# Patient Record
Sex: Female | Born: 1935
Health system: Southern US, Community
[De-identification: ages and names within clinical notes are randomized; demographics above are authoritative.]

## PROBLEM LIST (undated history)

## (undated) DIAGNOSIS — R112 Nausea with vomiting, unspecified: Secondary | ICD-10-CM

## (undated) DIAGNOSIS — I10 Essential (primary) hypertension: Secondary | ICD-10-CM

## (undated) DIAGNOSIS — K625 Hemorrhage of anus and rectum: Secondary | ICD-10-CM

## (undated) DIAGNOSIS — N318 Other neuromuscular dysfunction of bladder: Secondary | ICD-10-CM

## (undated) DIAGNOSIS — I712 Thoracic aortic aneurysm, without rupture, unspecified: Secondary | ICD-10-CM

## (undated) DIAGNOSIS — M159 Polyosteoarthritis, unspecified: Secondary | ICD-10-CM

## (undated) DIAGNOSIS — Z972 Presence of dental prosthetic device (complete) (partial): Secondary | ICD-10-CM

## (undated) DIAGNOSIS — Z973 Presence of spectacles and contact lenses: Secondary | ICD-10-CM

## (undated) DIAGNOSIS — I4819 Other persistent atrial fibrillation: Secondary | ICD-10-CM

## (undated) DIAGNOSIS — Z8709 Personal history of other diseases of the respiratory system: Secondary | ICD-10-CM

## (undated) DIAGNOSIS — R32 Unspecified urinary incontinence: Secondary | ICD-10-CM

## (undated) DIAGNOSIS — Z8489 Family history of other specified conditions: Secondary | ICD-10-CM

## (undated) DIAGNOSIS — Z9889 Other specified postprocedural states: Secondary | ICD-10-CM

## (undated) DIAGNOSIS — I495 Sick sinus syndrome: Secondary | ICD-10-CM

## (undated) DIAGNOSIS — K219 Gastro-esophageal reflux disease without esophagitis: Secondary | ICD-10-CM

## (undated) DIAGNOSIS — I272 Pulmonary hypertension, unspecified: Secondary | ICD-10-CM

## (undated) DIAGNOSIS — I2510 Atherosclerotic heart disease of native coronary artery without angina pectoris: Secondary | ICD-10-CM

## (undated) DIAGNOSIS — K851 Biliary acute pancreatitis without necrosis or infection: Secondary | ICD-10-CM

## (undated) DIAGNOSIS — C649 Malignant neoplasm of unspecified kidney, except renal pelvis: Secondary | ICD-10-CM

## (undated) DIAGNOSIS — E7800 Pure hypercholesterolemia, unspecified: Secondary | ICD-10-CM

## (undated) DIAGNOSIS — J309 Allergic rhinitis, unspecified: Secondary | ICD-10-CM

## (undated) HISTORY — DX: Polyosteoarthritis, unspecified: M15.9

## (undated) HISTORY — DX: Hemorrhage of anus and rectum: K62.5

## (undated) HISTORY — PX: TUBAL LIGATION: SHX77

## (undated) HISTORY — PX: ABDOMINAL HYSTERECTOMY: SHX81

## (undated) HISTORY — DX: Other neuromuscular dysfunction of bladder: N31.8

## (undated) HISTORY — DX: Biliary acute pancreatitis without necrosis or infection: K85.10

## (undated) HISTORY — DX: Other persistent atrial fibrillation: I48.19

## (undated) HISTORY — DX: Sick sinus syndrome: I49.5

## (undated) HISTORY — PX: COLONOSCOPY: SHX174

## (undated) HISTORY — DX: Thoracic aortic aneurysm, without rupture, unspecified: I71.20

## (undated) HISTORY — DX: Malignant neoplasm of unspecified kidney, except renal pelvis: C64.9

## (undated) HISTORY — DX: Thoracic aortic aneurysm, without rupture: I71.2

## (undated) HISTORY — PX: VEIN LIGATION: SHX2652

## (undated) HISTORY — PX: TOTAL ABDOMINAL HYSTERECTOMY W/ BILATERAL SALPINGOOPHORECTOMY: SHX83

## (undated) HISTORY — DX: Allergic rhinitis, unspecified: J30.9

## (undated) HISTORY — DX: Pulmonary hypertension, unspecified: I27.20

## (undated) HISTORY — DX: Pure hypercholesterolemia, unspecified: E78.00

## (undated) HISTORY — DX: Gastro-esophageal reflux disease without esophagitis: K21.9

## (undated) HISTORY — DX: Atherosclerotic heart disease of native coronary artery without angina pectoris: I25.10

## (undated) HISTORY — DX: Essential (primary) hypertension: I10

---

## 1999-01-07 ENCOUNTER — Ambulatory Visit (HOSPITAL_BASED_OUTPATIENT_CLINIC_OR_DEPARTMENT_OTHER): Admission: RE | Admit: 1999-01-07 | Discharge: 1999-01-07 | Payer: Self-pay | Admitting: Orthopedic Surgery

## 2000-07-26 ENCOUNTER — Encounter: Admission: RE | Admit: 2000-07-26 | Discharge: 2000-07-26 | Payer: Self-pay | Admitting: Family Medicine

## 2000-07-26 ENCOUNTER — Encounter: Payer: Self-pay | Admitting: Family Medicine

## 2001-07-19 ENCOUNTER — Encounter: Payer: Self-pay | Admitting: Family Medicine

## 2001-07-19 ENCOUNTER — Encounter: Admission: RE | Admit: 2001-07-19 | Discharge: 2001-07-19 | Payer: Self-pay | Admitting: Family Medicine

## 2002-03-01 ENCOUNTER — Encounter: Admission: RE | Admit: 2002-03-01 | Discharge: 2002-03-01 | Payer: Self-pay | Admitting: Family Medicine

## 2002-03-01 ENCOUNTER — Encounter: Payer: Self-pay | Admitting: Family Medicine

## 2003-03-18 ENCOUNTER — Encounter: Admission: RE | Admit: 2003-03-18 | Discharge: 2003-03-18 | Payer: Self-pay | Admitting: Family Medicine

## 2003-09-24 ENCOUNTER — Encounter: Admission: RE | Admit: 2003-09-24 | Discharge: 2003-09-24 | Payer: Self-pay | Admitting: Family Medicine

## 2004-09-12 ENCOUNTER — Encounter: Admission: RE | Admit: 2004-09-12 | Discharge: 2004-09-12 | Payer: Self-pay | Admitting: Family Medicine

## 2005-03-22 HISTORY — PX: THUMB ARTHROSCOPY: SHX2509

## 2005-03-22 HISTORY — PX: PACEMAKER INSERTION: SHX728

## 2005-03-22 HISTORY — PX: LAPAROSCOPIC CHOLECYSTECTOMY: SUR755

## 2005-03-25 ENCOUNTER — Inpatient Hospital Stay (HOSPITAL_COMMUNITY): Admission: AD | Admit: 2005-03-25 | Discharge: 2005-04-05 | Payer: Self-pay | Admitting: Internal Medicine

## 2005-03-25 ENCOUNTER — Encounter: Payer: Self-pay | Admitting: *Deleted

## 2005-03-29 ENCOUNTER — Encounter (INDEPENDENT_AMBULATORY_CARE_PROVIDER_SITE_OTHER): Payer: Self-pay | Admitting: Cardiology

## 2005-03-30 ENCOUNTER — Encounter (INDEPENDENT_AMBULATORY_CARE_PROVIDER_SITE_OTHER): Payer: Self-pay | Admitting: *Deleted

## 2005-05-18 ENCOUNTER — Ambulatory Visit (HOSPITAL_COMMUNITY): Admission: RE | Admit: 2005-05-18 | Discharge: 2005-05-18 | Payer: Self-pay | Admitting: Cardiology

## 2005-06-01 ENCOUNTER — Ambulatory Visit (HOSPITAL_COMMUNITY): Admission: RE | Admit: 2005-06-01 | Discharge: 2005-06-02 | Payer: Self-pay | Admitting: Cardiology

## 2005-07-12 ENCOUNTER — Encounter: Admission: RE | Admit: 2005-07-12 | Discharge: 2005-07-12 | Payer: Self-pay | Admitting: Family Medicine

## 2005-09-28 ENCOUNTER — Emergency Department (HOSPITAL_COMMUNITY): Admission: EM | Admit: 2005-09-28 | Discharge: 2005-09-28 | Payer: Self-pay | Admitting: Emergency Medicine

## 2006-01-18 ENCOUNTER — Ambulatory Visit (HOSPITAL_COMMUNITY): Admission: RE | Admit: 2006-01-18 | Discharge: 2006-01-18 | Payer: Self-pay | Admitting: Cardiology

## 2006-02-09 ENCOUNTER — Encounter: Admission: RE | Admit: 2006-02-09 | Discharge: 2006-02-09 | Payer: Self-pay | Admitting: Family Medicine

## 2006-06-02 ENCOUNTER — Inpatient Hospital Stay (HOSPITAL_COMMUNITY): Admission: AD | Admit: 2006-06-02 | Discharge: 2006-06-06 | Payer: Self-pay | Admitting: Cardiology

## 2008-03-21 ENCOUNTER — Encounter: Admission: RE | Admit: 2008-03-21 | Discharge: 2008-03-21 | Payer: Self-pay | Admitting: Family Medicine

## 2010-03-22 HISTORY — PX: OTHER SURGICAL HISTORY: SHX169

## 2010-04-03 ENCOUNTER — Inpatient Hospital Stay (HOSPITAL_COMMUNITY)
Admission: RE | Admit: 2010-04-03 | Discharge: 2010-04-06 | Payer: Self-pay | Source: Home / Self Care | Attending: Orthopedic Surgery | Admitting: Orthopedic Surgery

## 2010-04-06 LAB — DIFFERENTIAL
Basophils Absolute: 0 10*3/uL (ref 0.0–0.1)
Basophils Relative: 1 % (ref 0–1)
Eosinophils Absolute: 0.1 10*3/uL (ref 0.0–0.7)
Eosinophils Relative: 2 % (ref 0–5)
Lymphocytes Relative: 30 % (ref 12–46)
Lymphs Abs: 1.9 10*3/uL (ref 0.7–4.0)
Monocytes Absolute: 0.6 10*3/uL (ref 0.1–1.0)
Monocytes Relative: 9 % (ref 3–12)
Neutro Abs: 3.8 10*3/uL (ref 1.7–7.7)
Neutrophils Relative %: 59 % (ref 43–77)

## 2010-04-06 LAB — BASIC METABOLIC PANEL
BUN: 10 mg/dL (ref 6–23)
BUN: 12 mg/dL (ref 6–23)
CO2: 26 mEq/L (ref 19–32)
CO2: 26 mEq/L (ref 19–32)
Calcium: 8.7 mg/dL (ref 8.4–10.5)
Calcium: 9.1 mg/dL (ref 8.4–10.5)
Chloride: 107 mEq/L (ref 96–112)
Chloride: 108 mEq/L (ref 96–112)
Creatinine, Ser: 0.77 mg/dL (ref 0.4–1.2)
Creatinine, Ser: 0.8 mg/dL (ref 0.4–1.2)
GFR calc Af Amer: >60 mL/min (ref >60)
GFR calc Af Amer: >60 mL/min (ref >60)
GFR calc non Af Amer: >60 mL/min (ref >60)
GFR calc non Af Amer: >60 mL/min (ref >60)
Glucose, Bld: 133 mg/dL — ABNORMAL HIGH (ref 70–99)
Glucose, Bld: 115 mg/dL — ABNORMAL HIGH (ref 70–99)
Potassium: 4.1 mEq/L (ref 3.5–5.1)
Potassium: 3.5 mEq/L (ref 3.5–5.1)
Sodium: 138 mEq/L (ref 135–145)
Sodium: 139 mEq/L (ref 135–145)

## 2010-04-06 LAB — URINALYSIS, ROUTINE W REFLEX MICROSCOPIC
Bilirubin Urine: NEGATIVE
Bilirubin Urine: NEGATIVE
Ketones, ur: NEGATIVE mg/dL
Ketones, ur: NEGATIVE mg/dL
Nitrite: NEGATIVE
Nitrite: NEGATIVE
Protein, ur: NEGATIVE mg/dL
Protein, ur: NEGATIVE mg/dL
Specific Gravity, Urine: 1.015 (ref 1.005–1.030)
Specific Gravity, Urine: 1.009 (ref 1.005–1.030)
Urine Glucose, Fasting: NEGATIVE mg/dL
Urine Glucose, Fasting: NEGATIVE mg/dL
Urobilinogen, UA: 0.2 mg/dL (ref 0.0–1.0)
Urobilinogen, UA: 0.2 mg/dL (ref 0.0–1.0)
pH: 5.5 (ref 5.0–8.0)
pH: 6.5 (ref 5.0–8.0)

## 2010-04-06 LAB — CBC
HCT: 39.3 % (ref 36.0–46.0)
HCT: 30.5 % — ABNORMAL LOW (ref 36.0–46.0)
HCT: 27.9 % — ABNORMAL LOW (ref 36.0–46.0)
Hemoglobin: 13.6 g/dL (ref 12.0–15.0)
Hemoglobin: 10.5 g/dL — ABNORMAL LOW (ref 12.0–15.0)
Hemoglobin: 9.7 g/dL — ABNORMAL LOW (ref 12.0–15.0)
MCH: 31 pg (ref 26.0–34.0)
MCH: 31 pg (ref 26.0–34.0)
MCH: 31.1 pg (ref 26.0–34.0)
MCHC: 34.6 g/dL (ref 30.0–36.0)
MCHC: 34.4 g/dL (ref 30.0–36.0)
MCHC: 34.8 g/dL (ref 30.0–36.0)
MCV: 89.5 fL (ref 78.0–100.0)
MCV: 90 fL (ref 78.0–100.0)
MCV: 89.4 fL (ref 78.0–100.0)
Platelets: 127 10*3/uL — ABNORMAL LOW (ref 150–400)
Platelets: 120 10*3/uL — ABNORMAL LOW (ref 150–400)
Platelets: 105 10*3/uL — ABNORMAL LOW (ref 150–400)
RBC: 4.39 MIL/uL (ref 3.87–5.11)
RBC: 3.39 MIL/uL — ABNORMAL LOW (ref 3.87–5.11)
RBC: 3.12 MIL/uL — ABNORMAL LOW (ref 3.87–5.11)
RDW: 14.5 % (ref 11.5–15.5)
RDW: 14.5 % (ref 11.5–15.5)
RDW: 14.4 % (ref 11.5–15.5)
WBC: 6.4 10*3/uL (ref 4.0–10.5)
WBC: 10.6 10*3/uL — ABNORMAL HIGH (ref 4.0–10.5)
WBC: 10.2 10*3/uL (ref 4.0–10.5)

## 2010-04-06 LAB — PROTIME-INR
INR: 2.11 — ABNORMAL HIGH (ref 0.00–1.49)
INR: 1.05 (ref 0.00–1.49)
INR: 1.27 (ref 0.00–1.49)
INR: 1.57 — ABNORMAL HIGH (ref 0.00–1.49)
Prothrombin Time: 23.8 seconds — ABNORMAL HIGH (ref 11.6–15.2)
Prothrombin Time: 13.9 seconds (ref 11.6–15.2)
Prothrombin Time: 16.1 seconds — ABNORMAL HIGH (ref 11.6–15.2)
Prothrombin Time: 19 seconds — ABNORMAL HIGH (ref 11.6–15.2)

## 2010-04-06 LAB — COMPREHENSIVE METABOLIC PANEL
ALT: 13 U/L (ref 0–35)
AST: 18 U/L (ref 0–37)
Albumin: 3.9 g/dL (ref 3.5–5.2)
Alkaline Phosphatase: 65 U/L (ref 39–117)
BUN: 11 mg/dL (ref 6–23)
CO2: 25 mEq/L (ref 19–32)
Calcium: 9.5 mg/dL (ref 8.4–10.5)
Chloride: 110 mEq/L (ref 96–112)
Creatinine, Ser: 0.75 mg/dL (ref 0.4–1.2)
GFR calc Af Amer: >60 mL/min (ref >60)
GFR calc non Af Amer: >60 mL/min (ref >60)
Glucose, Bld: 99 mg/dL (ref 70–99)
Potassium: 3.8 mEq/L (ref 3.5–5.1)
Sodium: 141 mEq/L (ref 135–145)
Total Bilirubin: 1 mg/dL (ref 0.3–1.2)
Total Protein: 6.6 g/dL (ref 6.0–8.3)

## 2010-04-06 LAB — APTT
aPTT: 43 seconds — ABNORMAL HIGH (ref 24–37)
aPTT: 31 seconds (ref 24–37)

## 2010-04-06 LAB — SURGICAL PCR SCREEN
MRSA, PCR: NEGATIVE
Staphylococcus aureus: NEGATIVE

## 2010-04-06 LAB — URINE MICROSCOPIC-ADD ON

## 2010-04-06 LAB — TYPE AND SCREEN
ABO/RH(D): A POS
Antibody Screen: NEGATIVE

## 2010-04-06 LAB — ABO/RH: ABO/RH(D): A POS

## 2010-04-08 LAB — URINE CULTURE
Colony Count: >=100000
Culture  Setup Time: 201201160040

## 2010-04-08 LAB — BASIC METABOLIC PANEL
BUN: 13 mg/dL (ref 6–23)
CO2: 27 mEq/L (ref 19–32)
Calcium: 8.7 mg/dL (ref 8.4–10.5)
Chloride: 102 mEq/L (ref 96–112)
Creatinine, Ser: 0.84 mg/dL (ref 0.4–1.2)
GFR calc Af Amer: >60 mL/min (ref >60)
GFR calc non Af Amer: >60 mL/min (ref >60)
Glucose, Bld: 118 mg/dL — ABNORMAL HIGH (ref 70–99)
Potassium: 3.3 mEq/L — ABNORMAL LOW (ref 3.5–5.1)
Sodium: 134 mEq/L — ABNORMAL LOW (ref 135–145)

## 2010-04-08 LAB — CBC
HCT: 25.5 % — ABNORMAL LOW (ref 36.0–46.0)
Hemoglobin: 8.8 g/dL — ABNORMAL LOW (ref 12.0–15.0)
MCH: 30.7 pg (ref 26.0–34.0)
MCHC: 34.5 g/dL (ref 30.0–36.0)
MCV: 88.9 fL (ref 78.0–100.0)
Platelets: 103 10*3/uL — ABNORMAL LOW (ref 150–400)
RBC: 2.87 MIL/uL — ABNORMAL LOW (ref 3.87–5.11)
RDW: 14.4 % (ref 11.5–15.5)
WBC: 7.7 10*3/uL (ref 4.0–10.5)

## 2010-04-08 LAB — PROTIME-INR
INR: 2.09 — ABNORMAL HIGH (ref 0.00–1.49)
Prothrombin Time: 23.6 seconds — ABNORMAL HIGH (ref 11.6–15.2)

## 2010-05-05 NOTE — Discharge Summary (Signed)
Molly Taylor, Molly Taylor             ACCOUNT NO.:  [id number]  MEDICAL RECORD NO.:  [id number]          PATIENT TYPE:  INP  LOCATION:  5025                         FACILITY:  [facility]  PHYSICIAN:  Dyke Brackett, M.D.    DATE OF BIRTH:  November 19, 1926  DATE OF ADMISSION:  04/03/2010 DATE OF DISCHARGE:  04/06/2010                              DISCHARGE SUMMARY   DIAGNOSIS:  Left knee end-stage osteoarthritis.  SECONDARY DIAGNOSES: 1. Atrial fibrillation. 2. Hypertension. 3. History of pacemaker.  PROCEDURE IN HOSPITAL:  Left total knee arthroplasty.  HISTORY OF PRESENT ILLNESS:  The patient is an 77 year old with a long history of left knee pain.  Preoperative history and physical reveals her to have a history of knee pain for many years that had been treated with conservative measures including anti-inflammatories, steroid injection, physical therapy, rest, and pain medicines, none of which seemed to have been relieving her symptoms here in the last several months.  Her pain has worsened to the point where she is not ambulating safely and is interfering with daily living including sleep.  After discussing risks and benefits of the surgery, the patient wished to proceed with a total knee arthroplasty.  MEDICAL HISTORY:  Significant for history of atrial fibrillation and pacemaker, as well as hypertension.  SURGICAL HISTORY:  Positive for cholecystectomy in 2007, no difficulties with general endotracheal anesthesia.  REVIEW OF SYSTEMS:  Positive for cataracts, glasses, partial dentures, occasional palpitations, history of hemorrhoids, and easy bruising.  ADMISSION MEDICATIONS:  Warfarin which she takes on a chronic basis but discontinued 5 days prior to her admission, sotalol, loratadine, diltiazem, Centrum Silver, Tylenol, and cod liver oil.  ALLERGIES:  The patient is allergic to CODEINE which causes nausea.  SOCIAL HISTORY:  The patient does not use tobacco.  She does not  use alcohol.  She is married and lives with her husband in a one-story residence with several children and other family nearby.  FAMILY HISTORY:  Positive for hypertension and diabetes in her mother, as well as diabetes in 2 siblings.  PHYSICAL EXAMINATION:  VITAL SIGNS:  Preoperative history and physical showed her to have a temperature of 97.3, pulse of 61, blood pressure of 124/78. HEAD:  Normocephalic, atraumatic. EARS:  TMs clear. EYES: Pupils equal, round, and reactive to light and accommodation. NOSE:  Patent. THROAT:  Benign. NECK:  Supple.  Full range of motion.  No bruits. CHEST/LUNGS: Clear to auscultation and percussion. CARDIAC: Regular rate and rhythm. ABDOMEN:  Soft, nontender and nondistended.  Positive bowel sounds. CNS:  Within normal limits. SKIN:  Normal limits without rash. EXTREMITIES:  Examination of left knee shows no ligamentous laxity.  MCL and LCL were intact.  The patient has full range of motion about the hip.  Pain with all range of motion about the knee with positive crepitus.  PREOPERATIVE LABS:  CBC, CMET, EKG, PT and PTT, as well as chest x-ray were all within normal limits.  On her initial preoperative labs her PT and INR were high but came to within normal limits after stopping Coumadin for 5 days prior.  HOSPITAL COURSE:  On the day of  admission the patient was taken to the operating room at [facility] where she underwent a left total knee arthroplasty using Sigma DePuy cemented components, size 3 tibia, size 3 femur, 10-mm bearing, and 32-mm 3-peg poly.  Patient was placed on perioperative antibiotics.  She was placed on postoperative Coumadin, prophylaxis with Lovenox to bridge until she became therapeutic.  She was placed on DVT prophylaxis with mechanical measures as well including foot pumps and TED hose, as well as early ambulation and CPM use. Physical therapy was begun the first postoperative day and a process to have her placed  in a skilled bed which was her preference was begun immediately postoperatively.  On postoperative day #1 the patient was doing well.  The patient was without complaint other than moderate pain. Vitals were stable.  Hemoglobin of 10.5, WBC of 10.6.  Dressing was dry. Calf was soft and nontender.  Physical therapy continued to make slow but steady progress in transfers and ambulation.  Postoperative day 2 the patient was doing well.  Vital signs were stable.  Hemoglobin 9.7. Wound looked good on dressing change.  Staples were intact.  No sign of infection.  Hemovac was discontinued.  Calf was nontender and she was otherwise orthopedically stable and awaiting skilled nursing home placement.  IV and Foley were discontinued.  Coumadin was continued as she was not yet therapeutic and so Lovenox was continued one more day. Postoperative day 3 the patient was without complaint, was making good progress, was medically stable and orthopedically improved.  She was tolerating her CPM without difficulty and was making progress in learning safe ambulation and transfers but was still requiring assist on both.  Wound was clean and dry.  No sign of infection.  The patient's chest was clear to auscultation, she had good bowel sounds and had a small bowel movement the day before.  She was voiding without difficulty after having some minimal urinary retention immediately post Foley removal.  She was considered to be ready for transfer to a skilled nursing bed and a bed offer did become available at [facility] so she was transferred there on the third postoperative day.  At the time of her discharge her activities were weightbearing as tolerated, using a walker for all ambulation with assist as needed.  Physical therapy daily.  CPM 0 to 90 degrees for 6 to 8 hours per day.  Dressing change daily or as needed to keep the wound clean and dry.  She should maintain a dressing until she returns to see Dr.  Madelon Lips at the 2-week postoperative mark which will be on or about January 26th.  She should call [phone] for that appointment.  She should return sooner if her wound begins to drain, if it has increased pain that is not well relieved by pain medication, or if she has a temperature greater than 101.1.  DISCHARGE MEDICATIONS:  At the time of her discharge her medications are as follows: 1. Coumadin per pharmacy protocol with INR goals being 2 to 3. 2. Colace 100 mg b.i.d. 3. Claritin 10 mg p.o. daily. 4. Diltiazem 240 mg one p.o. daily. 5. Betapace 160 mg daily. 6. MiraLAX p.o. daily. 7. Robaxin 500 mg q.6 hours p.r.n. spasm. 8. Senokot or other laxative of choice as needed for constipation. 9. Fleet's enema as needed for constipation. 10.Percocet 5/325 mg tablets one p.o. q.4 hours p.r.n. pain, may     increase to 2 p.o. for severe pain q.4. 11.Zofran 4 mg p.o. q.6 hours  p.r.n. nausea. 12.Tylenol 325/650 mg p.o. minimal pain, not to be given in     conjunction with the Percocet. 13.Chloraseptic spray applied topically as needed for throat     irritation.  Once again, the diagnosis for this admission was end-stage osteoarthritis, and procedure in hospital was left total knee arthroplasty.  This is Skip Mayer dictating for [employer].     Laural Benes. Jannet Mantis   ______________________________ Dyke Brackett, M.D.    JBR/MEDQ  D:  04/06/2010  T:  04/06/2010  Job:  [id number]  Electronically Signed by Dan Humphreys P.A. on 04/21/2010 11:01:06 AM Electronically Signed by Lacretia Nicks. Delrose Rohwer M.D. on 05/05/2010 09:35:48 AM

## 2010-05-05 NOTE — Op Note (Signed)
  NAME:  Molly Taylor, Molly Taylor             ACCOUNT NO.:  [id number]  MEDICAL RECORD NO.:  [id number]          PATIENT TYPE:  INP  LOCATION:  5025                         FACILITY:  [facility]  PHYSICIAN:  Dyke Brackett, M.D.    DATE OF BIRTH:  1926-03-27  DATE OF PROCEDURE:  04/03/2010 DATE OF DISCHARGE:                              OPERATIVE REPORT   INDICATIONS:  An 77 year old with retractive knee pain, left knee OA, not responding to conservative treatment.  PREOPERATIVE DIAGNOSIS:  Left knee osteoarthritis.  POSTOPERATIVE DIAGNOSIS:  Left knee osteoarthritis.  OPERATION:  Sigma DePuy cemented knee with size 3 tibia femur, 10-mm bearing, 32-mm three-peg all-poly patella.  SURGEON:  Dyke Brackett, MD  ASSISTANT:  Laural Benes. Su Hilt, [place]  TOURNIQUET TIME:  One hour.  DESCRIPTION OF PROCEDURE:  Sterile prep and drape, exsanguination with 350 mmHg tourniquet, straight skin incision, medial parapatellar approach made through the knee.  There was relatively symmetric wear but slightly more on the lateral side.  Identified the most diseased lateral compartment after exposure and cut with about a 2-degree posterior slope about 4 mm below the most diseased lateral compartment.  Release of the ACL and PCL was carried out with careful attention to collateral ligaments.  Next, attention at the distal femur.  Distal femoral cut was done resecting 10 mm of femur, extension gap measured at 10 mm, eventually equalized at the flexion gap at 10, then sized the femur followed by the anterior and posterior chamfer cuts without difficulty. Once this was done, we next turned our attention to the tibia.  Tibia was sized to size 3, keel was placed in the tibia, and then trial tibial tray placed and the trial bearing.  Prior to this, we did cut the box out after cutting the tibia and trialing the tibia with the box for the Sigma knee, then placed both tibia and femoral components 2-mm bearing, no instability  noted.  Minimal drawer.  Good varus-valgus stability, full extension.  No tendency to bearing spin-out.  Left 14-15 mm of native patella after cutting it after size 32 patella.  Then trialed all components and again the excellent balance was noticed with the all parameters and acceptable relative flex extension varus-valgus instability, full extension.  Copious irrigation was carried out, inserted the prosthesis of tibia followed by femur patella, cemented with doughy cement.  Excess cement was removed.  Cement was allowed to harden.  We did have a trial bearing in, which was removed.  Tourniquet released.  No excess bleeding was noted at the knee.  No excess cement was noted at the knee posteriorly.  Small bleeders were coagulated.  Closure was effected with #1 Ethibond, 2-0 Vicryl, skin clips.  We did place a Hemovac drain exiting superolaterally.  At the completion, sterile dressing applied, taken to recovery room in stable condition.     Dyke Brackett, M.D.     WDC/MEDQ  D:  04/03/2010  T:  04/03/2010  Job:  [id number]  Electronically Signed by W. Izayah Miner M.D. on 05/05/2010 09:35:55 AM

## 2010-05-11 ENCOUNTER — Ambulatory Visit: Payer: Self-pay | Admitting: Physical Therapy

## 2010-05-13 ENCOUNTER — Ambulatory Visit: Payer: Medicare Other | Attending: Orthopedic Surgery | Admitting: Physical Therapy

## 2010-05-13 ENCOUNTER — Ambulatory Visit: Payer: Self-pay | Admitting: Physical Therapy

## 2010-05-13 DIAGNOSIS — M25569 Pain in unspecified knee: Secondary | ICD-10-CM | POA: Insufficient documentation

## 2010-05-13 DIAGNOSIS — Z96659 Presence of unspecified artificial knee joint: Secondary | ICD-10-CM | POA: Insufficient documentation

## 2010-05-13 DIAGNOSIS — R262 Difficulty in walking, not elsewhere classified: Secondary | ICD-10-CM | POA: Insufficient documentation

## 2010-05-13 DIAGNOSIS — IMO0001 Reserved for inherently not codable concepts without codable children: Secondary | ICD-10-CM | POA: Insufficient documentation

## 2010-05-13 DIAGNOSIS — M25669 Stiffness of unspecified knee, not elsewhere classified: Secondary | ICD-10-CM | POA: Insufficient documentation

## 2010-05-14 ENCOUNTER — Ambulatory Visit: Payer: Medicare Other | Admitting: Physical Therapy

## 2010-05-18 ENCOUNTER — Ambulatory Visit: Payer: Medicare Other | Admitting: Physical Therapy

## 2010-05-20 ENCOUNTER — Ambulatory Visit: Payer: Medicare Other | Admitting: Physical Therapy

## 2010-05-25 ENCOUNTER — Ambulatory Visit: Payer: Medicare Other | Attending: Orthopedic Surgery

## 2010-05-25 DIAGNOSIS — R262 Difficulty in walking, not elsewhere classified: Secondary | ICD-10-CM | POA: Insufficient documentation

## 2010-05-25 DIAGNOSIS — Z96659 Presence of unspecified artificial knee joint: Secondary | ICD-10-CM | POA: Insufficient documentation

## 2010-05-25 DIAGNOSIS — IMO0001 Reserved for inherently not codable concepts without codable children: Secondary | ICD-10-CM | POA: Insufficient documentation

## 2010-05-25 DIAGNOSIS — M25569 Pain in unspecified knee: Secondary | ICD-10-CM | POA: Insufficient documentation

## 2010-05-25 DIAGNOSIS — M25669 Stiffness of unspecified knee, not elsewhere classified: Secondary | ICD-10-CM | POA: Insufficient documentation

## 2010-05-27 ENCOUNTER — Ambulatory Visit: Payer: Medicare Other

## 2010-06-01 ENCOUNTER — Ambulatory Visit: Payer: Medicare Other | Admitting: Physical Therapy

## 2010-06-03 ENCOUNTER — Ambulatory Visit: Payer: Medicare Other | Admitting: Physical Therapy

## 2010-06-08 ENCOUNTER — Encounter: Payer: No Typology Code available for payment source | Admitting: Physical Therapy

## 2010-06-10 ENCOUNTER — Encounter: Payer: No Typology Code available for payment source | Admitting: Physical Therapy

## 2010-08-07 NOTE — Consult Note (Signed)
Molly Taylor, Molly Taylor             ACCOUNT NO.:  [id number]   MEDICAL RECORD NO.:  [id number]          PATIENT TYPE:  INP   LOCATION:  3701                         FACILITY:  [facility]   PHYSICIAN:  Leonie Man, M.D.   DATE OF BIRTH:  05-Sep-1926   DATE OF CONSULTATION:  03/29/2005  DATE OF DISCHARGE:                                   CONSULTATION   REQUESTING PHYSICIAN:  [facility] hospitalist, Dr. Rito Ehrlich.   PRIMARY CARE PHYSICIAN:  Dr. Lupe Carney.   CARDIOLOGIST:  Dr. Armanda Magic.   SURGEON:  Dr. Lurene Shadow.   REASON FOR CONSULTATION:  Biliary pancreatitis, acute cholecystitis.   HISTORY OF PRESENT ILLNESS:  Ms. Molly Taylor is a 77 year old female patient who  was admitted on March 25, 2005 after a prolonged of abdominal pain lasting  about 12 hours.  Initially, this pain started in the lower abdomen and  gradually radiated up through the abdomen and then through to the back,  associated with nausea, anorexia and reflux.  In the ER, the patient was  found to have mild leukocytosis, white count 13,300 with a left shift,  amylase was markedly elevated at greater than 2400 as well as was the lipase  at greater than 2000.  Her LFTs were also elevated with a total bilirubin of  1.8.  The patient relates that for the past several months she has had 1-2  hours of postprandial nausea with bloating and mild generalized abdominal  discomfort.  The symptoms she experienced prior to admission were the most  severe she has experienced.   Since admission, she has been n.p.o. and on IV pain medications.  Her lipase  has trended down to near normal.  In addition, she has experienced a  symptomatic bradycardia, prompting a cardiac evaluation and on March 27, 2005, she had an episode of sustained paroxysmal atrial tachycardia,  currently is maintaining sinus rhythm.  She is currently on IV heparin for  rule out MI protocol as well as for the atrial fibrillation and it is noted  by Cardiology that at  some point she will require chronic Coumadin  anticoagulation for CVA prophylaxis.  At the present time she is nausea- and  pain-free, sitting in the bed.   REVIEW OF SYSTEMS:  As per the HPI.  She does report she has had dizziness  this past summer with near-syncope.  she is troubled with chronic  constipation and occasionally uses low-dose Milk of Magnesia as needed.  She  has seasonal allergies.  She has not had any constitutional symptoms of  fevers, chills, myalgias or arthralgias; no upper respiratory infection  symptoms, no cough, no cold, no chest pain, no shortness of breath, no  dyspnea on exertion, no hematemesis, no hematuria, no dysuria, no melena or  hematochezia, no lower extremity swelling.   SOCIAL HISTORY:  She has never smoked.  She uses alcohol rarely on a social  basis.  She is married.  She has grown children that live in [place].   FAMILY MEDICAL HISTORY:  Both her mother, brothers and sisters have  diabetes.   PAST MEDICAL HISTORY:  1.  Hypertension, mild.  2.  Postmenopausal.  3.  Seasonal allergies.   PAST SURGICAL HISTORY:  1.  Hysterectomy.  2.  Varicose vein stripping.   ALLERGIES:  CODEINE, which causes nausea.   CURRENT MEDICATIONS:  Prior to admission, the patient was taking  hydrochlorothiazide, Claritin and Premarin.  While in the hospital, she is  currently on potassium, Protonix, heparin IV per pharmacy dosing, p.r.n.  morphine and Phenergan.  As of today, Medical Services have changed her  Protonix to p.o. and are keeping her IV fluids at low open rate.   PHYSICAL EXAMINATION:  GENERAL:  This is a pleasant female in no acute  distress or without any acute complaints at present time.  VITAL SIGNS:  Temperature 97.4 degrees, BP 108/76, heart rate 53 and  regular, respirations 20.  NEUROLOGIC:  The patient is alert and oriented x3, moving all extremities  x4.  No focal neurologic deficits.  HEENT:  Head is normocephalic.  Sclerae and  conjunctivae not injected.  NECK:  Neck supple without adenopathy.  CHEST:  Bilateral lung sounds are clear to auscultation posteriorly.  Respiratory effort is nonlabored.  She is on room air, saturating 97%.  HEART:  Heart sounds are S1 and S2, no rubs, murmurs, or thrills, no  gallops, bradycardic rate, no JVD, currently maintaining sinus rhythm on  telemetry.  ABDOMEN:  Abdomen is soft, nontender and non-distended, except for a mild  positive Murphy's sign in the right upper quadrant with deep palpation.  Bowel sounds are present and no obvious hepatosplenomegaly, no masses, no  bruits.  EXTREMITIES:  Extremities are symmetrical in appearance without edema,  cyanosis or clubbing.  Pulses are palpable; radial, femoral and pedal are 2+  bilaterally.   LABORATORY DATA UPON ADMISSION:  Total bilirubin was 1.8, AST 265, ALT 108,  amylase greater than 2400, lipase greater than 2000.  As of March 28, 2005,  total bilirubin was 1.0, AST down to 36, ALT 60 and lipase down to 65.  TSH  was 2.008.  White count has decreased to 6200, hemoglobin 12, hematocrit 35,  platelets 141,000.  Sodium 140, potassium 3.6, CO2 21, glucose 95, BUN 6,  creatinine 0.8.   DIAGNOSTICS:  EKG done on March 26, 2005 showed sinus bradycardia,  ventricular rate 48, QTc 453 msec.  EKG from March 27, 2005 shows atrial  fibrillation with a ventricular response of 61, QTc 471 msec.   Acute abdominal series on March 25, 2005 shows no free air, mild bibasilar  subsegmental atelectasis versus scar.   Abdominal ultrasound on March 25, 2005 shows gallstones and thickened  gallbladder wall, common bile duct nondilated.   CT of the abdomen done on March 25, 2005 shows infiltration of fat about  the gallbladder with pericholecystic fluid, no ductal dilatation, a few  small low-attenuation lesions of the liver and a right renal cyst, no  pancreatitis.  Two-dimensional echocardiogram and carotid Dopplers are done on  March 29, 2004 and are pending results.   IMPRESSION:  1.  Acute gallbladder/biliary pancreatitis, resolving.  2.  Acute cholecystitis.  3.  Asymptomatic bradycardia.  4.  Paroxysmal atrial fibrillation, currently maintaining sinus rhythm.  5.  Hypertension.   PLAN:  1.  We will continue modified bowel rest at present, n.p.o. if she is to      have surgery in the next 24 hours.  Internal Medicine has advanced diet      and she has been tolerating a regular low-fat, no-caffeine diet without  any recurrence of symptoms at this point.  They are also okaying the use      of caffeine.  2.  Pending discussion with Dr. Lurene Shadow regarding his schedule and the OR      schedule, the patient will probably proceed with elective laparoscopic      cholecystectomy within the next 24-48 hours; this probably needs to be      done this hospitalization, given the fact that Cardiology is      recommending long-term Coumadin anticoagulation for patient's underlying      paroxysmal atrial fibrillation for CVA prophylaxis.  3.  We need to discuss with Dr. Mayford Knife if the patient is otherwise okay from      a cardiology standpoint to proceed with laparoscopic cholecystectomy,      i.e., is it okay to proceed prior to insertion of permanent pacemaker?  4.  Continue IV fluid hydration to keep open rate, increase fluids and make      n.p.o. if re-develops nausea or abdominal pain with advance in diet.  5.  Follow up on echocardiogram preoperatively, especially regarding      regional wall motion abnormalities and EF percentage.  Follow up on      these issues if abnormal will be per Cardiology.      Allison L. Rennis Harding, N.P.      Leonie Man, M.D.  Electronically Signed    ALE/MEDQ  D:  03/29/2005  T:  03/30/2005  Job:  [id number]   cc:   L. Lupe Carney, M.D.  Fax: [phone]   Armanda Magic, M.D.  Fax: [phone]

## 2010-08-07 NOTE — Discharge Summary (Signed)
NAMECANDIE, Taylor             ACCOUNT NO.:  [id number]   MEDICAL RECORD NO.:  [id number]          PATIENT TYPE:  INP   LOCATION:  3701                         FACILITY:  [facility]   PHYSICIAN:  Jackie Plum, M.D.DATE OF BIRTH:  20-Dec-1926   DATE OF ADMISSION:  03/25/2005  DATE OF DISCHARGE:  04/05/2005                                 DISCHARGE SUMMARY   DISCHARGE DIAGNOSES:  1.  Gallstone pancreatitis/cholecystitis, resolved, status post laparoscopic      cholecystectomy with intraoperative cholangiogram.  2.  Paroxysmal atrial fibrillation started on chronic anticoagulation.  3.  Bradycardia, resolved.  4.  History of hypertension and allergies.  5.  Status post hysterectomy and varicose vein stripping.   DISCHARGE MEDICATIONS:  The patient will continue her preadmission  medications as previously.  These are:  1.  Hydrochlorothiazide 12.5 mg daily.  2.  Claritin 10 mg daily.  3.  Premarin daily.  New medications will be:  1.  Lovenox subcutaneous injection 120 mg daily while Coumadin is being      continued with subtherapeutic INR.  2.  Protonix 40 mg daily.  3.  Coumadin 2 mg tablets, three tablets, q.h.s.  4.  Vicodin 5/500 one to two tablets every 4 to 6 hours p.r.n. for pain.   FOLLOW UP PLANS:  The patient is to follow up with Dr. Armanda Magic on  April 19, 2005 at 3:45 p.m.  She is to go to Dr. Norris Cross office on  Wednesday, April 07, 2005 for a protime check at which time her dose of  Coumadin will be adjusted as determined by protime results.  The patient  will follow up with Dr. Lurene Shadow, she is to call for an appointment.   CONSULTANTS:  Dr. Armanda Magic of cardiology and Dr. Lurene Shadow, general  surgery.   PROCEDURES:  Cholecystectomy, as noted above.   CONDITION AT DISCHARGE:  Improved and satisfactory.   REASON FOR ADMISSION:  Acute pancreatitis.   The patient was admitted by Dr. Hollice Espy on March 25, 2005, after  presenting with abdominal  pain with nausea and vomiting.  She had had a few  months' history of occasional nausea and reflux.  There has been some severe  midepigastric pain radiating into her back.  On presentation to the  emergency room, the patient was noted to have significant transaminitis with  AST and ALT of 465 and 108, respectively and total bilirubin of 1.8.  Her  lipase level was more than 2000 and amylase was greater than 2400.  She is  admitted for acute pancreatitis deemed possibly related to gallstones.  Workup indeed confirmed gallstones with cholecystitis.  She was admitted  with regimen of bowel rest, analgesics and antibiotics.  The patient was  seen in consultation by general surgery and surgical intervention was  appropriately instituted without significant complications for the patient.  The patient's hospital course, however, was complicated by the development  of bradycardia with heart block, as well as paroxysms of atrial  fibrillation.  After review by Dr. Mayford Knife, the patient was deemed  inappropriate for beta blockade or calcium channel blockade because  of the  bradycardia.  She was also noted not to be a candidate for pacemaker  placement and anticoagulation was recommended.  Postoperatively, the patient  has done very well.  Her symptoms of dizziness have resolved, she had been  ambulating in the hospital on room air on the unit without any dizziness,  chest pain, shortness of breath.  However, her anticoagulation was continued  with some delay in achievement of therapeutic INR.  After further  discussion, the decision was made to discharge the patient home on Coumadin  with the Lovenox bridge and get her to be seen by [facility] for  general assessment and continued evaluations in terms of anticoagulation.  The patient is to be seen at Dr. Norris Cross office as listed above for protime  check at which time the dosage of Coumadin may be adjusted depending on the  INR level at that  time.   We had opportunity of meeting the patient and her two daughters as well as  her son-in-law and we discussed all the workup that was done for her, the  rationale behind this workup, as well as questions relating to Coumadin in  general and the need for significant caution to prevent any bleeding.  The  patient did not have any history of fever, chills, shortness of breath,  chest pain, headaches, nausea, vomiting, abdominal pain.  She has been  eating without any problems.  At this time, the blood pressure was 126/68,  oxygen saturation was 94% on room air, her temperature was 97.3 degrees F.  and respiratory rate was 18 per minute.  Her radial pulse was 52 per minute  by me.  Her cardiopulmonary auscultation revealed no crackles or wheezes.  She did not have any gallops.  There was no tachycardia.  Her heart rate was  borderline bordering on mild bradycardia.  Her abdomen was soft, nontender,  and bowel sounds present.  Skin exam revealed marked areas of bruising  around her surgical area.  The abdomen was soft, nontender.  Extremities:  No cyanosis, no cords.  She was alert and oriented x3, no acute focal  deficit.   DISCHARGE LABORATORY DATA:  WBC count of 6.1, hemoglobin 11.1, hematocrit  32.3, platelet count of 146.  (The patient's platelet count has run as low  as 122, but steadily had been increasing since then and she will need  outpatient follow up of her mild thrombocytopenia, which is nearly resolved  at this time).  Her protime was 20 and INR 1.7.  Sodium 138, potassium 3.5,  chloride 110, CO2 24, glucose 101, BUN 8, creatinine 0.8, calcium 8.9.  All  of these tests were done today, April 05, 2005.   The patient is discharged in stable satisfactory condition.  It was indeed a  pleasure to take care of this wonderful woman and total time spent in evaluation and management patient as well as preparation for discharge  among others was more than 38 minutes.       Jackie Plum, M.D.  Electronically Signed     GO/MEDQ  D:  04/05/2005  T:  04/05/2005  Job:  [id number]   cc:   L. Lupe Carney, M.D.  Fax: [phone]   Armanda Magic, M.D.  Fax: [phone]   Leonie Man, M.D.  [address]  [address]  [place]  [place] [address]

## 2010-08-07 NOTE — Op Note (Signed)
NAMEARIEL, DIMITRI             ACCOUNT NO.:  [id number]   MEDICAL RECORD NO.:  [id number]          PATIENT TYPE:  OIB   LOCATION:  6526                         FACILITY:  [facility]   PHYSICIAN:  Francisca December, M.D.  DATE OF BIRTH:  1927-01-28   DATE OF PROCEDURE:  06/01/2005  DATE OF DISCHARGE:  06/02/2005                                 OPERATIVE REPORT   PROCEDURES PERFORMED:  1.  Insertion dual chamber permanent transvenous pacemaker.  2.  Left subclavian venogram.   INDICATION:  77 year old woman with history of PAF with tachybrady syndrome  presents with intermittent bradycardia secondary to intermittent complete  heart block with ventricular escape.   PROCEDURE NOTE:  The patient was brought to the cardiac catheterization  laboratory in a fasting state. The left prepectoral region was prepped and  draped in usual sterile fashion. Local anesthesia was obtained with  infiltration of 1% lidocaine with epinephrine throughout the left  prepectoral region. Left subclavian venogram was then performed with a  peripheral injection of 20 mL of Omnipaque. A digital cineangiogram in the  AP projection was obtained and road mapped to guide future left subclavian  puncture. The venogram did demonstrate the left subclavian vein to be widely  patent and coursing in a normal fashion over the anterior surface of the  first rib and beneath the middle third of the clavicle. There was no  evidence for persistence of the left superior vena cava.   A 6-7 cm incision was then made in the deltopectoral groove and this was  carried down by sharp dissection and electrocautery to the prepectoral  fascia. There a plane was lifted and the pocket formed inferiorly and  medially. The pocket was then packed with 1% kanamycin soaked gauze. Two  separate left subclavian punctures were then performed using an 18 gauge  thin wall needle through which was passed a 0.038 inch tight J guidewire.  Over the  initial guidewire, a 7 French tearaway sheath and dilator were  advanced. The dilator and wire were removed and the ventricular lead was  advanced to the level of the right atrium. The sheath was torn away. Using  standard technique and fluoroscopic landmarks, the lead was manipulated in  the right ventricular apex. There excellent pacing parameters were obtained  as will be noted below. The lead was tested for diaphragmatic pacing at 10  volts and none was found. The lead was then sutured into place using three  separate 0 silk ligatures. Over the remaining guidewire, a 9 French tearaway  sheath and dilator were advanced. The dilator was removed and the wire was  allowed to remain in place. The atrial lead was advanced to the level of the  right atrium and sheath was torn away. Using standard technique and  fluoroscopic landmarks, the lead was manipulated into the right atrial  appendage. Again, there excellent pacing parameters were obtained as will be  noted below. The lead was tested for diaphragmatic pacing at 10 volts and  none was found.  The lead was then sutured into place using three separate 0  silk ligatures. The  remaining guidewire and the kanamycin soaked gauze were  removed from the pocket and the pocket was copiously irrigated using 1%  kanamycin solution. Each lead was then attached to the pacing generator,  carefully identifying each by its serial number, and placing each into the  appropriate receptacle. Each lead was tightened into place and tested for  security. The leads were then wound under the pacing generator and the  generator was placed in the pocket.  An 0 silk anchoring suture was then  applied, as well. The pocket was then inspected for bleeding and none was  found. The pocket was then closed using 2-0 Vicryl in a running fashion for  the subcutaneous layers. Two layers were applied. The skin was approximated  using 4-0 Vicryl in a running subcuticular fashion.  Steri-Strips and sterile  dressing were applied and the patient is transported to the recovery area in  stable condition in a sense V-paced mode.   EQUIPMENT DATA:  The pacing generator is a Medtronic EnRhythm, model number  [id number], serial number [id number].  The atrial lead is Medtronic model  number [id number], serial number [id number]. The ventricular lead is Medtronic  model number [id number], serial number [id number].   PACING DATA:  The ventricular lead detected an 11.2 mV R wave. The pacing  threshold was 0.5 volts at 0.5 milliseconds pulse width. The impedance was  638 ohms resulting in a current at capture threshold was 0.8 MA. The atrial  lead detected a 2.9 mV R P wave. The pacing threshold was 1 volt at 0.5  milliseconds. The impedance was 625 ohms resulting in a current at capture  threshold of 2.0 MA.      Francisca December, M.D.  Electronically Signed     JHE/MEDQ  D:  06/01/2005  T:  06/02/2005  Job:  [id number]   cc:   L. Lupe Carney, M.D.  Fax: [phone]

## 2010-08-07 NOTE — Discharge Summary (Signed)
[name], CURE             ACCOUNT NO.:  [id number]   MEDICAL RECORD NO.:  [id number]          PATIENT TYPE:  INP   LOCATION:  3708                         FACILITY:  [facility]   PHYSICIAN:  Armanda Magic, M.D.     DATE OF BIRTH:  1926-07-26   DATE OF ADMISSION:  06/02/2006  DATE OF DISCHARGE:  06/06/2006                               DISCHARGE SUMMARY   DISCHARGE DIAGNOSES:  1. Paroxysmal atrial fibrillation.  2. Sotalol load.  3. Tachy/brady syndrome status post permanent pacemaker.  4. Therapeutic anticoagulation, Coumadin.  5. Hypertension controlled.   HOSPITAL COURSE:  Molly Taylor is a 77 year old female who was  admitted to [facility] on June 02, 2006 with a known history  of paroxysmal atrial fibrillation.  She has tachy/brady syndrome and  ended up having a permanent pacemaker.  She was admitted on June 02, 2006 with a sotalol load.   She was kept in the hospital until June 06, 2006 and was found to be  ready for discharge to home.  Her QTC was stable at around 539.  She  remains in atrial fibrillation.   LAB STUDIES:  Sodium 140, potassium 3.5, BUN 14, creatinine 0.83.  LFTs  normal.  Pro-time 23.3, INR 2.0.   Chest x-ray:  Nonacute.   DISCHARGE MEDICATIONS:  1. Coumadin as prior to admission.  2. Premarin 0.625 mg a day.  3. Hydrochlorothiazide 25 mg one-half tablet daily.  4. Claritin p.r.n.  5. Centrum as needed.  6. Tylenol as needed.  7. Sotalol 80 mg p.o. twice a day.   Low-sodium, heart healthy diet.  Increase activity slowly.  Follow up to  Dr. Norris Cross office on June 09, 2006 at 9:00 a.m. and to see her on  June 16, 2006 at 10:15 a.m.      [name], P.A.      Armanda Magic, M.D.  Electronically Signed    LB/MEDQ  D:  09/05/2006  T:  09/05/2006  Job:  [id number]   cc:   L. Lupe Carney, M.D.

## 2010-08-07 NOTE — Op Note (Signed)
Molly Taylor, Molly Taylor             ACCOUNT NO.:  [id number]   MEDICAL RECORD NO.:  [id number]          PATIENT TYPE:  INP   LOCATION:  3701                         FACILITY:  [facility]   PHYSICIAN:  Leonie Man, M.D.   DATE OF BIRTH:  [birth year]   DATE OF PROCEDURE:  03/30/2005  DATE OF DISCHARGE:                                 OPERATIVE REPORT   PREOPERATIVE DIAGNOSIS:  Calculous cholecystitis, symptomatic.   POSTOPERATIVE DIAGNOSIS:  Calculous cholecystitis, symptomatic.   PROCEDURE:  Laparoscopic cholecystectomy and intraoperative cholangiogram.   SURGEON:  Leonie Man, M.D.   ASSISTANT:  Baldwin Crown, R.N.   ANESTHESIA:  General.   NOTE:  Molly Taylor is a 77 year old lady admitted with right upper quadrant  abdominal pain associated with nausea and with vomiting.  The patient on  workup was noted to have cholelithiasis.  She also had elevated amylase,  which was consistent with a biliary pancreatitis.  She comes to the  operating room now after she had been treating for bradycardia, atrial  fibrillation with heparin, and the heparin was discontinued approximately  three hours prior to surgery.  The patient been fully informed of the risks  and potential benefits of surgery, and she gives her consent.   PROCEDURE:  Following induction of satisfactory general anesthesia, the  patient positioned supinely, and the abdomen is prepped and draped to be  included in a sterile operative field.  Open laparoscopy created at the  umbilicus with insufflation of the peritoneal cavity to 14 mmHg using carbon  dioxide.  A camera was inserted and visual exploration noted.  The liver  edges were smooth with the exception of a very large hepatic cyst right at  the region of the falciform ligament, which appeared entirely benign.  The  gallbladder was very large and chronically scarred with multiple adhesions.  None of the small or large intestine viewed appeared to be abnormal.   Under  direct vision, epigastric and lateral ports were placed and the  gallbladder was grasped and retracted cephalad.  Dissection is carried down,  removing all the omental adhesions, to the hepatoduodenal ligament at the  ampulla of the gallbladder, where the cystic artery and cystic duct were  exposed.  The cystic artery was traced up to its entry into the gallbladder  wall and the cystic duct was traced to its entry into the gallbladder.  The  cystic artery was doubly clipped and transected.  The cystic duct was  clipped proximally and opened.  Cystic duct cholangiogram carried out by  passing a Cook catheter into the cystic duct and injecting one half-strength  Hypaque into the biliary system under fluoroscopic guidance.  The resulting  cholangiogram showed free flow of contrast into the duodenum, no filling  defects and normal hepatic radicles.  The cholangiocatheter was removed and  the cystic duct triply clipped and transected then.  The gallbladder was  then serially dissected free from the liver bed using electrocautery and  maintaining hemostasis throughout the entire course of the dissection.  At  the end of dissection, the liver bed was then thoroughly checked for  hemostasis, additional bleeding points treated with electrocautery.  The  right upper quadrant was then thoroughly irrigated with normal saline, and  the gallbladder was placed in an Endopouch and retrieved through the  umbilical incision without difficulty.  Following irrigation, the trocars  were removed under direct vision after the sponge and instrument counts were  correct.  The incisions were then closed in layers as follows:  The  umbilical incision into layers with 0 Vicryl and 4-0 Monocryl, the  epigastric and lateral incisions  closed with 4-0 Monocryl, all incisions reinforced with Steri-Strips.  Sterile dressings applied.  Anesthetic reversed.  The patient removed from  the operating room to the recovery room  in stable condition.  She tolerated  the procedure well.      Leonie Man, M.D.  Electronically Signed     PB/MEDQ  D:  03/30/2005  T:  03/31/2005  Job:  [id number]

## 2010-08-07 NOTE — Consult Note (Signed)
Molly Taylor, Molly Taylor             ACCOUNT NO.:  [id number]   MEDICAL RECORD NO.:  [id number]          PATIENT TYPE:  INP   LOCATION:  3701                         FACILITY:  [facility]   PHYSICIAN:  Armanda Magic, M.D.     DATE OF BIRTH:  1926-12-03   DATE OF CONSULTATION:  03/26/2005  DATE OF DISCHARGE:                                   CONSULTATION   REFERRING PHYSICIAN:  Dr. Lupe Carney.   CHIEF COMPLAINT:  Bradycardia.   HISTORY OF PRESENT ILLNESS:  This is a 77 year old African American female  with a history of varicose veins, who was admitted to the emergency room  with several days of increasing abdominal and epigastric pain and was noted  to have an elevated white cell count of 13.3 with increased LFTs and a  lipase greater than 2000 with an amylase greater than 2400, consistent with  acute pancreatitis.  She was noted during her hospital stay to have  bradycardia with heart rates in the 40s to 50s in the hospital.  She  currently is completely asymptomatic with her bradycardia and we are asked  to consult by Dr. Rito Ehrlich for low heart rate.  In questioning the patient  further, she has had some problems with dizzy spells in the past, but most  significant back in the summer, where she had had a lot of problems with  dizzy spells, especially from going from sitting to standing.  She has had  no syncope or presyncope, but those spells seem to have significantly  decreased over the past several months.   PAST MEDICAL HISTORY:  1.  Varicose veins.  2.  Questionable history of hypertension.   PAST SURGICAL HISTORY:  1.  Status post hysterectomy.  2.  Status post vein stripping.   MEDICATIONS ON ADMISSION:  1.  Hydrochlorothiazide 12.5 mg a day.  2.  Premarin.  3.  Claritin 10 mg a day.   SOCIAL HISTORY:  She denies any tobacco or alcohol or IV drugs.   FAMILY HISTORY:  Significant for coronary artery disease and diabetes.   REVIEW OF SYSTEMS:  Positive for nausea and  vomiting.   PHYSICAL EXAMINATION:  VITAL SIGNS:  Blood pressure is 110/58, heart rate  52.  HEENT:  Benign.  NECK:  Neck is supple without lymphadenopathy.  There is a right carotid  artery bruit present.  LUNGS:  Lungs are clear to auscultation throughout.  HEART:  Regular rate and rhythm, no murmurs, rubs, or gallops, normal S1 and  S2.  ABDOMEN:  Abdomen is soft, nontender and non-distended with normoactive  bowel sounds.  No hepatosplenomegaly.  EXTREMITIES:  No edema.   LABORATORY DATA:  Sodium 141, potassium 3.6, chloride 101, bicarb 28, BUN 8,  creatinine 1, glucose 108, AST 125, ALT 129, total bilirubin 0.2.  White  cell count 13.3, hemoglobin 13.4, hematocrit 39.3, platelet count 142,000.   Abdominal CT positive for acute cholecystitis.   Telemetry showed sinus bradycardia at 50 beats per minute with 1 episode of  Mobitz type 1 second-degree AV block.   ASSESSMENT:  1.  Asymptomatic bradycardia with 1 episode  of Mobitz II type 2 second-      degree atrioventricular block.  2.  Pancreatitis questionably secondary to gallstones.  3.  Dizzy spells this past summer, question of whether related to      bradyarrhythmias versus carotid disease, given her right carotid bruit      on exam.  4.  Carotid artery bruit.   PLAN:  Check a TSH.  Keep potassium greater than 4.  Check carotid arterial  Dopplers.  Check a 2-D echocardiogram to rule out structural heart disease,  given history of dizzy spells, no indication at present for permanent  pacemaker insertion.  Will follow with you.      Armanda Magic, M.D.  Electronically Signed     TT/MEDQ  D:  03/29/2005  T:  03/30/2005  Job:  [id number]   cc:   L. Lupe Carney, M.D.  Fax: [phone]

## 2010-08-07 NOTE — H&P (Signed)
Molly Taylor, Molly Taylor             ACCOUNT NO.:  [id number]   MEDICAL RECORD NO.:  [id number]          PATIENT TYPE:  INP   LOCATION:  3708                         FACILITY:  [facility]   PHYSICIAN:  Armanda Magic, M.D.     DATE OF BIRTH:  [birth year]   DATE OF ADMISSION:  06/02/2006  DATE OF DISCHARGE:                              HISTORY & PHYSICAL   REASON FOR ADMISSION:  Sotalol load.   Molly Taylor is a 77 year old female with paroxysmal atrial fibrillation.  She also has tachybrady syndrome and has undergone permanent pacemaker  implantation.  She is also on long-term Coumadin therapy.  She is  admitted for Sotalol load for treatment of her atrial fibrillation.   PAST MEDICAL HISTORY:  As above.  She also has a history of:  1. Biliary pancreatitis.  2. Mild hypertension.  3. Seasonal allergies.  4. Post menopausal.  5. Status post hysterectomy.  6. Varicose vein stripping.   SOCIAL HISTORY:  No tobacco, alcohol or IV drug use.   FAMILY HISTORY:  Significant for coronary artery disease and diabetes.   ALLERGIES:  CODEINE CAUSES GI UPSET.   MEDICATIONS:  1. Coumadin.  2. Premarin 0.625 mg a day.  3. Hydrochlorothiazide 25 mg one-half tablet daily.  4. Claritin daily.  5. Centrum daily.  6. Tylenol p.r.n.   PHYSICAL EXAMINATION:  VITALS:  Blood pressure 110/70, pulse 60,  respirations 14.  HEENT:  Grossly normal.  No carotid or subclavian bruits.  No JVD or  thyromegaly.  Sclerae are clear.  Conjunctivae normal.  Nares without  drainage.  CHEST:  Clear to auscultation bilaterally.  No wheeze, no rhonchi.  HEART:  Irregular, but sometimes regular where she is paced.  ABDOMEN:  Benign.  Good bowel sounds.  Nontender, nondistended.  No  masses or bruits.  EXTREMITIES:  No lower extremity edema.  NEURO:  Cranial nerves grossly intact.  Normal mood and affect.   ASSESSMENT/PLAN:  1. Paroxysmal atrial fibrillation.  2. Tachybrady syndrome with history of permanent pacemaker.  3. Anticoagulation with long-term Coumadin use.  4. History of hypertension.  5. History of gallstone pancreatitis.  6. Seasonal allergies.  7. Status post hysterectomy, vein stripping, permanent pacemaker as      mentioned above.   The patient is admitted for a sotalol load.  We will obtain routine lab  work and serial EKGs and continue to monitor her closely over the next  72 hours.      [name], P.A.      Armanda Magic, M.D.  Electronically Signed    LB/MEDQ  D:  06/02/2006  T:  06/02/2006  Job:  [id number]   cc:   L. Lupe Carney, M.D.

## 2010-08-07 NOTE — H&P (Signed)
Molly Taylor, SCHERER             ACCOUNT NO.:  [id number]   MEDICAL RECORD NO.:  [id number]          PATIENT TYPE:  EMS   LOCATION:  ED                           FACILITY:  [facility]   PHYSICIAN:  Hollice Espy, M.D.DATE OF BIRTH:  03-18-1927   DATE OF ADMISSION:  03/25/2005  DATE OF DISCHARGE:                                HISTORY & PHYSICAL   PRIMARY CARE PHYSICIAN:  L. Lupe Carney, M.D.   CHIEF COMPLAINT:  Abdominal pain with nausea and vomiting.   HISTORY OF PRESENT ILLNESS:  The patient is a 77 year old African-American  female with a past medical history of varicose veins who presents to the  emergency room with several days of worsening abdominal discomfort with mid  epigastric burning.  She tells me that for the last few months she has had  episodes of occasional nausea, lessened appetite, and reflux.  But the, over  the last 12 hours, she started having severe mid epigastric pain radiating  to her back and generalized abdominal discomfort, nausea and vomiting.  She  came into the emergency room, and the patient was noted to have a white  count of 13.3 with an 84% shift.  More concerning were her elevated  transaminases of 265 and 108, and a total bilirubin of 1.8.  The patient had  an amylase and lipase ordered.  Concerning was a lipase level of greater  than 2000, amylase greater than 2400.  This is sufficient.  The patient has  pancreatitis possible related to gallstones, although her alkaline  phosphatase level was normal.  She underwent a right upper quadrant  ultrasound which showed gallstones, a thickened gallbladder wall, and a non-  dilated common bile duct.  In addition, an abdominal series showed no  evidence of any bowel obstruction or free intraperitoneal air.  The patient  was made NPO, and given Pepcid, morphine and Zofran.  She is doing much,  much better.  She denies any headaches, vision changes, dysphagia.  She  currently denies any chest pain,  shortness of breath or burning.  She does  complain of some mild generalized abdominal discomfort, but this is  significantly better than her abdominal pain earlier.  She denies any  constipation, diarrhea.  No recent weight loss.  No focal extremity  numbness, weakness or pain.   REVIEW OF SYSTEMS:  Otherwise negative.   PAST MEDICAL HISTORY:  1.  Hysterectomy.  2.  Varicose vein stripping.  3.  Minimal blood pressure issues.   MEDICATIONS:  1.  HCTZ 12.5 p.o. daily.  2.  Premarin one tablet p.o. daily.  3.  Claritin 10 mg p.o. daily.   ALLERGIES:  CODEINE.   SOCIAL HISTORY:  She denies any tobacco, alcohol or drug use.   FAMILY HISTORY:  Notable for CAD and diabetes.   PHYSICAL EXAMINATION:  VITAL SIGNS:  On admission, temperature of 97.4,  heart rate 59, blood pressure 159/76, respirations 22, O2 saturation 97% on  room air.  GENERAL:  The patient is alert and oriented x3, in no apparent distress.  HEENT:  Normocephalic and atraumatic.  Mucous membranes are moist.  She has  no carotid bruits.  HEART:  Regular rate and rhythm.  S1 and S2.  LUNGS:  Clear the auscultation bilaterally.  ABDOMEN:  Soft, nontender, slightly obese.  Decreased bowel sounds.  EXTREMITIES:  No clubbing, cyanosis, or edema.   LABORATORY WORK:  Sodium 140, potassium 3.9, chloride 107, bicarbonate 27,  BUN 15, creatinine 1, glucose 108, calcium 9.4.  White count 13.3 with an  84% shift.  Hemoglobin and hematocrit of 13.4 and 39.3.  MCV of 93, platelet  count 142.  UA shows small leukocyte esterase, 0-2 white cells, rare  bacteria.  LFT's are noted for an AST of 265, ALT 108, alkaline phosphatase  67, total bilirubin of 1.8, with an indirect of 1.  Lipase greater than  2000.  Amylase greater than 2400.   ASSESSMENT AND PLAN:  Pancreatitis.  Suspect that likely this is secondary  to gallstones.  However, with a normal alkaline phosphatase and a non-  dilated common bile duct, it is possible the  patient has already had a  passed gallstones versus this could be medication related.  I note that she  is on Premarin, which has been shown in some studies to cause pancreatitis,  although the patient has been on this for some time.  Will make the patient  NPO, start her on IV fluids, as well as medications for pain, reflux and  nausea.  Follow up labs in the morning.  In addition, will go ahead and  check a CT of the abdomen with contrast to evaluate her pancreas.   If the patient's symptoms worsen or her labs do not improve, will ask for GI  consult.      Hollice Espy, M.D.  Electronically Signed     SKK/MEDQ  D:  03/25/2005  T:  03/25/2005  Job:  [id number]   cc:   L. Lupe Carney, M.D.  Fax: [phone]

## 2010-08-07 NOTE — Discharge Summary (Signed)
NAMEELVINA, Taylor             ACCOUNT NO.:  [id number]   MEDICAL RECORD NO.:  [id number]          PATIENT TYPE:  OIB   LOCATION:  6526                         FACILITY:  [facility]   PHYSICIAN:  Francisca December, M.D.  DATE OF BIRTH:  Jun 25, 1926   DATE OF ADMISSION:  06/01/2005  DATE OF DISCHARGE:  06/02/2005                                 DISCHARGE SUMMARY   DISCHARGE DIAGNOSES:  1.  Tachybrady syndrome with recent pacemaker implantation on June 01, 2005      by Dr. Corliss Marcus.  2.  Paroxysmal atrial fibrillation.  3.  Hypertension.  4.  Systemic anticoagulation.  5.  Gallstone pancreatitis history.  6.  Varicose veins.  7.  Multiple medication use.  8.  Postmenopausal.  9.  Long-term Coumadin therapy.  10. Status post hysterectomy.  11. Status post vein stripping.  12. Status post cholecystectomy.   HOSPITAL COURSE:  Molly Taylor is a 77 year old female with a history of PAF  with tachybrady syndrome.  She has had this known problem for several months  but has become symptomatic.  Because of the PAF, atrial tachycardia and  bradycardia, she was felt to clearly benefit from a permanent pacemaker  implantation.  She was scheduled for implantation on June 01, 2005, and  this device, Medtronic dual-chamber pacemaker, was implanted by Dr. Corliss Marcus in the left anterior chest without difficulty.  The patient remained  in the hospital overnight.  Her chest x-ray was normal and showed no  pneumothorax.   DISPOSITION:  She was discharged to home in stable condition.   DISCHARGE MEDICATIONS:  1.  Centrum Silver daily.  2.  Claritin daily.  3.  Hydrochlorothiazide 25 mg one-half tablet daily.  4.  Premarin 0.625 mg one tablet daily.  5.  Coumadin as prior to admission.   FOLLOW UP:  1.  She is to follow up with the Coumadin clinic on June 07, 2005 at 1:45      p.m.  2.  She is to follow up for a wound check on June 11, 2005 at 1:40 p.m.      with Dr. Amil Amen.   DISCHARGE INSTRUCTIONS:  She is to call for any left anterior chest wall  swelling or pain at the incision site.  I have given her a supplemental  discharge instruction sheet for permanent pacemaker, including wound care  and activity.      Molly Taylor, P.A.      Francisca December, M.D.  Electronically Signed    LB/MEDQ  D:  06/02/2005  T:  06/03/2005  Job:  [id number]   cc:   Armanda Magic, M.D.  Fax: [phone]

## 2011-03-16 ENCOUNTER — Encounter: Payer: Self-pay | Admitting: *Deleted

## 2011-03-16 ENCOUNTER — Emergency Department (HOSPITAL_COMMUNITY)
Admission: EM | Admit: 2011-03-16 | Discharge: 2011-03-16 | Disposition: A | Discharge status: Home / Self Care | Payer: Medicare Other | Attending: Emergency Medicine | Admitting: Emergency Medicine

## 2011-03-16 DIAGNOSIS — R350 Frequency of micturition: Secondary | ICD-10-CM | POA: Insufficient documentation

## 2011-03-16 DIAGNOSIS — Z79899 Other long term (current) drug therapy: Secondary | ICD-10-CM | POA: Insufficient documentation

## 2011-03-16 DIAGNOSIS — N390 Urinary tract infection, site not specified: Secondary | ICD-10-CM

## 2011-03-16 DIAGNOSIS — Z7901 Long term (current) use of anticoagulants: Secondary | ICD-10-CM | POA: Insufficient documentation

## 2011-03-16 LAB — URINALYSIS, ROUTINE W REFLEX MICROSCOPIC
Bilirubin Urine: NEGATIVE
Ketones, ur: NEGATIVE mg/dL
Specific Gravity, Urine: 1.004 — ABNORMAL LOW (ref 1.005–1.030)
Urobilinogen, UA: 0.2 mg/dL (ref 0.0–1.0)
pH: 6 (ref 5.0–8.0)

## 2011-03-16 LAB — GLUCOSE, CAPILLARY: Glucose-Capillary: 96 mg/dL (ref 70–99)

## 2011-03-16 MED ORDER — CEPHALEXIN 500 MG PO CAPS: 500.0000 mg | ORAL_CAPSULE | Freq: Four times a day (QID) | ORAL | Status: AC

## 2011-03-16 MED ORDER — CEPHALEXIN 250 MG PO CAPS
500.0000 mg | ORAL_CAPSULE | Freq: Four times a day (QID) | ORAL | Status: DC
  Administered 2011-03-16: 500 mg via ORAL
  Filled 2011-03-16: qty 2

## 2011-03-16 NOTE — ED Provider Notes (Signed)
Medical screening examination/treatment/procedure(s) were performed by non-physician practitioner and as supervising physician I was immediately available for consultation/collaboration.   Dayton Bailiff, MD 03/16/11 [phone]

## 2011-03-16 NOTE — ED Provider Notes (Signed)
History     CSN: [id number]  Arrival date & time 03/16/11  1521   First MD Initiated Contact with Patient 03/16/11 1605      Chief Complaint  Patient presents with  . uti symptoms     (Consider location/radiation/quality/duration/timing/severity/associated sxs/prior treatment) Patient is a 77 y.o. female presenting with frequency. The history is provided by the patient.  Urinary Frequency This is a new problem. The current episode started in the past 7 days. The problem has been gradually worsening. Associated symptoms include urinary symptoms. Pertinent negatives include no abdominal pain, chills, fever, myalgias or nausea. Associated symptoms comments: She reports increasing frequency of urination with worst symptoms today. . The symptoms are aggravated by nothing. She has tried nothing for the symptoms.    No past medical history on file.  No past surgical history on file.  No family history on file.  History  Substance Use Topics  . Smoking status: Never Smoker   . Smokeless tobacco: Not on file  . Alcohol Use: No    OB History    Grav Para Term Preterm Abortions TAB SAB Ect Mult Living                  Review of Systems  Constitutional: Negative for fever and chills.  Respiratory: Negative.   Cardiovascular: Negative.   Gastrointestinal: Negative.  Negative for nausea and abdominal pain.  Genitourinary: Positive for frequency.  Musculoskeletal: Negative.  Negative for myalgias.  Skin: Negative.   Neurological: Negative.     Allergies  Review of patient's allergies indicates no known allergies.  Home Medications   Current Outpatient Rx  Name Route Sig Dispense Refill  . ACETAMINOPHEN 650 MG RE SUPP Rectal Place 650 mg rectally every 4 (four) hours as needed. For pain     . COD LIVER OIL PO Oral Take 1 tablet by mouth daily.      Marland Kitchen DILTIAZEM HCL ER 240 MG PO CP24 Oral Take 240 mg by mouth daily.      Marland Kitchen LORATADINE 10 MG PO TABS Oral Take 10 mg by mouth  daily.      Marland Kitchen METOPROLOL TARTRATE 25 MG PO TABS Oral Take 25 mg by mouth 2 (two) times daily.      . CENTRUM PO CHEW Oral Chew 1 tablet by mouth daily.      . OXYBUTYNIN CHLORIDE ER 5 MG PO TB24 Oral Take 5 mg by mouth daily.      Marland Kitchen SOTALOL HCL 160 MG PO TABS Oral Take 160 mg by mouth 2 (two) times daily.      . WARFARIN SODIUM 6 MG PO TABS Oral Take 6 mg by mouth daily.        BP 126/80  Pulse 75  Temp 98.3 F (36.8 C)  Resp 20  SpO2 95%  Physical Exam  Constitutional: She is oriented to person, place, and time. She appears well-developed and well-nourished.  Neck: Normal range of motion.  Pulmonary/Chest: Effort normal.  Abdominal: Soft. Bowel sounds are normal. There is no tenderness.  Neurological: She is alert and oriented to person, place, and time.  Skin: Skin is warm and dry.    ED Course  Procedures (including critical care time)  Labs Reviewed  URINALYSIS, ROUTINE W REFLEX MICROSCOPIC - Abnormal; Notable for the following:    APPearance TURBID (*)    Specific Gravity, Urine 1.004 (*)    Hgb urine dipstick LARGE (*)    Protein, ur 30 (*)  Leukocytes, UA LARGE (*)    All other components within normal limits  URINE MICROSCOPIC-ADD ON  POCT CBG MONITORING   No results found.   No diagnosis found.    MDM          Rodena Medin, PA 03/16/11 [phone]

## 2011-03-16 NOTE — ED Notes (Signed)
Patient here with c/o frequency of urination.

## 2011-03-30 DIAGNOSIS — I4891 Unspecified atrial fibrillation: Secondary | ICD-10-CM | POA: Diagnosis not present

## 2011-03-30 DIAGNOSIS — Z7901 Long term (current) use of anticoagulants: Secondary | ICD-10-CM | POA: Diagnosis not present

## 2011-04-30 DIAGNOSIS — Z7901 Long term (current) use of anticoagulants: Secondary | ICD-10-CM | POA: Diagnosis not present

## 2011-04-30 DIAGNOSIS — I4891 Unspecified atrial fibrillation: Secondary | ICD-10-CM | POA: Diagnosis not present

## 2011-05-10 DIAGNOSIS — Z950 Presence of cardiac pacemaker: Secondary | ICD-10-CM | POA: Diagnosis not present

## 2011-05-10 DIAGNOSIS — I495 Sick sinus syndrome: Secondary | ICD-10-CM | POA: Diagnosis not present

## 2011-06-08 DIAGNOSIS — Z1231 Encounter for screening mammogram for malignant neoplasm of breast: Secondary | ICD-10-CM | POA: Diagnosis not present

## 2011-06-17 DIAGNOSIS — Z7901 Long term (current) use of anticoagulants: Secondary | ICD-10-CM | POA: Diagnosis not present

## 2011-06-17 DIAGNOSIS — Z950 Presence of cardiac pacemaker: Secondary | ICD-10-CM | POA: Diagnosis not present

## 2011-06-17 DIAGNOSIS — I4891 Unspecified atrial fibrillation: Secondary | ICD-10-CM | POA: Diagnosis not present

## 2011-06-17 DIAGNOSIS — I10 Essential (primary) hypertension: Secondary | ICD-10-CM | POA: Diagnosis not present

## 2011-06-17 DIAGNOSIS — I495 Sick sinus syndrome: Secondary | ICD-10-CM | POA: Diagnosis not present

## 2011-07-29 DIAGNOSIS — I4891 Unspecified atrial fibrillation: Secondary | ICD-10-CM | POA: Diagnosis not present

## 2011-07-29 DIAGNOSIS — Z7901 Long term (current) use of anticoagulants: Secondary | ICD-10-CM | POA: Diagnosis not present

## 2011-08-07 DIAGNOSIS — J209 Acute bronchitis, unspecified: Secondary | ICD-10-CM | POA: Diagnosis not present

## 2011-08-10 DIAGNOSIS — M1710 Unilateral primary osteoarthritis, unspecified knee: Secondary | ICD-10-CM | POA: Diagnosis not present

## 2011-08-17 DIAGNOSIS — J209 Acute bronchitis, unspecified: Secondary | ICD-10-CM | POA: Diagnosis not present

## 2011-08-19 DIAGNOSIS — M1710 Unilateral primary osteoarthritis, unspecified knee: Secondary | ICD-10-CM | POA: Diagnosis not present

## 2011-08-23 DIAGNOSIS — I4891 Unspecified atrial fibrillation: Secondary | ICD-10-CM | POA: Diagnosis not present

## 2011-08-23 DIAGNOSIS — Z950 Presence of cardiac pacemaker: Secondary | ICD-10-CM | POA: Diagnosis not present

## 2011-08-26 DIAGNOSIS — M1710 Unilateral primary osteoarthritis, unspecified knee: Secondary | ICD-10-CM | POA: Diagnosis not present

## 2011-09-24 DIAGNOSIS — I4891 Unspecified atrial fibrillation: Secondary | ICD-10-CM | POA: Diagnosis not present

## 2011-09-24 DIAGNOSIS — Z7901 Long term (current) use of anticoagulants: Secondary | ICD-10-CM | POA: Diagnosis not present

## 2011-10-29 DIAGNOSIS — I4891 Unspecified atrial fibrillation: Secondary | ICD-10-CM | POA: Diagnosis not present

## 2011-10-29 DIAGNOSIS — Z7901 Long term (current) use of anticoagulants: Secondary | ICD-10-CM | POA: Diagnosis not present

## 2011-11-19 DIAGNOSIS — Z7901 Long term (current) use of anticoagulants: Secondary | ICD-10-CM | POA: Diagnosis not present

## 2011-11-19 DIAGNOSIS — I4891 Unspecified atrial fibrillation: Secondary | ICD-10-CM | POA: Diagnosis not present

## 2011-11-29 DIAGNOSIS — I495 Sick sinus syndrome: Secondary | ICD-10-CM | POA: Diagnosis not present

## 2011-11-29 DIAGNOSIS — Z950 Presence of cardiac pacemaker: Secondary | ICD-10-CM | POA: Diagnosis not present

## 2011-12-23 DIAGNOSIS — I495 Sick sinus syndrome: Secondary | ICD-10-CM | POA: Diagnosis not present

## 2011-12-23 DIAGNOSIS — I10 Essential (primary) hypertension: Secondary | ICD-10-CM | POA: Diagnosis not present

## 2011-12-23 DIAGNOSIS — Z950 Presence of cardiac pacemaker: Secondary | ICD-10-CM | POA: Diagnosis not present

## 2011-12-23 DIAGNOSIS — Z7901 Long term (current) use of anticoagulants: Secondary | ICD-10-CM | POA: Diagnosis not present

## 2011-12-23 DIAGNOSIS — I4891 Unspecified atrial fibrillation: Secondary | ICD-10-CM | POA: Diagnosis not present

## 2012-01-05 DIAGNOSIS — I4891 Unspecified atrial fibrillation: Secondary | ICD-10-CM | POA: Diagnosis not present

## 2012-01-05 DIAGNOSIS — I10 Essential (primary) hypertension: Secondary | ICD-10-CM | POA: Diagnosis not present

## 2012-01-05 DIAGNOSIS — R0602 Shortness of breath: Secondary | ICD-10-CM | POA: Diagnosis not present

## 2012-01-05 DIAGNOSIS — R05 Cough: Secondary | ICD-10-CM | POA: Diagnosis not present

## 2012-01-13 DIAGNOSIS — Z23 Encounter for immunization: Secondary | ICD-10-CM | POA: Diagnosis not present

## 2012-01-13 DIAGNOSIS — I10 Essential (primary) hypertension: Secondary | ICD-10-CM | POA: Diagnosis not present

## 2012-01-13 DIAGNOSIS — J309 Allergic rhinitis, unspecified: Secondary | ICD-10-CM | POA: Diagnosis not present

## 2012-01-13 DIAGNOSIS — N318 Other neuromuscular dysfunction of bladder: Secondary | ICD-10-CM | POA: Diagnosis not present

## 2012-01-13 DIAGNOSIS — M159 Polyosteoarthritis, unspecified: Secondary | ICD-10-CM | POA: Diagnosis not present

## 2012-01-19 DIAGNOSIS — Z950 Presence of cardiac pacemaker: Secondary | ICD-10-CM | POA: Diagnosis not present

## 2012-01-19 DIAGNOSIS — I10 Essential (primary) hypertension: Secondary | ICD-10-CM | POA: Diagnosis not present

## 2012-01-19 DIAGNOSIS — R0602 Shortness of breath: Secondary | ICD-10-CM | POA: Diagnosis not present

## 2012-01-19 DIAGNOSIS — R05 Cough: Secondary | ICD-10-CM | POA: Diagnosis not present

## 2012-01-19 DIAGNOSIS — I4891 Unspecified atrial fibrillation: Secondary | ICD-10-CM | POA: Diagnosis not present

## 2012-02-03 DIAGNOSIS — I4891 Unspecified atrial fibrillation: Secondary | ICD-10-CM | POA: Diagnosis not present

## 2012-02-03 DIAGNOSIS — Z7901 Long term (current) use of anticoagulants: Secondary | ICD-10-CM | POA: Diagnosis not present

## 2012-03-20 DIAGNOSIS — I4891 Unspecified atrial fibrillation: Secondary | ICD-10-CM | POA: Diagnosis not present

## 2012-03-20 DIAGNOSIS — Z7901 Long term (current) use of anticoagulants: Secondary | ICD-10-CM | POA: Diagnosis not present

## 2012-03-27 DIAGNOSIS — Z950 Presence of cardiac pacemaker: Secondary | ICD-10-CM | POA: Diagnosis not present

## 2012-04-03 DIAGNOSIS — Z950 Presence of cardiac pacemaker: Secondary | ICD-10-CM | POA: Diagnosis not present

## 2012-04-03 DIAGNOSIS — I4891 Unspecified atrial fibrillation: Secondary | ICD-10-CM | POA: Diagnosis not present

## 2012-04-03 DIAGNOSIS — I495 Sick sinus syndrome: Secondary | ICD-10-CM | POA: Diagnosis not present

## 2012-04-03 DIAGNOSIS — I10 Essential (primary) hypertension: Secondary | ICD-10-CM | POA: Diagnosis not present

## 2012-04-03 DIAGNOSIS — Z7901 Long term (current) use of anticoagulants: Secondary | ICD-10-CM | POA: Diagnosis not present

## 2012-05-01 DIAGNOSIS — Z7901 Long term (current) use of anticoagulants: Secondary | ICD-10-CM | POA: Diagnosis not present

## 2012-05-01 DIAGNOSIS — I4891 Unspecified atrial fibrillation: Secondary | ICD-10-CM | POA: Diagnosis not present

## 2012-05-01 DIAGNOSIS — Z950 Presence of cardiac pacemaker: Secondary | ICD-10-CM | POA: Diagnosis not present

## 2012-05-01 DIAGNOSIS — I495 Sick sinus syndrome: Secondary | ICD-10-CM | POA: Diagnosis not present

## 2012-05-22 DIAGNOSIS — I4891 Unspecified atrial fibrillation: Secondary | ICD-10-CM | POA: Diagnosis not present

## 2012-05-22 DIAGNOSIS — M25519 Pain in unspecified shoulder: Secondary | ICD-10-CM | POA: Diagnosis not present

## 2012-05-22 DIAGNOSIS — M25569 Pain in unspecified knee: Secondary | ICD-10-CM | POA: Diagnosis not present

## 2012-05-22 DIAGNOSIS — Z950 Presence of cardiac pacemaker: Secondary | ICD-10-CM | POA: Diagnosis not present

## 2012-06-08 DIAGNOSIS — Z1231 Encounter for screening mammogram for malignant neoplasm of breast: Secondary | ICD-10-CM | POA: Diagnosis not present

## 2012-06-09 DIAGNOSIS — I4891 Unspecified atrial fibrillation: Secondary | ICD-10-CM | POA: Diagnosis not present

## 2012-06-09 DIAGNOSIS — Z7901 Long term (current) use of anticoagulants: Secondary | ICD-10-CM | POA: Diagnosis not present

## 2012-06-23 DIAGNOSIS — I4891 Unspecified atrial fibrillation: Secondary | ICD-10-CM | POA: Diagnosis not present

## 2012-06-23 DIAGNOSIS — I10 Essential (primary) hypertension: Secondary | ICD-10-CM | POA: Diagnosis not present

## 2012-06-23 DIAGNOSIS — Z950 Presence of cardiac pacemaker: Secondary | ICD-10-CM | POA: Diagnosis not present

## 2012-06-23 DIAGNOSIS — I495 Sick sinus syndrome: Secondary | ICD-10-CM | POA: Diagnosis not present

## 2012-07-24 DIAGNOSIS — Z950 Presence of cardiac pacemaker: Secondary | ICD-10-CM | POA: Diagnosis not present

## 2012-07-25 DIAGNOSIS — Z7901 Long term (current) use of anticoagulants: Secondary | ICD-10-CM | POA: Diagnosis not present

## 2012-07-25 DIAGNOSIS — I4891 Unspecified atrial fibrillation: Secondary | ICD-10-CM | POA: Diagnosis not present

## 2012-07-31 DIAGNOSIS — Z950 Presence of cardiac pacemaker: Secondary | ICD-10-CM | POA: Diagnosis not present

## 2012-07-31 DIAGNOSIS — I4891 Unspecified atrial fibrillation: Secondary | ICD-10-CM | POA: Diagnosis not present

## 2012-08-11 DIAGNOSIS — M1710 Unilateral primary osteoarthritis, unspecified knee: Secondary | ICD-10-CM | POA: Diagnosis not present

## 2012-08-11 DIAGNOSIS — S43429A Sprain of unspecified rotator cuff capsule, initial encounter: Secondary | ICD-10-CM | POA: Diagnosis not present

## 2012-08-11 DIAGNOSIS — IMO0002 Reserved for concepts with insufficient information to code with codable children: Secondary | ICD-10-CM | POA: Diagnosis not present

## 2012-08-16 DIAGNOSIS — M25519 Pain in unspecified shoulder: Secondary | ICD-10-CM | POA: Diagnosis not present

## 2012-08-22 DIAGNOSIS — Z950 Presence of cardiac pacemaker: Secondary | ICD-10-CM | POA: Diagnosis not present

## 2012-08-22 DIAGNOSIS — I495 Sick sinus syndrome: Secondary | ICD-10-CM | POA: Diagnosis not present

## 2012-08-24 DIAGNOSIS — M25569 Pain in unspecified knee: Secondary | ICD-10-CM | POA: Diagnosis not present

## 2012-08-24 DIAGNOSIS — M25519 Pain in unspecified shoulder: Secondary | ICD-10-CM | POA: Diagnosis not present

## 2012-09-07 DIAGNOSIS — I4891 Unspecified atrial fibrillation: Secondary | ICD-10-CM | POA: Diagnosis not present

## 2012-09-07 DIAGNOSIS — Z7901 Long term (current) use of anticoagulants: Secondary | ICD-10-CM | POA: Diagnosis not present

## 2012-09-28 DIAGNOSIS — Z7901 Long term (current) use of anticoagulants: Secondary | ICD-10-CM | POA: Diagnosis not present

## 2012-09-28 DIAGNOSIS — I4891 Unspecified atrial fibrillation: Secondary | ICD-10-CM | POA: Diagnosis not present

## 2012-10-16 DIAGNOSIS — Z961 Presence of intraocular lens: Secondary | ICD-10-CM | POA: Diagnosis not present

## 2012-10-26 DIAGNOSIS — Z7901 Long term (current) use of anticoagulants: Secondary | ICD-10-CM | POA: Diagnosis not present

## 2012-10-26 DIAGNOSIS — I4891 Unspecified atrial fibrillation: Secondary | ICD-10-CM | POA: Diagnosis not present

## 2012-11-14 DIAGNOSIS — I495 Sick sinus syndrome: Secondary | ICD-10-CM | POA: Diagnosis not present

## 2012-11-14 DIAGNOSIS — Z950 Presence of cardiac pacemaker: Secondary | ICD-10-CM | POA: Diagnosis not present

## 2012-12-07 DIAGNOSIS — I4891 Unspecified atrial fibrillation: Secondary | ICD-10-CM | POA: Diagnosis not present

## 2012-12-07 DIAGNOSIS — Z7901 Long term (current) use of anticoagulants: Secondary | ICD-10-CM | POA: Diagnosis not present

## 2012-12-17 DIAGNOSIS — S0002XA Blister (nonthermal) of scalp, initial encounter: Secondary | ICD-10-CM | POA: Diagnosis not present

## 2012-12-20 ENCOUNTER — Ambulatory Visit (INDEPENDENT_AMBULATORY_CARE_PROVIDER_SITE_OTHER): Payer: Medicare Other | Admitting: *Deleted

## 2012-12-20 DIAGNOSIS — I495 Sick sinus syndrome: Secondary | ICD-10-CM

## 2012-12-20 LAB — PACEMAKER DEVICE OBSERVATION
ATRIAL PACING PM: 85.7
BAMS-0001: 170 {beats}/min

## 2012-12-20 NOTE — [place] Notes (Signed)
Pacemaker check in clinic. Normal device function. Thresholds, sensing, impedances consistent with previous measurements. Device programmed to maximize longevity. No high ventricular rates noted. Device programmed at appropriate safety margins. Histogram distribution appropriate for patient activity level. Device programmed to optimize intrinsic conduction. Battery voltage 2.64 V. Patient enrolled in remote follow-up and number was given to request cell adapter. [facility] 03-23-13 and ROV in March with GT. 

## 2012-12-27 ENCOUNTER — Ambulatory Visit: Payer: Medicare Other | Admitting: Cardiology

## 2013-01-03 ENCOUNTER — Other Ambulatory Visit: Payer: Self-pay | Admitting: Cardiology

## 2013-01-10 ENCOUNTER — Encounter: Payer: Self-pay | Admitting: Internal Medicine

## 2013-01-12 DIAGNOSIS — N318 Other neuromuscular dysfunction of bladder: Secondary | ICD-10-CM | POA: Diagnosis not present

## 2013-01-12 DIAGNOSIS — J309 Allergic rhinitis, unspecified: Secondary | ICD-10-CM | POA: Diagnosis not present

## 2013-01-12 DIAGNOSIS — M159 Polyosteoarthritis, unspecified: Secondary | ICD-10-CM | POA: Diagnosis not present

## 2013-01-12 DIAGNOSIS — Z23 Encounter for immunization: Secondary | ICD-10-CM | POA: Diagnosis not present

## 2013-01-12 DIAGNOSIS — I10 Essential (primary) hypertension: Secondary | ICD-10-CM | POA: Diagnosis not present

## 2013-01-18 ENCOUNTER — Ambulatory Visit: Payer: Medicare Other | Admitting: Cardiology

## 2013-01-18 ENCOUNTER — Ambulatory Visit (INDEPENDENT_AMBULATORY_CARE_PROVIDER_SITE_OTHER): Payer: Medicare Other | Admitting: Pharmacist

## 2013-01-18 DIAGNOSIS — I4891 Unspecified atrial fibrillation: Secondary | ICD-10-CM

## 2013-01-18 DIAGNOSIS — I4819 Other persistent atrial fibrillation: Secondary | ICD-10-CM | POA: Insufficient documentation

## 2013-01-18 LAB — POCT INR: INR: 2

## 2013-01-22 ENCOUNTER — Encounter: Payer: Self-pay | Admitting: Internal Medicine

## 2013-01-22 ENCOUNTER — Ambulatory Visit (INDEPENDENT_AMBULATORY_CARE_PROVIDER_SITE_OTHER): Payer: Medicare Other | Admitting: *Deleted

## 2013-01-22 DIAGNOSIS — Z45010 Encounter for checking and testing of cardiac pacemaker pulse generator [battery]: Secondary | ICD-10-CM

## 2013-01-22 DIAGNOSIS — Z45018 Encounter for adjustment and management of other part of cardiac pacemaker: Secondary | ICD-10-CM

## 2013-01-23 LAB — REMOTE PACEMAKER DEVICE
AL AMPLITUDE: 2.4 mv
BAMS-0001: 170 {beats}/min
BRDY-0004RA: 130 {beats}/min
VENTRICULAR PACING PM: 99.97

## 2013-01-25 ENCOUNTER — Ambulatory Visit: Payer: Medicare Other | Admitting: Cardiology

## 2013-01-26 ENCOUNTER — Encounter: Payer: Self-pay | Admitting: *Deleted

## 2013-01-29 NOTE — Addendum Note (Signed)
Addended by: Glenda Chroman on: 01/29/2013 11:34 AM   Modules accepted: Level of Service

## 2013-01-31 ENCOUNTER — Encounter: Payer: Self-pay | Admitting: *Deleted

## 2013-01-31 ENCOUNTER — Encounter: Payer: Self-pay | Admitting: Cardiology

## 2013-02-01 ENCOUNTER — Ambulatory Visit: Payer: Medicare Other | Admitting: Cardiology

## 2013-02-02 ENCOUNTER — Encounter: Payer: Self-pay | Admitting: Cardiology

## 2013-02-02 ENCOUNTER — Ambulatory Visit (INDEPENDENT_AMBULATORY_CARE_PROVIDER_SITE_OTHER): Payer: Medicare Other | Admitting: Cardiology

## 2013-02-02 VITALS — BP 124/70 | HR 65 | Ht 65.0 in | Wt 172.2 lb

## 2013-02-02 DIAGNOSIS — I4891 Unspecified atrial fibrillation: Secondary | ICD-10-CM

## 2013-02-02 DIAGNOSIS — I495 Sick sinus syndrome: Secondary | ICD-10-CM | POA: Insufficient documentation

## 2013-02-02 DIAGNOSIS — I10 Essential (primary) hypertension: Secondary | ICD-10-CM

## 2013-02-02 DIAGNOSIS — K851 Biliary acute pancreatitis without necrosis or infection: Secondary | ICD-10-CM | POA: Insufficient documentation

## 2013-02-02 DIAGNOSIS — Z7901 Long term (current) use of anticoagulants: Secondary | ICD-10-CM

## 2013-02-02 NOTE — Patient Instructions (Signed)
Your physician recommends that you continue on your current medications as directed. Please refer to the Current Medication list given to you today.  Your physician wants you to follow-up in: 6 Months with Dr Turner You will receive a reminder letter in the mail two months in advance. If you don't receive a letter, please call our office to schedule the follow-up appointment.  

## 2013-02-02 NOTE — [place] Notes (Signed)
[address] [place], [place]  [address] Phone: [phone] Fax:  [phone]  Date:  02/02/2013   ID:  Molly Taylor, DOB October 30, 1926, MRN [id number]  PCP:  No primary provider on file.  Cardiologist:  Armanda Magic, MD     History of Present Illness: Molly Taylor is a 79 y.o. female with a history of HTN, PAF, tachybrady syndrome s/p PPM and chronic systemic anticoagulation presents toady for followup.  She is doing well.  She denies any chest pain, SOB, DOE, LE edema, dizziness, palpitations or syncope.   Wt Readings from Last 3 Encounters:  02/02/13 172 lb 3.2 oz (78.109 kg)     Past Medical History  Diagnosis Date  . Allergic rhinitis, cause unspecified   . Hypercholesteremia   . GERD (gastroesophageal reflux disease)   . Hemorrhage of rectum and anus   . Generalized osteoarthrosis, unspecified site   . Hypertonicity of bladder   . HTN (hypertension)   . A-fib   . Tachy-brady syndrome     s/p PPM  . Chronic anticoagulation   . Pancreatitis, gallstone     Current Outpatient Prescriptions  Medication Sig Dispense Refill  . acetaminophen (TYLENOL) 650 MG suppository Place 650 mg rectally every 4 (four) hours as needed. For pain       . COD LIVER OIL PO Take 1 tablet by mouth daily.        Marland Kitchen diltiazem (DILACOR XR) 240 MG 24 hr capsule Take 240 mg by mouth daily.        Marland Kitchen loratadine (CLARITIN) 10 MG tablet Take 10 mg by mouth daily.        . metoprolol succinate (TOPROL-XL) 25 MG 24 hr tablet take 1 tablet by mouth twice a day  60 tablet  6  . metoprolol tartrate (LOPRESSOR) 25 MG tablet Take 25 mg by mouth 2 (two) times daily.        . multivitamin-iron-minerals-folic acid (CENTRUM) chewable tablet Chew 1 tablet by mouth daily.        . sotalol (BETAPACE) 160 MG tablet Take 160 mg by mouth 2 (two) times daily.        Marland Kitchen warfarin (COUMADIN) 6 MG tablet Take 6 mg by mouth daily.        Marland Kitchen oxybutynin (DITROPAN-XL) 5 MG 24 hr tablet Take 5 mg by mouth  daily.         No current facility-administered medications for this visit.    Allergies:    Allergies  Allergen Reactions  . Codeine   . Sulfa Antibiotics     Social History:  The patient  reports that she has never smoked. She does not have any smokeless tobacco history on file. She reports that she does not drink alcohol.   Family History:  The patient's family history is not on file.   ROS:  Please see the history of present illness.      All other systems reviewed and negative.   PHYSICAL EXAM: VS:  BP 124/70  Pulse 65  Ht 5\' 5"  (1.651 m)  Wt 172 lb 3.2 oz (78.109 kg)  BMI 28.66 kg/m2 Well nourished, well developed, in no acute distress HEENT: normal Neck: no JVD Cardiac:  normal S1, S2; RRR; no murmur Lungs:  clear to auscultation bilaterally, no wheezing, rhonchi or rales Abd: soft, nontender, no hepatomegaly Ext: no edema Skin: warm and dry Neuro:  CNs 2-12 intact, no focal abnormalities noted  EKG:  AV paced with  PAC's     ASSESSMENT AND PLAN:  1. PAF maintaining AV paced rhythm  - continue diltiazem/metoprolol/warfarin/sotolol 2. HTN - controlled  - continue diltiazem and metoprolol 3. Tachybrady syndrome s/p PPM  Followup with me in 6 months  Signed, Armanda Magic, MD 02/02/2013 3:13 PM

## 2013-02-22 ENCOUNTER — Ambulatory Visit (INDEPENDENT_AMBULATORY_CARE_PROVIDER_SITE_OTHER): Payer: Medicare Other | Admitting: *Deleted

## 2013-02-22 ENCOUNTER — Encounter: Payer: Self-pay | Admitting: Internal Medicine

## 2013-02-22 DIAGNOSIS — Z45018 Encounter for adjustment and management of other part of cardiac pacemaker: Secondary | ICD-10-CM

## 2013-02-22 DIAGNOSIS — Z45010 Encounter for checking and testing of cardiac pacemaker pulse generator [battery]: Secondary | ICD-10-CM

## 2013-02-22 LAB — MDC_IDC_ENUM_SESS_TYPE_REMOTE
Battery Voltage: 2.83 V
Brady Statistic AP VP Percent: 83.2 %
Brady Statistic AS VS Percent: 0.14 %
Brady Statistic RV Percent Paced: 99.86 %
Date Time Interrogation Session: 20141204165418
Lead Channel Impedance Value: 448 Ohm
Lead Channel Setting Pacing Amplitude: 2.5 V

## 2013-02-27 NOTE — Addendum Note (Signed)
Addended by: Glenda Chroman on: 02/27/2013 06:14 PM   Modules accepted: Level of Service

## 2013-03-01 ENCOUNTER — Ambulatory Visit (INDEPENDENT_AMBULATORY_CARE_PROVIDER_SITE_OTHER): Payer: Medicare Other | Admitting: Pharmacist

## 2013-03-01 DIAGNOSIS — I4891 Unspecified atrial fibrillation: Secondary | ICD-10-CM | POA: Diagnosis not present

## 2013-03-06 ENCOUNTER — Encounter: Payer: Self-pay | Admitting: *Deleted

## 2013-03-26 ENCOUNTER — Ambulatory Visit (INDEPENDENT_AMBULATORY_CARE_PROVIDER_SITE_OTHER): Payer: Medicare Other | Admitting: *Deleted

## 2013-03-26 ENCOUNTER — Encounter: Payer: Self-pay | Admitting: Internal Medicine

## 2013-03-26 LAB — MDC_IDC_ENUM_SESS_TYPE_REMOTE
Brady Statistic AS VS Percent: 0.05 %
Brady Statistic RV Percent Paced: 99.95 %
Date Time Interrogation Session: 20150105171724
Lead Channel Impedance Value: 456 Ohm
Lead Channel Impedance Value: 464 Ohm
Lead Channel Sensing Intrinsic Amplitude: 2.1215
MDC IDC MSMT BATTERY VOLTAGE: 2.82 V
MDC IDC SET LEADCHNL RA PACING AMPLITUDE: 2 V
MDC IDC SET LEADCHNL RV PACING AMPLITUDE: 2.5 V
MDC IDC SET LEADCHNL RV PACING PULSEWIDTH: 0.4 ms
MDC IDC SET LEADCHNL RV SENSING SENSITIVITY: 0.9 mV
MDC IDC SET ZONE DETECTION INTERVAL: 350 ms
MDC IDC SET ZONE DETECTION INTERVAL: 400 ms
MDC IDC STAT BRADY AP VP PERCENT: 76.01 %
MDC IDC STAT BRADY AP VS PERCENT: 0 %
MDC IDC STAT BRADY AS VP PERCENT: 23.94 %
MDC IDC STAT BRADY RA PERCENT PACED: 76.01 %

## 2013-04-05 ENCOUNTER — Encounter: Payer: Self-pay | Admitting: *Deleted

## 2013-04-12 ENCOUNTER — Ambulatory Visit (INDEPENDENT_AMBULATORY_CARE_PROVIDER_SITE_OTHER): Payer: Medicare Other

## 2013-04-12 DIAGNOSIS — Z5181 Encounter for therapeutic drug level monitoring: Secondary | ICD-10-CM | POA: Insufficient documentation

## 2013-04-12 DIAGNOSIS — I4891 Unspecified atrial fibrillation: Secondary | ICD-10-CM | POA: Diagnosis not present

## 2013-04-12 LAB — POCT INR: INR: 3.2

## 2013-04-16 ENCOUNTER — Encounter: Payer: Self-pay | Admitting: Internal Medicine

## 2013-04-16 ENCOUNTER — Encounter: Payer: Self-pay | Admitting: Cardiology

## 2013-04-16 ENCOUNTER — Ambulatory Visit
Admission: RE | Admit: 2013-04-16 | Discharge: 2013-04-16 | Disposition: A | Discharge status: Home / Self Care | Payer: Medicare Other | Source: Ambulatory Visit | Attending: Cardiology | Admitting: Cardiology

## 2013-04-16 ENCOUNTER — Ambulatory Visit (INDEPENDENT_AMBULATORY_CARE_PROVIDER_SITE_OTHER): Payer: Medicare Other | Admitting: *Deleted

## 2013-04-16 ENCOUNTER — Ambulatory Visit (INDEPENDENT_AMBULATORY_CARE_PROVIDER_SITE_OTHER): Payer: Medicare Other | Admitting: Cardiology

## 2013-04-16 VITALS — BP 108/76 | HR 65 | Ht 64.0 in | Wt 177.0 lb

## 2013-04-16 DIAGNOSIS — I495 Sick sinus syndrome: Secondary | ICD-10-CM | POA: Diagnosis not present

## 2013-04-16 DIAGNOSIS — R0602 Shortness of breath: Secondary | ICD-10-CM

## 2013-04-16 DIAGNOSIS — I4891 Unspecified atrial fibrillation: Secondary | ICD-10-CM

## 2013-04-16 DIAGNOSIS — I10 Essential (primary) hypertension: Secondary | ICD-10-CM | POA: Diagnosis not present

## 2013-04-16 DIAGNOSIS — J449 Chronic obstructive pulmonary disease, unspecified: Secondary | ICD-10-CM | POA: Diagnosis not present

## 2013-04-16 LAB — MDC_IDC_ENUM_SESS_TYPE_INCLINIC
Lead Channel Setting Pacing Amplitude: 2 V
Lead Channel Setting Pacing Pulse Width: 0.4 ms
MDC IDC SET LEADCHNL RV PACING AMPLITUDE: 2.5 V
MDC IDC SET LEADCHNL RV SENSING SENSITIVITY: 0.9 mV
MDC IDC SET ZONE DETECTION INTERVAL: 350 ms
Zone Setting Detection Interval: 400 ms

## 2013-04-16 LAB — CBC
HCT: 39.2 % (ref 36.0–46.0)
Hemoglobin: 13.3 g/dL (ref 12.0–15.0)
MCHC: 33.8 g/dL (ref 30.0–36.0)
MCV: 92.9 fl (ref 78.0–100.0)
Platelets: 117 10*3/uL — ABNORMAL LOW (ref 150.0–400.0)
RBC: 4.22 Mil/uL (ref 3.87–5.11)
RDW: 15.6 % — ABNORMAL HIGH (ref 11.5–14.6)
WBC: 6.6 10*3/uL (ref 4.5–10.5)

## 2013-04-16 NOTE — Patient Instructions (Signed)
Your physician recommends that you continue on your current medications as directed. Please refer to the Current Medication list given to you today.  Your physician recommends that you go to the lab today for a BNP and CBC  A chest x-ray takes a picture of the organs and structures inside the chest, including the heart, lungs, and blood vessels. This test can show several things, including, whether the heart is enlarges; whether fluid is building up in the lungs; and whether pacemaker / defibrillator leads are still in place. ( This will be at the [place] at [facility]. This is a Dealer. Please go over today for the chest xray after lab work)

## 2013-04-16 NOTE — [place] Notes (Signed)
[place] [place], [place]  [address] Phone: [phone] Fax:  [phone]  Date:  04/16/2013   ID:  Molly Taylor, DOB 07-Feb-1927, MRN [id number]  PCP:  Donnie Coffin, MD  Cardiologist:  Fransico Him, MD     History of Present Illness: Molly Taylor is a 80 y.o. female with a history of HTN, PAF, tachybrady syndrome s/p PPM and chronic systemic anticoagulation presents today for followup. She is doing well but has been having SOB since last week.  She has a cough for the past 2-3 weeks with chest congestion and occasionally coughs up some clear phlegm.  She denies any fever or chills.  She denies any LE edema.  Last week she started having SOB with exertion but can occur just sitting.  She denies any chest pain or pressure.  She denies any palpitations or dizziness.   Wt Readings from Last 3 Encounters:  04/16/13 177 lb (80.287 kg)  02/02/13 172 lb 3.2 oz (78.109 kg)     Past Medical History  Diagnosis Date  . Allergic rhinitis, cause unspecified   . Hypercholesteremia   . GERD (gastroesophageal reflux disease)   . Hemorrhage of rectum and anus   . Generalized osteoarthrosis, unspecified site   . Hypertonicity of bladder   . HTN (hypertension)   . A-fib   . Tachy-brady syndrome     s/p PPM  . Chronic anticoagulation   . Pancreatitis, gallstone   . Renal cell cancer     Current Outpatient Prescriptions  Medication Sig Dispense Refill  . acetaminophen (TYLENOL) 650 MG suppository Place 650 mg rectally every 4 (four) hours as needed. For pain       . COD LIVER OIL PO Take 1 tablet by mouth daily.        Marland Kitchen diltiazem (DILACOR XR) 240 MG 24 hr capsule Take 240 mg by mouth daily.        Marland Kitchen loratadine (CLARITIN) 10 MG tablet Take 10 mg by mouth daily.        . metoprolol succinate (TOPROL-XL) 25 MG 24 hr tablet take 1 tablet by mouth twice a day  60 tablet  6  . multivitamin-iron-minerals-folic acid (CENTRUM) chewable tablet Chew 1 tablet by mouth daily.         . sotalol (BETAPACE) 160 MG tablet Take 160 mg by mouth 2 (two) times daily.        Marland Kitchen warfarin (COUMADIN) 6 MG tablet Take 6 mg by mouth daily.         No current facility-administered medications for this visit.    Allergies:    Allergies  Allergen Reactions  . Codeine   . Sulfa Antibiotics     Social History:  The patient  reports that she has never smoked. She does not have any smokeless tobacco history on file. She reports that she does not drink alcohol.   Family History:  The patient's family history is not on file.   ROS:  Please see the history of present illness.      All other systems reviewed and negative.   PHYSICAL EXAM: VS:  BP 108/76  Pulse 88  Ht 5\' 4"  (1.626 m)  Wt 177 lb (80.287 kg)  BMI 30.37 kg/m2  SpO2 93% Well nourished, well developed, in no acute distress HEENT: normal Neck: no JVD Cardiac:  normal S1, S2; RRR; no murmur Lungs:  Few crackles and coarse BS at left base Abd: soft, nontender, no hepatomegaly Ext: no  edema Skin: warm and dry Neuro:  CNs 2-12 intact, no focal abnormalities noted  EKG:  V paced rhythm     ASSESSMENT AND PLAN:  1. PAF maintaining AV paced rhythm - continue diltiazem/metoprolol/warfarin/sotolol   - pacer has reached ERI and has an appt with Dr. Lovena Le next week 2. HTN - controlled - continue diltiazem and metoprolol  3. Tachybrady syndrome s/p PPM 4. SOB which may be due to recent viral URI.  I suspect that her SOB is mainly due to reaching ERI and switching to VVI pacing.  She was switched back in clinic to AV pacing.  - check PA and lat Chest xray  - check BNP  - check CBC   - she is set up to see Dr. Lovena Le next week and he will arrange for battery changeout    Signed, Fransico Him, MD 04/16/2013 2:48 PM

## 2013-04-16 NOTE — [place] Notes (Signed)
Pacemaker check in clinic. Normal device function. Thresholds, sensing, impedances consistent with previous measurements. Device programmed to maximize longevity. 18 mode switches.  No high ventricular rates noted. Device programmed at appropriate safety margins. Histogram distribution appropriate for patient activity level. Device programmed to optimize intrinsic conduction.  Patient education completed.  Device @ ERI since 04/08/13.  Patient to be set up for change out.

## 2013-04-17 LAB — BRAIN NATRIURETIC PEPTIDE: Pro B Natriuretic peptide (BNP): 974 pg/mL — ABNORMAL HIGH (ref 0.0–100.0)

## 2013-04-18 ENCOUNTER — Telehealth: Payer: Self-pay | Admitting: Cardiology

## 2013-04-18 DIAGNOSIS — I509 Heart failure, unspecified: Secondary | ICD-10-CM

## 2013-04-18 DIAGNOSIS — Z79899 Other long term (current) drug therapy: Secondary | ICD-10-CM

## 2013-04-18 MED ORDER — FUROSEMIDE 40 MG PO TABS: 40.0000 mg | ORAL_TABLET | Freq: Every day | ORAL | Status: DC

## 2013-04-18 NOTE — Telephone Encounter (Signed)
Pt is aware. She is set to see PCP tomorrow on 1/29 for f/u of xray of chest and also to have a BMET done and faxed over to the office. Pt is also set for lab work next Thursday here after being on lasix to check BMET. Order put in for ECHO. Pt is aware someone will be calling her to schedule. Pt is also aware new med has been sent into her pharmacy for her.

## 2013-04-18 NOTE — Telephone Encounter (Signed)
Follow Up  Pt returned call// Results??

## 2013-04-19 DIAGNOSIS — J9819 Other pulmonary collapse: Secondary | ICD-10-CM | POA: Diagnosis not present

## 2013-04-19 DIAGNOSIS — I509 Heart failure, unspecified: Secondary | ICD-10-CM | POA: Diagnosis not present

## 2013-04-22 HISTORY — PX: PACEMAKER GENERATOR CHANGE: SHX5998

## 2013-04-23 NOTE — Addendum Note (Signed)
Addended by: Sharlot Gowda on: 04/23/2013 10:42 AM   Modules accepted: Level of Service

## 2013-04-24 ENCOUNTER — Ambulatory Visit (INDEPENDENT_AMBULATORY_CARE_PROVIDER_SITE_OTHER): Payer: Medicare Other | Admitting: Internal Medicine

## 2013-04-24 ENCOUNTER — Encounter: Payer: Self-pay | Admitting: Internal Medicine

## 2013-04-24 ENCOUNTER — Encounter: Payer: Self-pay | Admitting: *Deleted

## 2013-04-24 VITALS — BP 116/76 | HR 66 | Ht 64.0 in | Wt 171.6 lb

## 2013-04-24 DIAGNOSIS — Z950 Presence of cardiac pacemaker: Secondary | ICD-10-CM | POA: Insufficient documentation

## 2013-04-24 DIAGNOSIS — I4891 Unspecified atrial fibrillation: Secondary | ICD-10-CM | POA: Diagnosis not present

## 2013-04-24 DIAGNOSIS — Z45018 Encounter for adjustment and management of other part of cardiac pacemaker: Secondary | ICD-10-CM

## 2013-04-24 LAB — BASIC METABOLIC PANEL
BUN: 17 mg/dL (ref 6–23)
CALCIUM: 9.7 mg/dL (ref 8.4–10.5)
CHLORIDE: 108 meq/L (ref 96–112)
CO2: 26 mEq/L (ref 19–32)
Creatinine, Ser: 0.9 mg/dL (ref 0.4–1.2)
GFR: 74.75 mL/min (ref >60.00)
Glucose, Bld: 85 mg/dL (ref 70–99)
Potassium: 3.7 mEq/L (ref 3.5–5.1)
Sodium: 140 mEq/L (ref 135–145)

## 2013-04-24 LAB — CBC WITH DIFFERENTIAL/PLATELET
BASOS ABS: 0 10*3/uL (ref 0.0–0.1)
BASOS PCT: 0.5 % (ref 0.0–3.0)
EOS ABS: 0.1 10*3/uL (ref 0.0–0.7)
Eosinophils Relative: 1.5 % (ref 0.0–5.0)
HCT: 43.5 % (ref 36.0–46.0)
Hemoglobin: 14.2 g/dL (ref 12.0–15.0)
Lymphocytes Relative: 24 % (ref 12.0–46.0)
Lymphs Abs: 1.5 10*3/uL (ref 0.7–4.0)
MCHC: 32.7 g/dL (ref 30.0–36.0)
MCV: 95.8 fl (ref 78.0–100.0)
MONO ABS: 0.5 10*3/uL (ref 0.1–1.0)
Monocytes Relative: 8 % (ref 3.0–12.0)
NEUTROS PCT: 66 % (ref 43.0–77.0)
Neutro Abs: 4.2 10*3/uL (ref 1.4–7.7)
Platelets: 133 10*3/uL — ABNORMAL LOW (ref 150.0–400.0)
RBC: 4.55 Mil/uL (ref 3.87–5.11)
RDW: 15.3 % — AB (ref 11.5–14.6)
WBC: 6.3 10*3/uL (ref 4.5–10.5)

## 2013-04-24 LAB — PROTIME-INR
INR: 2.5 ratio — ABNORMAL HIGH (ref 0.8–1.0)
Prothrombin Time: 26.1 s — ABNORMAL HIGH (ref 10.2–12.4)

## 2013-04-24 NOTE — Assessment & Plan Note (Signed)
She is out of rhythm approximately 20% of the time. She will continue her sotalol.

## 2013-04-24 NOTE — Assessment & Plan Note (Signed)
Her DDD PM is working normally but has reached ERI and I have discussed the risks/benefits/goals/expectations of PPM with the patient and she wishes to proceed.

## 2013-04-24 NOTE — Progress Notes (Signed)
HPI Molly Taylor is a very pleasant 80 yo woman with a h/o symptomatic bradycardia and PAF, s/p PPM who is referred today for ongoing evaluation and management of her PPM. She has reached ERI and needs to have her PPM removed and a new device inserted. She denies syncope, chest pain or sob. No edema. She does not experience much in the way of palpitations although she is out of rhythm approximately 20% of the time. Allergies  Allergen Reactions  . Codeine Nausea And Vomiting  . Sulfa Antibiotics Swelling     Current Outpatient Prescriptions  Medication Sig Dispense Refill  . acetaminophen (TYLENOL) 650 MG suppository Place 650 mg rectally every 4 (four) hours as needed. For pain       . COD LIVER OIL PO Take 1 tablet by mouth daily.        Marland Kitchen diltiazem (DILACOR XR) 240 MG 24 hr capsule Take 240 mg by mouth daily.        . furosemide (LASIX) 40 MG tablet Take 1 tablet (40 mg total) by mouth daily.  30 tablet  11  . loratadine (CLARITIN) 10 MG tablet Take 10 mg by mouth daily.        . metoprolol succinate (TOPROL-XL) 25 MG 24 hr tablet take 1 tablet by mouth twice a day  60 tablet  6  . multivitamin-iron-minerals-folic acid (CENTRUM) chewable tablet Chew 1 tablet by mouth daily.        . sotalol (BETAPACE) 160 MG tablet Take 160 mg by mouth 2 (two) times daily.        Marland Kitchen warfarin (COUMADIN) 6 MG tablet Take 6 mg by mouth daily.         No current facility-administered medications for this visit.     Past Medical History  Diagnosis Date  . Allergic rhinitis, cause unspecified   . Hypercholesteremia   . GERD (gastroesophageal reflux disease)   . Hemorrhage of rectum and anus   . Generalized osteoarthrosis, unspecified site   . Hypertonicity of bladder   . HTN (hypertension)   . Chronic anticoagulation   . Pancreatitis, gallstone   . Renal cell cancer   . A-fib   . Tachy-brady syndrome     s/p PPM    ROS:   All systems reviewed and negative except as noted in the  HPI.   Past Surgical History  Procedure Laterality Date  . Left knee replacement - dr. French Ana    . Total abdominal hysterectomy w/ bilateral salpingoophorectomy    . Laparoscopic cholecystectomy       History reviewed. No pertinent family history.   History   Social History  . Marital Status: Married    Spouse Name: N/A    Number of Children: N/A  . Years of Education: N/A   Occupational History  . Not on file.   Social History Main Topics  . Smoking status: Never Smoker   . Smokeless tobacco: Not on file  . Alcohol Use: No  . Drug Use: Not on file  . Sexual Activity: Not on file   Other Topics Concern  . Not on file   Social History Narrative  . No narrative on file     BP 116/76  Pulse 66  Ht 5\' 4"  (1.626 m)  Wt 171 lb 9.6 oz (77.837 kg)  BMI 29.44 kg/m2  Physical Exam:  Well appearing elderly woman, NAD HEENT: Unremarkable Neck:  No JVD, no thyromegally Back:  No CVA tenderness  Lungs:  Clear with no wheezes, rales, or rhonchi HEART:  Regular rate rhythm, no murmurs, no rubs, no clicks Abd:  soft, positive bowel sounds, no organomegally, no rebound, no guarding Ext:  2 plus pulses, no edema, no cyanosis, no clubbing Skin:  No rashes no nodules Neuro:  CN II through XII intact, motor grossly intact   DEVICE  Normal device function.  See PaceArt for details.   Assess/Plan:

## 2013-04-24 NOTE — Patient Instructions (Signed)
See instruction sheet for generator change out

## 2013-04-25 ENCOUNTER — Ambulatory Visit (HOSPITAL_COMMUNITY): Payer: Medicare Other | Attending: Cardiology | Admitting: Radiology

## 2013-04-25 ENCOUNTER — Encounter (HOSPITAL_COMMUNITY): Payer: Self-pay | Admitting: Pharmacy Technician

## 2013-04-25 ENCOUNTER — Other Ambulatory Visit (INDEPENDENT_AMBULATORY_CARE_PROVIDER_SITE_OTHER): Payer: Medicare Other

## 2013-04-25 ENCOUNTER — Encounter: Payer: Self-pay | Admitting: Cardiology

## 2013-04-25 DIAGNOSIS — Z79899 Other long term (current) drug therapy: Secondary | ICD-10-CM | POA: Diagnosis not present

## 2013-04-25 DIAGNOSIS — I509 Heart failure, unspecified: Secondary | ICD-10-CM | POA: Diagnosis not present

## 2013-04-25 DIAGNOSIS — I4891 Unspecified atrial fibrillation: Secondary | ICD-10-CM | POA: Diagnosis not present

## 2013-04-25 DIAGNOSIS — I079 Rheumatic tricuspid valve disease, unspecified: Secondary | ICD-10-CM | POA: Insufficient documentation

## 2013-04-25 DIAGNOSIS — I359 Nonrheumatic aortic valve disorder, unspecified: Secondary | ICD-10-CM | POA: Insufficient documentation

## 2013-04-25 LAB — BASIC METABOLIC PANEL
BUN: 16 mg/dL (ref 6–23)
CHLORIDE: 106 meq/L (ref 96–112)
CO2: 26 meq/L (ref 19–32)
CREATININE: 0.9 mg/dL (ref 0.4–1.2)
Calcium: 9.2 mg/dL (ref 8.4–10.5)
GFR: 77.66 mL/min (ref >60.00)
Glucose, Bld: 81 mg/dL (ref 70–99)
POTASSIUM: 3.6 meq/L (ref 3.5–5.1)
Sodium: 139 mEq/L (ref 135–145)

## 2013-04-25 NOTE — Progress Notes (Signed)
Echocardiogram performed.  

## 2013-04-26 ENCOUNTER — Other Ambulatory Visit: Payer: Medicare Other

## 2013-04-26 ENCOUNTER — Telehealth: Payer: Self-pay | Admitting: Internal Medicine

## 2013-04-26 NOTE — Telephone Encounter (Signed)
New message   Patient calling stating she is taken 160 mg sotalol . Wanted Dr. Lovena Le to know due to upcoming procedure.

## 2013-04-30 ENCOUNTER — Other Ambulatory Visit: Payer: Self-pay | Admitting: General Surgery

## 2013-04-30 DIAGNOSIS — I77819 Aortic ectasia, unspecified site: Secondary | ICD-10-CM

## 2013-05-02 DIAGNOSIS — Z8553 Personal history of malignant neoplasm of renal pelvis: Secondary | ICD-10-CM | POA: Diagnosis not present

## 2013-05-02 DIAGNOSIS — I495 Sick sinus syndrome: Secondary | ICD-10-CM | POA: Diagnosis not present

## 2013-05-02 DIAGNOSIS — Z45018 Encounter for adjustment and management of other part of cardiac pacemaker: Secondary | ICD-10-CM | POA: Diagnosis not present

## 2013-05-02 DIAGNOSIS — K219 Gastro-esophageal reflux disease without esophagitis: Secondary | ICD-10-CM | POA: Diagnosis not present

## 2013-05-02 DIAGNOSIS — I10 Essential (primary) hypertension: Secondary | ICD-10-CM | POA: Diagnosis not present

## 2013-05-02 DIAGNOSIS — I4891 Unspecified atrial fibrillation: Secondary | ICD-10-CM | POA: Diagnosis not present

## 2013-05-02 MED ORDER — SODIUM CHLORIDE 0.9 % IR SOLN
80.0000 mg | Status: AC
  Filled 2013-05-02: qty 2

## 2013-05-02 MED ORDER — CHLORHEXIDINE GLUCONATE 4 % EX LIQD
60.0000 mL | Freq: Once | CUTANEOUS | Status: DC
  Filled 2013-05-02: qty 60

## 2013-05-02 MED ORDER — MUPIROCIN 2 % EX OINT
TOPICAL_OINTMENT | Freq: Two times a day (BID) | CUTANEOUS | Status: DC
  Filled 2013-05-02 (×2): qty 22

## 2013-05-02 MED ORDER — SODIUM CHLORIDE 0.9 % IV SOLN
INTRAVENOUS | Status: DC
  Administered 2013-05-03: 50 mL/h via INTRAVENOUS

## 2013-05-02 MED ORDER — CEFAZOLIN SODIUM-DEXTROSE 2-3 GM-% IV SOLR
2.0000 g | INTRAVENOUS | Status: AC
  Filled 2013-05-02 (×2): qty 50

## 2013-05-03 ENCOUNTER — Encounter (HOSPITAL_COMMUNITY)
Admission: RE | Disposition: A | Discharge status: Home / Self Care | Payer: Self-pay | Source: Ambulatory Visit | Attending: Internal Medicine

## 2013-05-03 ENCOUNTER — Ambulatory Visit (HOSPITAL_COMMUNITY)
Admission: RE | Admit: 2013-05-03 | Discharge: 2013-05-03 | Disposition: A | Discharge status: Home / Self Care | Payer: Medicare Other | Source: Ambulatory Visit | Attending: Internal Medicine | Admitting: Internal Medicine

## 2013-05-03 ENCOUNTER — Encounter (HOSPITAL_COMMUNITY): Payer: Self-pay | Admitting: *Deleted

## 2013-05-03 DIAGNOSIS — Z8553 Personal history of malignant neoplasm of renal pelvis: Secondary | ICD-10-CM | POA: Insufficient documentation

## 2013-05-03 DIAGNOSIS — K219 Gastro-esophageal reflux disease without esophagitis: Secondary | ICD-10-CM | POA: Insufficient documentation

## 2013-05-03 DIAGNOSIS — Z45018 Encounter for adjustment and management of other part of cardiac pacemaker: Secondary | ICD-10-CM | POA: Insufficient documentation

## 2013-05-03 DIAGNOSIS — I498 Other specified cardiac arrhythmias: Secondary | ICD-10-CM

## 2013-05-03 DIAGNOSIS — I10 Essential (primary) hypertension: Secondary | ICD-10-CM | POA: Insufficient documentation

## 2013-05-03 DIAGNOSIS — I495 Sick sinus syndrome: Secondary | ICD-10-CM | POA: Insufficient documentation

## 2013-05-03 DIAGNOSIS — I4891 Unspecified atrial fibrillation: Secondary | ICD-10-CM

## 2013-05-03 HISTORY — PX: PERMANENT PACEMAKER GENERATOR CHANGE: SHX6022

## 2013-05-03 LAB — PROTIME-INR
INR: 2.14 — ABNORMAL HIGH (ref 0.00–1.49)
Prothrombin Time: 23.2 seconds — ABNORMAL HIGH (ref 11.6–15.2)

## 2013-05-03 LAB — SURGICAL PCR SCREEN
MRSA, PCR: NEGATIVE
Staphylococcus aureus: NEGATIVE

## 2013-05-03 SURGERY — PERMANENT PACEMAKER GENERATOR CHANGE
Anesthesia: LOCAL

## 2013-05-03 MED ORDER — MIDAZOLAM HCL 5 MG/5ML IJ SOLN
INTRAMUSCULAR | Status: AC
  Filled 2013-05-03: qty 5

## 2013-05-03 MED ORDER — LIDOCAINE HCL (PF) 1 % IJ SOLN
INTRAMUSCULAR | Status: AC
  Filled 2013-05-03: qty 60

## 2013-05-03 MED ORDER — FENTANYL CITRATE 0.05 MG/ML IJ SOLN
INTRAMUSCULAR | Status: AC
Start: 2013-05-03 — End: 2013-05-03
  Filled 2013-05-03: qty 2

## 2013-05-03 MED ORDER — ACETAMINOPHEN 325 MG PO TABS: 325.0000 mg | ORAL_TABLET | ORAL | Status: DC | PRN

## 2013-05-03 MED ORDER — ONDANSETRON HCL 4 MG/2ML IJ SOLN: 4.0000 mg | Freq: Four times a day (QID) | INTRAMUSCULAR | Status: DC | PRN

## 2013-05-03 NOTE — Progress Notes (Signed)
Received pt alert and denies any discomfort at this time.  

## 2013-05-03 NOTE — Discharge Instructions (Signed)
Pacemaker Battery Change, Care After  °Refer to this sheet in the next few weeks. These instructions provide you with information on caring for yourself after your procedure. Your health care provider may also give you more specific instructions. Your treatment has been planned according to current medical practices, but problems sometimes occur. Call your health care provider if you have any problems or questions after your procedure. °WHAT TO EXPECT AFTER THE PROCEDURE °After your procedure, it is typical to have the following sensations: °· Soreness at the pacemaker site. °HOME CARE INSTRUCTIONS  °· Keep the incision clean and dry. °· Unless advised otherwise, you may shower beginning 48 hours after your procedure. °· For the first week after the replacement, avoid stretching motions that pull at the incision site and avoid heavy exercise with the arm on the same side as the incision. °· Only take over-the-counter or prescription medicines for pain, discomfort, or fever as directed by your health care provider. °· Your health care provider will tell you when you will need to next test your pacemaker by telephone or when to return to the office for follow up for removal of stitches. °SEEK MEDICAL CARE IF:  °· You have pain at the incision site that is not relieved by over-the-counter or prescription medicine. °· There is drainage or pus from the incision site. °· There is swelling larger than a lime at the incision site. °· You develop red streaking that extends above or below the incision site. °· You feel brief, intermittent palpitations, lightheadedness, or any symptoms that you feel might be related to your heart. °SEEK IMMEDIATE MEDICAL CARE IF:  °· You experience chest pain that is different than the pain at the pacemaker site. °· Shortness of breath. °· Palpitations or irregular heart beat. °· Lightheadedness that does not go away quickly. °· Fainting. °· You have pain that gets worse and is not relieved by  medicine. °MAKE SURE YOU:  °· Understand these instructions. °· Will watch your condition. °· Will get help right away if you are not doing well or get worse. °Document Released: 12/27/2012 Document Reviewed: 09/20/2012 °[employer]® Patient Information ©2014 [employer]. ° °

## 2013-05-03 NOTE — CV Procedure (Signed)
EP Procedure Note  Procedure: DDD PM removal, PPM pocket revision and insertion of a new PPM  Indication: After informed consent was obtained, the patient was prepped in the usual manner. 30 cc of lidocaine was infiltrated into the left infraclavicular region. A 5 cm incision was then carried out and electrocautery utilized to dissect down to the PPM pocket. The generator was removed. The leads were evaluated and found to be working satisfactorily. The pocket was expanded and revised, moving medially to accomodate the larger PM footprint. The new Medtronic PPM, (# [id number]) was connected to the old leads and place back into a revised pocket. The pocket was irrigated with anti-biotic irrigation and the incision was closed with two layers of vicryl suture. Benzoin and steristrips were painted on the skin and the incision bandaged and the patient returned to her room in satisfactory condition.  Complications: none immediately  Conclusion: successful pacemaker removal, pocket revision and insertion of a new DDD PM.  Mikle Bosworth.D.

## 2013-05-03 NOTE — Progress Notes (Signed)
Discharge instruction given per MD order. Five minutes of pressure held to left upper chest dressing.  No active bleeding noted at this time.  Pt and CG  was able to verbalize  Understanding  Pt to car via wheelchair.

## 2013-05-04 ENCOUNTER — Telehealth: Payer: Self-pay | Admitting: Internal Medicine

## 2013-05-04 NOTE — H&P (Signed)
HPI Molly Taylor is a very pleasant 80 yo woman with a h/o symptomatic bradycardia and PAF, s/p PPM who is referred today for ongoing evaluation and management of her PPM. She has reached ERI and needs to have her PPM removed and a new device inserted. She denies syncope, chest pain or sob. No edema. She does not experience much in the way of palpitations although she is out of rhythm approximately 20% of the time. Allergies   Allergen  Reactions   .  Codeine  Nausea And Vomiting   .  Sulfa Antibiotics  Swelling           Current Outpatient Prescriptions   Medication  Sig  Dispense  Refill   .  acetaminophen (TYLENOL) 650 MG suppository  Place 650 mg rectally every 4 (four) hours as needed. For pain          .  COD LIVER OIL PO  Take 1 tablet by mouth daily.           Marland Kitchen  diltiazem (DILACOR XR) 240 MG 24 hr capsule  Take 240 mg by mouth daily.           .  furosemide (LASIX) 40 MG tablet  Take 1 tablet (40 mg total) by mouth daily.   30 tablet   11   .  loratadine (CLARITIN) 10 MG tablet  Take 10 mg by mouth daily.           .  metoprolol succinate (TOPROL-XL) 25 MG 24 hr tablet  take 1 tablet by mouth twice a day   60 tablet   6   .  multivitamin-iron-minerals-folic acid (CENTRUM) chewable tablet  Chew 1 tablet by mouth daily.           .  sotalol (BETAPACE) 160 MG tablet  Take 160 mg by mouth 2 (two) times daily.           Marland Kitchen  warfarin (COUMADIN) 6 MG tablet  Take 6 mg by mouth daily.               No current facility-administered medications for this visit.           Past Medical History   Diagnosis  Date   .  Allergic rhinitis, cause unspecified     .  Hypercholesteremia     .  GERD (gastroesophageal reflux disease)     .  Hemorrhage of rectum and anus     .  Generalized osteoarthrosis, unspecified site     .  Hypertonicity of bladder     .  HTN (hypertension)     .  Chronic anticoagulation     .  Pancreatitis, gallstone     .  Renal cell cancer     .  A-fib     .   Tachy-brady syndrome         s/p PPM        ROS:    All systems reviewed and negative except as noted in the HPI.      Past Surgical History   Procedure  Laterality  Date   .  Left knee replacement - dr. French Ana       .  Total abdominal hysterectomy w/ bilateral salpingoophorectomy       .  Laparoscopic cholecystectomy              History reviewed. No pertinent family history.      History  Social History   .  Marital Status:  Married       Spouse Name:  N/A       Number of Children:  N/A   .  Years of Education:  N/A       Occupational History   .  Not on file.       Social History Main Topics   .  Smoking status:  Never Smoker    .  Smokeless tobacco:  Not on file   .  Alcohol Use:  No   .  Drug Use:  Not on file   .  Sexual Activity:  Not on file       Other Topics  Concern   .  Not on file       Social History Narrative   .  No narrative on file          BP 116/76  Pulse 66  Ht 5\' 4"  (1.626 m)  Wt 171 lb 9.6 oz (77.837 kg)  BMI 29.44 kg/m2   Physical Exam:   Well appearing elderly woman, NAD HEENT: Unremarkable Neck:  No JVD, no thyromegally Back:  No CVA tenderness Lungs:  Clear with no wheezes, rales, or rhonchi HEART:  Regular rate rhythm, no murmurs, no rubs, no clicks Abd:  soft, positive bowel sounds, no organomegally, no rebound, no guarding Ext:  2 plus pulses, no edema, no cyanosis, no clubbing Skin:  No rashes no nodules Neuro:  CN II through XII intact, motor grossly intact     DEVICE   Normal device function.  See PaceArt for details.    Assess/Plan:         Pacemaker - Evans Lance, MD at 04/24/2013 11:52 AM    Status: Written Related Problem: Pacemaker    Her DDD PM is working normally but has reached ERI and I have discussed the risks/benefits/goals/expectations of PPM with the patient and she wishes to proceed.         Atrial fibrillation - Evans Lance, MD at 04/24/2013 11:53 AM    Status:  Written Related Problem: Atrial fibrillation    She is out of rhythm approximately 20% of the time. She will continue her sotalol.    EP Attending  Patient seen and examined. Since prior clinic visit, no change in the history, physical exam, assessment and plan. For PPM generator removal and insertion of a new device.  Mikle Bosworth.D.

## 2013-05-04 NOTE — Telephone Encounter (Signed)
05/14/13 in device clinic for wound check

## 2013-05-04 NOTE — Telephone Encounter (Signed)
New message    Procedure on yesterday . When should she follow up

## 2013-05-06 ENCOUNTER — Other Ambulatory Visit: Payer: Self-pay | Admitting: Cardiology

## 2013-05-08 ENCOUNTER — Encounter (HOSPITAL_COMMUNITY): Payer: Self-pay | Admitting: *Deleted

## 2013-05-14 ENCOUNTER — Ambulatory Visit (INDEPENDENT_AMBULATORY_CARE_PROVIDER_SITE_OTHER): Payer: Medicare Other | Admitting: *Deleted

## 2013-05-14 ENCOUNTER — Ambulatory Visit (INDEPENDENT_AMBULATORY_CARE_PROVIDER_SITE_OTHER): Payer: Medicare Other | Admitting: Pharmacist

## 2013-05-14 DIAGNOSIS — I4891 Unspecified atrial fibrillation: Secondary | ICD-10-CM

## 2013-05-14 DIAGNOSIS — Z5181 Encounter for therapeutic drug level monitoring: Secondary | ICD-10-CM | POA: Diagnosis not present

## 2013-05-14 LAB — MDC_IDC_ENUM_SESS_TYPE_INCLINIC
Battery Remaining Longevity: 131 mo
Battery Voltage: 2.8 V
Brady Statistic AP VP Percent: 91 %
Brady Statistic AP VS Percent: 0 %
Brady Statistic AS VS Percent: 0 %
Date Time Interrogation Session: 20150223113055
Lead Channel Impedance Value: 635 Ohm
Lead Channel Pacing Threshold Amplitude: 0.75 V
Lead Channel Pacing Threshold Pulse Width: 0.4 ms
Lead Channel Pacing Threshold Pulse Width: 0.4 ms
Lead Channel Sensing Intrinsic Amplitude: 2 mV
Lead Channel Setting Pacing Amplitude: 2 V
Lead Channel Setting Pacing Amplitude: 2.5 V
Lead Channel Setting Pacing Pulse Width: 0.4 ms
Lead Channel Setting Sensing Sensitivity: 2.8 mV
MDC IDC MSMT BATTERY IMPEDANCE: 100 Ohm
MDC IDC MSMT LEADCHNL RA IMPEDANCE VALUE: 538 Ohm
MDC IDC MSMT LEADCHNL RA PACING THRESHOLD AMPLITUDE: 0.75 V
MDC IDC MSMT LEADCHNL RV SENSING INTR AMPL: 11.2 mV
MDC IDC STAT BRADY AS VP PERCENT: 9 %

## 2013-05-14 LAB — POCT INR: INR: 2.6

## 2013-05-14 NOTE — Progress Notes (Signed)
Wound check appointment. Steri-strips removed. Wound without redness or edema. Incision edges approximated, wound well healed. Normal device function. Thresholds, sensing, and impedances consistent with implant measurements. Device programmed at 3.5V/auto capture programmed on for extra safety margin until 3 month visit. Histogram distribution appropriate for patient and level of activity. 32.3% A-fib, + coumadin.  No  high ventricular rates noted. Patient educated about wound care, arm mobility, lifting restrictions. ROV in 3 months with implanting physician.

## 2013-05-18 ENCOUNTER — Encounter: Payer: Self-pay | Admitting: Internal Medicine

## 2013-05-26 ENCOUNTER — Other Ambulatory Visit: Payer: Self-pay | Admitting: Cardiology

## 2013-05-30 ENCOUNTER — Other Ambulatory Visit: Payer: Self-pay | Admitting: Cardiology

## 2013-06-12 DIAGNOSIS — J309 Allergic rhinitis, unspecified: Secondary | ICD-10-CM | POA: Diagnosis not present

## 2013-06-18 ENCOUNTER — Ambulatory Visit (INDEPENDENT_AMBULATORY_CARE_PROVIDER_SITE_OTHER): Payer: Medicare Other

## 2013-06-18 DIAGNOSIS — I4891 Unspecified atrial fibrillation: Secondary | ICD-10-CM | POA: Diagnosis not present

## 2013-06-18 DIAGNOSIS — Z1231 Encounter for screening mammogram for malignant neoplasm of breast: Secondary | ICD-10-CM | POA: Diagnosis not present

## 2013-06-18 DIAGNOSIS — Z5181 Encounter for therapeutic drug level monitoring: Secondary | ICD-10-CM

## 2013-06-18 LAB — POCT INR: INR: 1.5

## 2013-07-02 ENCOUNTER — Ambulatory Visit (INDEPENDENT_AMBULATORY_CARE_PROVIDER_SITE_OTHER): Payer: Medicare Other | Admitting: Pharmacist

## 2013-07-02 DIAGNOSIS — I4891 Unspecified atrial fibrillation: Secondary | ICD-10-CM | POA: Diagnosis not present

## 2013-07-02 DIAGNOSIS — Z5181 Encounter for therapeutic drug level monitoring: Secondary | ICD-10-CM | POA: Diagnosis not present

## 2013-07-02 LAB — POCT INR: INR: 2.3

## 2013-07-17 ENCOUNTER — Ambulatory Visit (INDEPENDENT_AMBULATORY_CARE_PROVIDER_SITE_OTHER): Payer: Medicare Other

## 2013-07-17 VITALS — BP 145/88 | HR 90 | Resp 16 | Ht 64.0 in | Wt 168.0 lb

## 2013-07-17 DIAGNOSIS — B351 Tinea unguium: Secondary | ICD-10-CM

## 2013-07-17 NOTE — Progress Notes (Signed)
   Subjective:    Patient ID: Fara Olden, female    DOB: 01/20/1927, 80 y.o.   MRN: [id number]  HPI Comments: "I need my toenails trimmed"  Patient states that she is unable to cut her toenails anymore. Toenails are thick and discolored. They are not painful. She would just like them trimmed today.     Review of Systems  All other systems reviewed and are negative.      Objective:   Physical Exam Patient is an 80 year old African American female well-developed well-nourished oriented x3 presents this time for help with nail care she is no longer able to cut her own nails concern she may cut her suffering herself patient describes no history of pain no secondary infection nails thick block darkened yellow brittle and crumbly and deformed 1 through 5 bilateral hallux nails bilateral are most severe most prominent. Patient is taking Coumadin for atrial fib denies diabetes denies neurovascular disease or peripheral vascular disease or any other complicating factors. Lower extremity objective findings as follows pedal pulses are palpable DP +2/4 PT plus one over 4 bilateral capillary refill time 3 seconds all digits epicritic and proprioceptive sensations intact and symmetric bilateral there is normal plantar response DTRs not listed neurologically skin color pigment normal hair growth absent nails thick brittle crumbly friable criptotic incurvated 1 through 5 bilateral no pain no secondary infections are noted no open wounds or ulcerations noted. Orthopedic biomechanical exam rectus foot type mild digital contractures are noted mild HAV deformity noted.       Assessment & Plan:  Assessment this time is onychomycosis nonpainful nails patient request debridement ABN form is reviewed and signed understands nail care his noncovered service at this time or in the future if there is no secondary infection or other cocking factors. Nails debrided 1 through 5 bilateral hallux nails are treated with  lumicain and Neosporin patient will initiate topical nail antifungal therapies was given options for her over-the-counter prescriptions at this time is chosen over-the-counter treatment with Fungi-Nail applied twice daily to the affected nails as instructed suggest followup in 3-6 months for continued palliative care if needed may be candidate for prescription antifungal therapies in the future based on progress or failure. Nails debrided x10 at this time with minimal or no pain or discomfort Neosporin applied the hallux nails bilateral recheck in 3 months for continued palliative care  Harriet Masson DPM

## 2013-07-17 NOTE — Patient Instructions (Signed)
Onychomycosis/Fungal Toenails  WHAT IS IT? An infection that lies within the keratin of your nail plate that is caused by a fungus.  WHY ME? Fungal infections affect all ages, sexes, races, and creeds.  There may be many factors that predispose you to a fungal infection such as age, coexisting medical conditions such as diabetes, or an autoimmune disease; stress, medications, fatigue, genetics, etc.  Bottom line: fungus thrives in a warm, moist environment and your shoes offer such a location.  IS IT CONTAGIOUS? Theoretically, yes.  You do not want to share shoes, nail clippers or files with someone who has fungal toenails.  Walking around barefoot in the same room or sleeping in the same bed is unlikely to transfer the organism.  It is important to realize, however, that fungus can spread easily from one nail to the next on the same foot.  HOW DO WE TREAT THIS?  There are several ways to treat this condition.  Treatment may depend on many factors such as age, medications, pregnancy, liver and kidney conditions, etc.  It is best to ask your doctor which options are available to you.  1. No treatment.   Unlike many other medical concerns, you can live with this condition.  However for many people this can be a painful condition and may lead to ingrown toenails or a bacterial infection.  It is recommended that you keep the nails cut short to help reduce the amount of fungal nail. 2. Topical treatment.  These range from herbal remedies to prescription strength nail lacquers.  About 40-50% effective, topicals require twice daily application for approximately 9 to 12 months or until an entirely new nail has grown out.  The most effective topicals are medical grade medications available through physicians offices. 3. Oral antifungal medications.  With an 80-90% cure rate, the most common oral medication requires 3 to 4 months of therapy and stays in your system for a year as the new nail grows out.  Oral  antifungal medications do require blood work to make sure it is a safe drug for you.  A liver function panel will be performed prior to starting the medication and after the first month of treatment.  It is important to have the blood work performed to avoid any harmful side effects.  In general, this medication safe but blood work is required. 4. Laser Therapy.  This treatment is performed by applying a specialized laser to the affected nail plate.  This therapy is noninvasive, fast, and non-painful.  It is not covered by insurance and is therefore, out of pocket.  The results have been very good with a 80-95% cure rate.  [facility] is the only practice in the area to offer this therapy. 5. Permanent Nail Avulsion.  Removing the entire nail so that a new nail will not grow back.  Topical nail antifungal can be obtained at any drugstore over-the-counter. The recommendation is to obtain Fungi-Nail and apply twice daily as instructed for 12 months. Apply daily to the affected toenails every morning every evening or after a bath or shower. Discontinue if any skin irritation occurs

## 2013-07-20 ENCOUNTER — Ambulatory Visit: Payer: Self-pay

## 2013-07-25 ENCOUNTER — Other Ambulatory Visit: Payer: Self-pay | Admitting: Cardiology

## 2013-08-03 DIAGNOSIS — M19049 Primary osteoarthritis, unspecified hand: Secondary | ICD-10-CM | POA: Diagnosis not present

## 2013-08-07 ENCOUNTER — Ambulatory Visit (INDEPENDENT_AMBULATORY_CARE_PROVIDER_SITE_OTHER): Payer: Medicare Other | Admitting: Internal Medicine

## 2013-08-07 ENCOUNTER — Ambulatory Visit (INDEPENDENT_AMBULATORY_CARE_PROVIDER_SITE_OTHER): Payer: Medicare Other | Admitting: *Deleted

## 2013-08-07 ENCOUNTER — Encounter: Payer: Self-pay | Admitting: Internal Medicine

## 2013-08-07 ENCOUNTER — Other Ambulatory Visit: Payer: Self-pay | Admitting: Orthopedic Surgery

## 2013-08-07 VITALS — BP 112/58 | HR 84 | Ht 64.0 in | Wt 167.4 lb

## 2013-08-07 DIAGNOSIS — I4891 Unspecified atrial fibrillation: Secondary | ICD-10-CM

## 2013-08-07 DIAGNOSIS — Z950 Presence of cardiac pacemaker: Secondary | ICD-10-CM

## 2013-08-07 DIAGNOSIS — I495 Sick sinus syndrome: Secondary | ICD-10-CM | POA: Diagnosis not present

## 2013-08-07 DIAGNOSIS — Z5181 Encounter for therapeutic drug level monitoring: Secondary | ICD-10-CM

## 2013-08-07 DIAGNOSIS — I10 Essential (primary) hypertension: Secondary | ICD-10-CM | POA: Diagnosis not present

## 2013-08-07 LAB — MDC_IDC_ENUM_SESS_TYPE_INCLINIC
Battery Impedance: 100 Ohm
Battery Remaining Longevity: 127 mo
Battery Voltage: 2.8 V
Brady Statistic AP VS Percent: 0 %
Lead Channel Impedance Value: 515 Ohm
Lead Channel Pacing Threshold Pulse Width: 0.4 ms
Lead Channel Setting Pacing Amplitude: 2 V
Lead Channel Setting Pacing Amplitude: 2.5 V
Lead Channel Setting Sensing Sensitivity: 2.8 mV
MDC IDC MSMT LEADCHNL RA PACING THRESHOLD AMPLITUDE: 0.5 V
MDC IDC MSMT LEADCHNL RA PACING THRESHOLD PULSEWIDTH: 0.4 ms
MDC IDC MSMT LEADCHNL RA SENSING INTR AMPL: 2 mV
MDC IDC MSMT LEADCHNL RV IMPEDANCE VALUE: 603 Ohm
MDC IDC MSMT LEADCHNL RV PACING THRESHOLD AMPLITUDE: 0.75 V
MDC IDC MSMT LEADCHNL RV SENSING INTR AMPL: 11.2 mV
MDC IDC SESS DTM: 20150519110357
MDC IDC SET LEADCHNL RV PACING PULSEWIDTH: 0.4 ms
MDC IDC STAT BRADY AP VP PERCENT: 96 %
MDC IDC STAT BRADY AS VP PERCENT: 4 %
MDC IDC STAT BRADY AS VS PERCENT: 0 %

## 2013-08-07 LAB — POCT INR: INR: 1.8

## 2013-08-07 NOTE — Patient Instructions (Signed)
Your physician wants you to follow-up in: 04/2014 with Dr Knox Saliva will receive a reminder letter in the mail two months in advance. If you don't receive a letter, please call our office to schedule the follow-up appointment.   Remote monitoring is used to monitor your Pacemaker of ICD from home. This monitoring reduces the number of office visits required to check your device to one time per year. It allows [place] to keep an eye on the functioning of your device to ensure it is working properly. You are scheduled for a device check from home on 11/08/13. You may send your transmission at any time that day. If you have a wireless device, the transmission will be sent automatically. After your physician reviews your transmission, you will receive a postcard with your next transmission date.

## 2013-08-07 NOTE — Progress Notes (Signed)
HPI [name] returns today for followup. She is a pleasant 80 yo woman with symptomatic bradycardia, s/p PPM generator change out in February. Since then, she has been stable. She had no trouble healing her incision. She denies chest pain or sob. No syncope. Minimal peripheral edema. Her blood pressure has been stable. She has maintained NSR on sotalol. Allergies  Allergen Reactions  . Codeine Nausea And Vomiting  . Sulfa Antibiotics Swelling     Current Outpatient Prescriptions  Medication Sig Dispense Refill  . acetaminophen (TYLENOL) 650 MG suppository Place 650 mg rectally daily as needed. For pain      . COD LIVER OIL PO Take 1 tablet by mouth daily.       Marland Kitchen diltiazem (CARDIZEM CD) 240 MG 24 hr capsule take 1 capsule by mouth once daily  240 capsule  0  . furosemide (LASIX) 40 MG tablet Take 40 mg by mouth as needed.       . loratadine (CLARITIN) 10 MG tablet Take 10 mg by mouth daily.       . metoprolol succinate (TOPROL-XL) 25 MG 24 hr tablet Take 25 mg by mouth 2 (two) times daily.      . multivitamin-iron-minerals-folic acid (CENTRUM) chewable tablet Chew 1 tablet by mouth daily.       . pseudoephedrine-guaifenesin (MUCINEX D) 60-600 MG per tablet Take 1 tablet by mouth every 12 (twelve) hours.      . sotalol (BETAPACE) 160 MG tablet take 1 tablet by mouth twice a day  180 tablet  1  . warfarin (COUMADIN) 6 MG tablet Take as directed by anticoagulation clinic  30 tablet  3   No current facility-administered medications for this visit.     Past Medical History  Diagnosis Date  . Allergic rhinitis, cause unspecified   . Hypercholesteremia   . GERD (gastroesophageal reflux disease)   . Hemorrhage of rectum and anus   . Generalized osteoarthrosis, unspecified site   . Hypertonicity of bladder   . HTN (hypertension)   . Chronic anticoagulation   . Pancreatitis, gallstone   . Renal cell cancer   . A-fib   . Tachy-brady syndrome     s/p PPM    ROS:   All  systems reviewed and negative except as noted in the HPI.   Past Surgical History  Procedure Laterality Date  . Left knee replacement - dr. French Ana    . Total abdominal hysterectomy w/ bilateral salpingoophorectomy    . Laparoscopic cholecystectomy    . Pacemaker generator change  04/2013    MDT ADDRL1 pacemaker implanted by Dr Lovena Le     History reviewed. No pertinent family history.   History   Social History  . Marital Status: Married    Spouse Name: N/A    Number of Children: N/A  . Years of Education: N/A   Occupational History  . Not on file.   Social History Main Topics  . Smoking status: Never Smoker   . Smokeless tobacco: Not on file  . Alcohol Use: No  . Drug Use: No  . Sexual Activity: Not on file   Other Topics Concern  . Not on file   Social History Narrative  . No narrative on file     BP 112/58  Pulse 84  Ht 5\' 4"  (1.626 m)  Wt 167 lb 6.4 oz (75.932 kg)  BMI 28.72 kg/m2  SpO2 95%  Physical Exam:  Well appearing NAD HEENT: Unremarkable Neck:  No JVD, no thyromegall Back:  No CVA tenderness Lungs:  Clear with no wheezes HEART:  Regular rate rhythm, no murmurs, no rubs, no clicks Abd:  soft, positive bowel sounds, no organomegally, no rebound, no guarding Ext:  2 plus pulses, no edema, no cyanosis, no clubbing Skin:  No rashes no nodules Neuro:  CN II through XII intact, motor grossly intact   DEVICE  Normal device function.  See PaceArt for details.   Assess/Plan:

## 2013-08-07 NOTE — Assessment & Plan Note (Signed)
Her Medtronic DDD PM is working normally and her incision has healed nicely. Will recheck in several months.

## 2013-08-07 NOTE — Assessment & Plan Note (Signed)
Her blood pressure is well controlled. No change in meds. 

## 2013-08-16 ENCOUNTER — Encounter (HOSPITAL_BASED_OUTPATIENT_CLINIC_OR_DEPARTMENT_OTHER): Payer: Self-pay | Admitting: *Deleted

## 2013-08-16 NOTE — Progress Notes (Signed)
Called Molly Taylor at Dr Carita Pian to see if she needs to go off coumadin-she will call dr taylor-pt will come in 08/21/13 for bmet-pt Bring all meds

## 2013-08-17 ENCOUNTER — Telehealth: Payer: Self-pay | Admitting: Internal Medicine

## 2013-08-17 NOTE — Progress Notes (Signed)
Molly Taylor called dr Lovena Le to ok off coumadin 3 days-pt called and told her to stop Monday 6/1

## 2013-08-17 NOTE — Telephone Encounter (Signed)
New message      Can pt hold coumadin starting Sunday to have a minor procedure next week?  She said she left a voicemail in the nurses vm box but has not received a call.

## 2013-08-17 NOTE — Telephone Encounter (Signed)
Wants to hold Coumadin on Mon and Tues and resume on Wed after surgery.  It is a 20 min procedure, removing a cyst due to arthritis   Let Molly Taylor know I will ask Dr Lovena Le on Tues morning and call her back

## 2013-08-21 ENCOUNTER — Encounter (HOSPITAL_BASED_OUTPATIENT_CLINIC_OR_DEPARTMENT_OTHER)
Admission: RE | Admit: 2013-08-21 | Discharge: 2013-08-21 | Disposition: A | Discharge status: Home / Self Care | Payer: Medicare Other | Source: Ambulatory Visit | Attending: Orthopedic Surgery | Admitting: Orthopedic Surgery

## 2013-08-21 DIAGNOSIS — Z79899 Other long term (current) drug therapy: Secondary | ICD-10-CM | POA: Diagnosis not present

## 2013-08-21 DIAGNOSIS — E7800 Pure hypercholesterolemia, unspecified: Secondary | ICD-10-CM | POA: Diagnosis not present

## 2013-08-21 DIAGNOSIS — M19049 Primary osteoarthritis, unspecified hand: Secondary | ICD-10-CM | POA: Diagnosis not present

## 2013-08-21 DIAGNOSIS — Z882 Allergy status to sulfonamides status: Secondary | ICD-10-CM | POA: Diagnosis not present

## 2013-08-21 DIAGNOSIS — K219 Gastro-esophageal reflux disease without esophagitis: Secondary | ICD-10-CM | POA: Diagnosis not present

## 2013-08-21 DIAGNOSIS — Z85528 Personal history of other malignant neoplasm of kidney: Secondary | ICD-10-CM | POA: Diagnosis not present

## 2013-08-21 DIAGNOSIS — I4891 Unspecified atrial fibrillation: Secondary | ICD-10-CM | POA: Diagnosis not present

## 2013-08-21 DIAGNOSIS — Z96659 Presence of unspecified artificial knee joint: Secondary | ICD-10-CM | POA: Diagnosis not present

## 2013-08-21 DIAGNOSIS — M7130 Other bursal cyst, unspecified site: Secondary | ICD-10-CM | POA: Diagnosis not present

## 2013-08-21 DIAGNOSIS — Z7901 Long term (current) use of anticoagulants: Secondary | ICD-10-CM | POA: Diagnosis not present

## 2013-08-21 DIAGNOSIS — Z950 Presence of cardiac pacemaker: Secondary | ICD-10-CM | POA: Diagnosis not present

## 2013-08-21 DIAGNOSIS — I495 Sick sinus syndrome: Secondary | ICD-10-CM | POA: Diagnosis not present

## 2013-08-21 DIAGNOSIS — Z885 Allergy status to narcotic agent status: Secondary | ICD-10-CM | POA: Diagnosis not present

## 2013-08-21 DIAGNOSIS — I10 Essential (primary) hypertension: Secondary | ICD-10-CM | POA: Diagnosis not present

## 2013-08-21 LAB — BASIC METABOLIC PANEL
BUN: 16 mg/dL (ref 6–23)
CALCIUM: 9.5 mg/dL (ref 8.4–10.5)
CO2: 24 meq/L (ref 19–32)
CREATININE: 0.74 mg/dL (ref 0.50–1.10)
Chloride: 107 mEq/L (ref 96–112)
GFR calc Af Amer: 88 mL/min — ABNORMAL LOW (ref >90)
GFR calc non Af Amer: 76 mL/min — ABNORMAL LOW (ref >90)
Glucose, Bld: 89 mg/dL (ref 70–99)
Potassium: 4 mEq/L (ref 3.7–5.3)
SODIUM: 142 meq/L (ref 137–147)

## 2013-08-21 LAB — APTT: aPTT: 37 seconds (ref 24–37)

## 2013-08-21 LAB — PROTIME-INR
INR: 1.68 — ABNORMAL HIGH (ref 0.00–1.49)
Prothrombin Time: 19.3 seconds — ABNORMAL HIGH (ref 11.6–15.2)

## 2013-08-21 NOTE — Telephone Encounter (Signed)
Dr Lovena Le says ok to hold Coumadin  Left message for Jeani Hawking okay to proceed

## 2013-08-22 ENCOUNTER — Encounter (HOSPITAL_BASED_OUTPATIENT_CLINIC_OR_DEPARTMENT_OTHER): Payer: Medicare Other | Admitting: Anesthesiology

## 2013-08-22 ENCOUNTER — Ambulatory Visit (HOSPITAL_BASED_OUTPATIENT_CLINIC_OR_DEPARTMENT_OTHER): Payer: Medicare Other | Admitting: Anesthesiology

## 2013-08-22 ENCOUNTER — Encounter (HOSPITAL_BASED_OUTPATIENT_CLINIC_OR_DEPARTMENT_OTHER)
Admission: RE | Disposition: A | Discharge status: Home / Self Care | Payer: Self-pay | Source: Ambulatory Visit | Attending: Orthopedic Surgery

## 2013-08-22 ENCOUNTER — Ambulatory Visit (HOSPITAL_BASED_OUTPATIENT_CLINIC_OR_DEPARTMENT_OTHER)
Admission: RE | Admit: 2013-08-22 | Discharge: 2013-08-22 | Disposition: A | Discharge status: Home / Self Care | Payer: Medicare Other | Source: Ambulatory Visit | Attending: Orthopedic Surgery | Admitting: Orthopedic Surgery

## 2013-08-22 ENCOUNTER — Encounter (HOSPITAL_BASED_OUTPATIENT_CLINIC_OR_DEPARTMENT_OTHER): Payer: Self-pay | Admitting: Orthopedic Surgery

## 2013-08-22 DIAGNOSIS — Z882 Allergy status to sulfonamides status: Secondary | ICD-10-CM | POA: Insufficient documentation

## 2013-08-22 DIAGNOSIS — M19049 Primary osteoarthritis, unspecified hand: Secondary | ICD-10-CM | POA: Insufficient documentation

## 2013-08-22 DIAGNOSIS — Z96659 Presence of unspecified artificial knee joint: Secondary | ICD-10-CM | POA: Insufficient documentation

## 2013-08-22 DIAGNOSIS — I4891 Unspecified atrial fibrillation: Secondary | ICD-10-CM | POA: Insufficient documentation

## 2013-08-22 DIAGNOSIS — I495 Sick sinus syndrome: Secondary | ICD-10-CM | POA: Insufficient documentation

## 2013-08-22 DIAGNOSIS — E7800 Pure hypercholesterolemia, unspecified: Secondary | ICD-10-CM | POA: Insufficient documentation

## 2013-08-22 DIAGNOSIS — D492 Neoplasm of unspecified behavior of bone, soft tissue, and skin: Secondary | ICD-10-CM | POA: Diagnosis not present

## 2013-08-22 DIAGNOSIS — Z950 Presence of cardiac pacemaker: Secondary | ICD-10-CM | POA: Insufficient documentation

## 2013-08-22 DIAGNOSIS — K219 Gastro-esophageal reflux disease without esophagitis: Secondary | ICD-10-CM | POA: Diagnosis not present

## 2013-08-22 DIAGNOSIS — Z885 Allergy status to narcotic agent status: Secondary | ICD-10-CM | POA: Insufficient documentation

## 2013-08-22 DIAGNOSIS — Z85528 Personal history of other malignant neoplasm of kidney: Secondary | ICD-10-CM | POA: Insufficient documentation

## 2013-08-22 DIAGNOSIS — Z7901 Long term (current) use of anticoagulants: Secondary | ICD-10-CM | POA: Insufficient documentation

## 2013-08-22 DIAGNOSIS — M6740 Ganglion, unspecified site: Secondary | ICD-10-CM | POA: Diagnosis not present

## 2013-08-22 DIAGNOSIS — Z79899 Other long term (current) drug therapy: Secondary | ICD-10-CM | POA: Insufficient documentation

## 2013-08-22 DIAGNOSIS — M7130 Other bursal cyst, unspecified site: Secondary | ICD-10-CM | POA: Diagnosis not present

## 2013-08-22 DIAGNOSIS — I10 Essential (primary) hypertension: Secondary | ICD-10-CM | POA: Insufficient documentation

## 2013-08-22 HISTORY — DX: Presence of spectacles and contact lenses: Z97.3

## 2013-08-22 HISTORY — PX: PROXIMAL INTERPHALANGEAL FUSION (PIP): SHX6043

## 2013-08-22 HISTORY — DX: Presence of dental prosthetic device (complete) (partial): Z97.2

## 2013-08-22 SURGERY — FUSION, PIP JOINT
Anesthesia: Monitor Anesthesia Care | Site: Thumb | Laterality: Right

## 2013-08-22 MED ORDER — CHLORHEXIDINE GLUCONATE 4 % EX LIQD: 60.0000 mL | Freq: Once | CUTANEOUS | Status: DC

## 2013-08-22 MED ORDER — ONDANSETRON HCL 4 MG/2ML IJ SOLN: 4.0000 mg | Freq: Once | INTRAMUSCULAR | Status: DC | PRN

## 2013-08-22 MED ORDER — LIDOCAINE HCL (CARDIAC) 20 MG/ML IV SOLN
INTRAVENOUS | Status: DC | PRN
  Administered 2013-08-22: 50 mg via INTRAVENOUS

## 2013-08-22 MED ORDER — LACTATED RINGERS IV SOLN
INTRAVENOUS | Status: DC
  Administered 2013-08-22: 09:00:00 via INTRAVENOUS

## 2013-08-22 MED ORDER — TRAMADOL HCL 50 MG PO TABS
50.0000 mg | ORAL_TABLET | Freq: Four times a day (QID) | ORAL | Status: DC | PRN
  Administered 2013-08-22: 50 mg via ORAL

## 2013-08-22 MED ORDER — PROPOFOL INFUSION 10 MG/ML OPTIME
INTRAVENOUS | Status: DC | PRN
  Administered 2013-08-22: 100 ug/kg/min via INTRAVENOUS

## 2013-08-22 MED ORDER — TRAMADOL HCL 50 MG PO TABS
ORAL_TABLET | ORAL | Status: AC
  Filled 2013-08-22: qty 1

## 2013-08-22 MED ORDER — BUPIVACAINE HCL (PF) 0.25 % IJ SOLN
INTRAMUSCULAR | Status: DC | PRN
  Administered 2013-08-22: 5 mL

## 2013-08-22 MED ORDER — FENTANYL CITRATE 0.05 MG/ML IJ SOLN
INTRAMUSCULAR | Status: DC | PRN
  Administered 2013-08-22: 50 ug via INTRAVENOUS

## 2013-08-22 MED ORDER — FENTANYL CITRATE 0.05 MG/ML IJ SOLN: 25.0000 ug | INTRAMUSCULAR | Status: DC | PRN

## 2013-08-22 MED ORDER — MIDAZOLAM HCL 2 MG/2ML IJ SOLN: 1.0000 mg | INTRAMUSCULAR | Status: DC | PRN

## 2013-08-22 MED ORDER — CEFAZOLIN SODIUM-DEXTROSE 2-3 GM-% IV SOLR
2.0000 g | INTRAVENOUS | Status: AC
  Administered 2013-08-22: 2 g via INTRAVENOUS

## 2013-08-22 MED ORDER — TRAMADOL HCL 50 MG PO TABS: 50.0000 mg | ORAL_TABLET | Freq: Four times a day (QID) | ORAL | Status: DC | PRN

## 2013-08-22 MED ORDER — CEFAZOLIN SODIUM-DEXTROSE 2-3 GM-% IV SOLR: 2.0000 g | INTRAVENOUS | Status: DC

## 2013-08-22 MED ORDER — FENTANYL CITRATE 0.05 MG/ML IJ SOLN: 50.0000 ug | INTRAMUSCULAR | Status: DC | PRN

## 2013-08-22 MED ORDER — PROPOFOL 10 MG/ML IV BOLUS
INTRAVENOUS | Status: AC
  Filled 2013-08-22: qty 20

## 2013-08-22 MED ORDER — FENTANYL CITRATE 0.05 MG/ML IJ SOLN
INTRAMUSCULAR | Status: AC
  Filled 2013-08-22: qty 4

## 2013-08-22 SURGICAL SUPPLY — 51 items
BLADE MINI RND TIP GREEN BEAV (BLADE) ×3 IMPLANT
BLADE SURG 15 STRL LF DISP TIS (BLADE) ×1 IMPLANT
BLADE SURG 15 STRL SS (BLADE) ×3
BNDG CMPR 9X4 STRL LF SNTH (GAUZE/BANDAGES/DRESSINGS) ×1
BNDG COHESIVE 3X5 TAN STRL LF (GAUZE/BANDAGES/DRESSINGS) ×3 IMPLANT
BNDG ESMARK 4X9 LF (GAUZE/BANDAGES/DRESSINGS) ×3 IMPLANT
BNDG GAUZE ELAST 4 BULKY (GAUZE/BANDAGES/DRESSINGS) IMPLANT
BUR FAST CUTTING MED (BURR) IMPLANT
CHLORAPREP W/TINT 26ML (MISCELLANEOUS) ×3 IMPLANT
CORDS BIPOLAR (ELECTRODE) ×3 IMPLANT
COVER MAYO STAND STRL (DRAPES) ×3 IMPLANT
COVER TABLE BACK 60X90 (DRAPES) ×3 IMPLANT
CUFF TOURNIQUET SINGLE 18IN (TOURNIQUET CUFF) ×2 IMPLANT
DRAPE EXTREMITY T 121X128X90 (DRAPE) ×3 IMPLANT
DRAPE OEC MINIVIEW 54X84 (DRAPES) ×3 IMPLANT
DRAPE SURG 17X23 STRL (DRAPES) ×3 IMPLANT
DRSG KUZMA FLUFF (GAUZE/BANDAGES/DRESSINGS) ×1 IMPLANT
GAUZE SPONGE 4X4 12PLY STRL (GAUZE/BANDAGES/DRESSINGS) ×3 IMPLANT
GAUZE XEROFORM 1X8 LF (GAUZE/BANDAGES/DRESSINGS) ×3 IMPLANT
GLOVE BIOGEL M STRL SZ7.5 (GLOVE) ×2 IMPLANT
GLOVE BIOGEL PI IND STRL 7.0 (GLOVE) IMPLANT
GLOVE BIOGEL PI IND STRL 8.5 (GLOVE) ×1 IMPLANT
GLOVE BIOGEL PI INDICATOR 7.0 (GLOVE) ×2
GLOVE BIOGEL PI INDICATOR 8.5 (GLOVE) ×2
GLOVE ECLIPSE 7.0 STRL STRAW (GLOVE) ×2 IMPLANT
GLOVE SURG ORTHO 8.0 STRL STRW (GLOVE) ×3 IMPLANT
GOWN STRL REUS W/ TWL LRG LVL3 (GOWN DISPOSABLE) ×1 IMPLANT
GOWN STRL REUS W/TWL LRG LVL3 (GOWN DISPOSABLE) ×3
GOWN STRL REUS W/TWL XL LVL3 (GOWN DISPOSABLE) ×3 IMPLANT
NEEDLE 27GAX1X1/2 (NEEDLE) ×2 IMPLANT
NS IRRIG 1000ML POUR BTL (IV SOLUTION) ×3 IMPLANT
PACK BASIN DAY SURGERY FS (CUSTOM PROCEDURE TRAY) ×3 IMPLANT
PAD CAST 3X4 CTTN HI CHSV (CAST SUPPLIES) ×1 IMPLANT
PADDING CAST ABS 3INX4YD NS (CAST SUPPLIES)
PADDING CAST ABS 4INX4YD NS (CAST SUPPLIES) ×2
PADDING CAST ABS COTTON 3X4 (CAST SUPPLIES) IMPLANT
PADDING CAST ABS COTTON 4X4 ST (CAST SUPPLIES) ×1 IMPLANT
PADDING CAST COTTON 3X4 STRL (CAST SUPPLIES) ×3
SLEEVE SCD COMPRESS KNEE MED (MISCELLANEOUS) ×2 IMPLANT
SPLINT FINGER 5/8X2.25 (CAST SUPPLIES) IMPLANT
SPLINT PLASTALUME 2 1/4 (CAST SUPPLIES) ×3
SPLINT PLASTER CAST XFAST 3X15 (CAST SUPPLIES) IMPLANT
SPLINT PLASTER XTRA FASTSET 3X (CAST SUPPLIES)
STOCKINETTE 4X48 STRL (DRAPES) ×3 IMPLANT
SUT VICRYL 4-0 PS2 18IN ABS (SUTURE) ×3 IMPLANT
SUT VICRYL RAPID 5 0 P 3 (SUTURE) IMPLANT
SUT VICRYL RAPIDE 4/0 PS 2 (SUTURE) ×3 IMPLANT
SYR BULB 3OZ (MISCELLANEOUS) ×3 IMPLANT
SYR CONTROL 10ML LL (SYRINGE) ×2 IMPLANT
TOWEL OR 17X24 6PK STRL BLUE (TOWEL DISPOSABLE) ×3 IMPLANT
UNDERPAD 30X30 INCONTINENT (UNDERPADS AND DIAPERS) ×3 IMPLANT

## 2013-08-22 NOTE — Brief Op Note (Signed)
08/22/2013  10:36 AM  PATIENT:  Molly Taylor  80 y.o. female  PRE-OPERATIVE DIAGNOSIS:  mucoid cyst right thumb degenerative arthritis interphalangeal joint  POST-OPERATIVE DIAGNOSIS:  mucoid cyst right thumb degenerative arthritis   PROCEDURE:  Procedure(s): EXCISION MUCOID CYST DEBRIDEMENT PROXIMAL INTERPHALANGEAL JOINT (Right)  SURGEON:  Surgeon(s) and Role:    * Wynonia Sours, MD - Primary  PHYSICIAN ASSISTANT:   ASSISTANTS: none   ANESTHESIA:   local and regional  EBL:  Total I/O In: 600 [I.V.:600] Out: -   BLOOD ADMINISTERED:none  DRAINS: none   LOCAL MEDICATIONS USED:  BUPIVICAINE   SPECIMEN:  Excision  DISPOSITION OF SPECIMEN:  PATHOLOGY  COUNTS:  YES  TOURNIQUET:  * Missing tourniquet times found for documented tourniquets in log:  [id number] *  DICTATION: .Other Dictation: Dictation Number [id number]  PLAN OF CARE: Discharge to home after PACU  PATIENT DISPOSITION:  PACU - hemodynamically stable.

## 2013-08-22 NOTE — Discharge Instructions (Addendum)

## 2013-08-22 NOTE — H&P (Signed)
Molly Taylor is an 80 year old right hand dominant female complaining of swelling over the IP joint of her right thumb. This has been present for 2 months. She has had this excised by myself about 6 years ago and it has recurred. She has a pacemaker in place and is on Coumadin. She has an INR of approximately 2.5. She complains of intermittent, moderate aching type pain with a feeling of swelling. She states the mass has enlarged slightly. It has not opened up. She is seen with her daughter.  PAST MEDICAL HISTORY: She is allergic to Codeine. She is on Coumadin, metoprolol, and sotalol. She has had a cholecystectomy and multiple pacemakers.  FAMILY H ISTORY: Positive for diabetes, heart disease, high BP and arthritis.  SOCIAL HISTORY: She does not smoke or drink. She is married and retired.   REVIEW OF SYSTEMS: Negative for all but glasses, easy bleeding/bruising.  Molly Taylor is an 80 y.o. female.   Chief Complaint: mucoid cyst and degenerative arthritis right thumb HPI: see above  Past Medical History  Diagnosis Date  . Allergic rhinitis, cause unspecified   . Hypercholesteremia   . GERD (gastroesophageal reflux disease)   . Hemorrhage of rectum and anus   . Generalized osteoarthrosis, unspecified site   . Hypertonicity of bladder   . HTN (hypertension)   . Chronic anticoagulation   . Pancreatitis, gallstone   . Renal cell cancer   . A-fib   . Tachy-brady syndrome     s/p PPM  . Wears glasses     reading  . Wears partial dentures     upper partial    Past Surgical History  Procedure Laterality Date  . Left knee replacement - dr. French Ana  2012    lt total knee  . Total abdominal hysterectomy w/ bilateral salpingoophorectomy    . Pacemaker generator change  04/2013    MDT ADDRL1 pacemaker implanted by Dr Lovena Le  . Laparoscopic cholecystectomy  2007    lap choli  . Tubal ligation    . Thumb arthroscopy  2007    right  . Pacemaker insertion  2007  . Vein ligation       History reviewed. No pertinent family history. Social History:  reports that she has never smoked. She does not have any smokeless tobacco history on file. She reports that she does not drink alcohol or use illicit drugs.  Allergies:  Allergies  Allergen Reactions  . Codeine Nausea And Vomiting  . Sulfa Antibiotics Swelling    No prescriptions prior to admission    Results for orders placed during the hospital encounter of 08/22/13 (from the past 48 hour(s))  BASIC METABOLIC PANEL     Status: Abnormal   Collection Time    08/21/13 12:00 PM      Result Value Ref Range   Sodium 142  137 - 147 mEq/L   Potassium 4.0  3.7 - 5.3 mEq/L   Chloride 107  96 - 112 mEq/L   CO2 24  19 - 32 mEq/L   Glucose, Bld 89  70 - 99 mg/dL   BUN 16  6 - 23 mg/dL   Creatinine, Ser 0.74  0.50 - 1.10 mg/dL   Calcium 9.5  8.4 - 10.5 mg/dL   GFR calc non Af Amer 76 (*) >90 mL/min   GFR calc Af Amer 88 (*) >90 mL/min   Comment: (NOTE)     The eGFR has been calculated using the CKD EPI equation.  This calculation has not been validated in all clinical situations.     eGFR's persistently <90 mL/min signify possible Chronic Kidney     Disease.  PROTIME-INR     Status: Abnormal   Collection Time    08/21/13 12:00 PM      Result Value Ref Range   Prothrombin Time 19.3 (*) 11.6 - 15.2 seconds   INR 1.68 (*) 0.00 - 1.49  APTT     Status: None   Collection Time    08/21/13 12:00 PM      Result Value Ref Range   aPTT 37  24 - 37 seconds   Comment:            IF BASELINE aPTT IS ELEVATED,     SUGGEST PATIENT RISK ASSESSMENT     BE USED TO DETERMINE APPROPRIATE     ANTICOAGULANT THERAPY.    No results found.   Pertinent items are noted in HPI.  Height 5' 4"  (1.626 m), weight 75.297 kg (166 lb).  General appearance: alert, cooperative and appears stated age Head: Normocephalic, without obvious abnormality Neck: no JVD Resp: clear to auscultation bilaterally Cardio: regular rate and  rhythm, S1, S2 normal, no murmur, click, rub or gallop GI: soft, non-tender; bowel sounds normal; no masses,  no organomegaly Extremities: mass right thumb IP joint Pulses: 2+ and symmetric Skin: Skin color, texture, turgor normal. No rashes or lesions Neurologic: Grossly normal Incision/Wound: na  Assessment/Plan X-rays reveal pantrapezial arthritis with degenerative arthritis IP joint.  Diagnosis: (1) Mucoid cyst. (2) Degenerative arthritis right thumb.  We have discussed with them excision of the cyst, debridement of the IP joint. We will get in touch with Dr. Jacqulyn Ducking with respect to her Coumadin. She has had this removed in the past for surgical intervention. This will be scheduled as an outpatient under regional anesthesia for excision of mucoid cyst, debridement IP joint thumb.  Wynonia Sours 08/22/2013, 8:30 AM

## 2013-08-22 NOTE — Anesthesia Preprocedure Evaluation (Signed)
Anesthesia Evaluation  Patient identified by MRN, date of birth, ID band Patient awake    Reviewed: Allergy & Precautions, H&P , NPO status , Patient's Chart, lab work & pertinent test results, reviewed documented beta blocker date and time   Airway Mallampati: I TM Distance: >3 FB Neck ROM: Full    Dental  (+) Missing, Dental Advisory Given   Pulmonary  breath sounds clear to auscultation        Cardiovascular hypertension, Pt. on medications and Pt. on home beta blockers Rhythm:Regular Rate:Normal     Neuro/Psych    GI/Hepatic GERD-  Medicated and Controlled,  Endo/Other    Renal/GU      Musculoskeletal   Abdominal   Peds  Hematology   Anesthesia Other Findings   Reproductive/Obstetrics                           Anesthesia Physical Anesthesia Plan  ASA: III  Anesthesia Plan: MAC and Bier Block   Post-op Pain Management:    Induction: Intravenous  Airway Management Planned: Simple Face Mask  Additional Equipment:   Intra-op Plan:   Post-operative Plan:   Informed Consent: I have reviewed the patients History and Physical, chart, labs and discussed the procedure including the risks, benefits and alternatives for the proposed anesthesia with the patient or authorized representative who has indicated his/her understanding and acceptance.   Dental advisory given  Plan Discussed with: CRNA, Anesthesiologist and Surgeon  Anesthesia Plan Comments:         Anesthesia Quick Evaluation

## 2013-08-22 NOTE — Anesthesia Postprocedure Evaluation (Signed)
  Anesthesia Post-op Note  Patient: Molly Taylor  Procedure(s) Performed: Procedure(s): EXCISION MUCOID CYST DEBRIDEMENT PROXIMAL INTERPHALANGEAL JOINT (Right)  Patient Location: PACU  Anesthesia Type:MAC and Bier block  Level of Consciousness: awake, alert  and oriented  Airway and Oxygen Therapy: Patient Spontanous Breathing  Post-op Pain: none  Post-op Assessment: Post-op Vital signs reviewed  Post-op Vital Signs: Reviewed  Last Vitals:  Filed Vitals:   08/22/13 1100  BP:   Pulse: 60  Temp:   Resp: 10    Complications: No apparent anesthesia complications

## 2013-08-22 NOTE — Transfer of Care (Signed)
Immediate Anesthesia Transfer of Care Note  Patient: Molly Taylor  Procedure(s) Performed: Procedure(s): EXCISION MUCOID CYST DEBRIDEMENT PROXIMAL INTERPHALANGEAL JOINT (Right)  Patient Location: PACU  Anesthesia Type:Bier block  Level of Consciousness: awake, alert , oriented and patient cooperative  Airway & Oxygen Therapy: Patient Spontanous Breathing and Patient connected to face mask oxygen  Post-op Assessment: Report given to PACU RN and Post -op Vital signs reviewed and stable  Post vital signs: Reviewed and stable  Complications: No apparent anesthesia complications

## 2013-08-22 NOTE — Op Note (Signed)
Dictation Number [id number]

## 2013-08-23 ENCOUNTER — Encounter (HOSPITAL_BASED_OUTPATIENT_CLINIC_OR_DEPARTMENT_OTHER): Payer: Self-pay | Admitting: Orthopedic Surgery

## 2013-08-23 LAB — POCT HEMOGLOBIN-HEMACUE: Hemoglobin: 15.5 g/dL — ABNORMAL HIGH (ref 12.0–15.0)

## 2013-08-23 NOTE — Op Note (Signed)
Molly Taylor, BELLANGER             ACCOUNT NO.:  [id number]  MEDICAL RECORD NO.:  [id number]  LOCATION:                                 FACILITY:  PHYSICIAN:  Daryll Brod, M.D.       DATE OF BIRTH:  [birth year]  DATE OF PROCEDURE:  08/22/2013 DATE OF DISCHARGE:                              OPERATIVE REPORT   PREOPERATIVE DIAGNOSES:  Mucoid tumor, degenerative arthritis, interphalangeal joint, right thumb.  POSTOPERATIVE DIAGNOSES:  Mucoid tumor, degenerative arthritis, interphalangeal joint, right thumb.  OPERATION:  Excision of mucoid tumor with debridement, synovectomy, interphalangeal joint, right thumb.  SURGEON:  Daryll Brod, M.D.  ANESTHESIA:  Forearm-based IV regional with local infiltration.  ANESTHESIOLOGIST:  Lorrene Reid, M.D.  HISTORY:  The patient is an 80 year old female with a history of a mass over the dorsal aspect of her right thumb.  She has degenerative arthritis on x-rays indicative with transillumination of a mucoid tumor. She is desirous having this excised.  Pre, peri, and postoperative course have been discussed along with risks and complications.  She is aware that there is no guarantee with the surgery, possibility of infection; recurrence of injury to arteries, nerves, tendons, incomplete relief of symptoms, dystrophy.  In preoperative area, the patient is seen, the extremity marked by both the patient and surgeon.  Antibiotic given.  PROCEDURE IN DETAIL:  The patient was brought to the operating room, where a forearm-based IV regional anesthetic was carried out without difficulty.  She was prepped using ChloraPrep, supine position with the right arm free.  A 3-minute dry time was allowed.  Time-out taken, confirming the patient and procedure.  A curvilinear incision was made over the distal interphalangeal joint of the thumb, carried down along the radial aspect middle laterally, carried down through subcutaneous tissue.  Bleeders were  meticulously cauterized with bipolar and that she was on Coumadin with elevated INR.  The cyst was immediately apparent. This was directly through a portion of the extensor tendon.  This area was opened.  The cyst was excised.  The joint was then debrided including a synovectomy with a small synovectomy rongeur.  The tendon was split longitudinally.  The wound copiously irrigated with saline. The skin was then closed with interrupted 4-0 Vicryl Rapide sutures and that it was felt that the tendon should heal without difficulty without being sutured together.  A sterile compressive dressing and splint to the IP joint was applied after a metacarpal block was given with 0.25% Marcaine without epinephrine, approximately 5-6 mL was used.  On deflation of the tourniquet, remaining fingers pinked.  She was taken to the recovery room for observation in satisfactory condition.  She will be discharged home to return to the [name] in 1 week on [facility].          ______________________________ Daryll Brod, M.D.     GK/MEDQ  D:  08/22/2013  T:  08/23/2013  Job:  [id number]

## 2013-08-24 ENCOUNTER — Other Ambulatory Visit: Payer: Self-pay | Admitting: Cardiology

## 2013-09-11 ENCOUNTER — Telehealth: Payer: Self-pay | Admitting: Cardiology

## 2013-09-11 NOTE — Telephone Encounter (Signed)
Spoke with pt she states she is back on her coumadin since surgery. Thus appt needed and scheduled for tomorrow since she states she can't come in today. She denies any new meds.

## 2013-09-11 NOTE — Telephone Encounter (Signed)
New problem    Pt want to know when does she need to come in for her next appt. Please call pt.

## 2013-09-12 ENCOUNTER — Ambulatory Visit (INDEPENDENT_AMBULATORY_CARE_PROVIDER_SITE_OTHER): Payer: Medicare Other | Admitting: *Deleted

## 2013-09-12 DIAGNOSIS — Z5181 Encounter for therapeutic drug level monitoring: Secondary | ICD-10-CM

## 2013-09-12 DIAGNOSIS — I4891 Unspecified atrial fibrillation: Secondary | ICD-10-CM | POA: Diagnosis not present

## 2013-09-12 LAB — POCT INR: INR: 2.5

## 2013-10-04 ENCOUNTER — Ambulatory Visit (INDEPENDENT_AMBULATORY_CARE_PROVIDER_SITE_OTHER): Payer: Medicare Other | Admitting: *Deleted

## 2013-10-04 DIAGNOSIS — I4891 Unspecified atrial fibrillation: Secondary | ICD-10-CM

## 2013-10-04 DIAGNOSIS — Z5181 Encounter for therapeutic drug level monitoring: Secondary | ICD-10-CM | POA: Diagnosis not present

## 2013-10-04 LAB — POCT INR: INR: 2

## 2013-10-10 ENCOUNTER — Encounter: Payer: Self-pay | Admitting: Cardiology

## 2013-10-25 ENCOUNTER — Ambulatory Visit (INDEPENDENT_AMBULATORY_CARE_PROVIDER_SITE_OTHER): Payer: Medicare Other

## 2013-10-25 DIAGNOSIS — I4891 Unspecified atrial fibrillation: Secondary | ICD-10-CM | POA: Diagnosis not present

## 2013-10-25 DIAGNOSIS — Z5181 Encounter for therapeutic drug level monitoring: Secondary | ICD-10-CM | POA: Diagnosis not present

## 2013-10-25 LAB — POCT INR: INR: 1.9

## 2013-11-08 ENCOUNTER — Ambulatory Visit (INDEPENDENT_AMBULATORY_CARE_PROVIDER_SITE_OTHER): Payer: Medicare Other | Admitting: *Deleted

## 2013-11-08 DIAGNOSIS — I495 Sick sinus syndrome: Secondary | ICD-10-CM

## 2013-11-08 DIAGNOSIS — I4891 Unspecified atrial fibrillation: Secondary | ICD-10-CM

## 2013-11-08 NOTE — Progress Notes (Signed)
Remote pacemaker transmission.   

## 2013-11-09 DIAGNOSIS — N390 Urinary tract infection, site not specified: Secondary | ICD-10-CM | POA: Diagnosis not present

## 2013-11-12 ENCOUNTER — Telehealth: Payer: Self-pay | Admitting: Pharmacist

## 2013-11-12 NOTE — Telephone Encounter (Signed)
Patient started on Cipro 500 mg bid on 11/09/13 PM for UTI.  Patient advised to continue warfarin dose and recheck INR tomorrow (day #5/#7 of cipro).  Appointment made.

## 2013-11-12 NOTE — Telephone Encounter (Signed)
New message         Pt was seen over the weekend at a medical clinic and they prescribed her Ciprofloxacin / Can she have this?

## 2013-11-13 ENCOUNTER — Ambulatory Visit (INDEPENDENT_AMBULATORY_CARE_PROVIDER_SITE_OTHER): Payer: Medicare Other

## 2013-11-13 DIAGNOSIS — Z5181 Encounter for therapeutic drug level monitoring: Secondary | ICD-10-CM | POA: Diagnosis not present

## 2013-11-13 DIAGNOSIS — I4891 Unspecified atrial fibrillation: Secondary | ICD-10-CM

## 2013-11-13 LAB — POCT INR: INR: 1.6

## 2013-11-14 LAB — MDC_IDC_ENUM_SESS_TYPE_REMOTE
Battery Impedance: 100 Ohm
Battery Voltage: 2.79 V
Brady Statistic AP VP Percent: 97 %
Brady Statistic AP VS Percent: 0 %
Brady Statistic AS VS Percent: 0 %
Date Time Interrogation Session: 20150820143324
Lead Channel Impedance Value: 522 Ohm
Lead Channel Pacing Threshold Pulse Width: 0.4 ms
Lead Channel Setting Pacing Amplitude: 2.5 V
Lead Channel Setting Pacing Pulse Width: 0.4 ms
Lead Channel Setting Sensing Sensitivity: 2.8 mV
MDC IDC MSMT BATTERY REMAINING LONGEVITY: 127 mo
MDC IDC MSMT LEADCHNL RV IMPEDANCE VALUE: 601 Ohm
MDC IDC MSMT LEADCHNL RV PACING THRESHOLD AMPLITUDE: 0.75 V
MDC IDC SET LEADCHNL RA PACING AMPLITUDE: 2 V
MDC IDC STAT BRADY AS VP PERCENT: 3 %

## 2013-11-19 ENCOUNTER — Other Ambulatory Visit: Payer: Self-pay | Admitting: *Deleted

## 2013-11-19 MED ORDER — METOPROLOL SUCCINATE ER 25 MG PO TB24: ORAL_TABLET | ORAL | Status: DC

## 2013-11-19 MED ORDER — SOTALOL HCL 160 MG PO TABS: ORAL_TABLET | ORAL | Status: DC

## 2013-11-19 MED ORDER — WARFARIN SODIUM 6 MG PO TABS: ORAL_TABLET | ORAL | Status: DC

## 2013-11-20 ENCOUNTER — Encounter: Payer: Self-pay | Admitting: Cardiology

## 2013-11-26 ENCOUNTER — Encounter: Payer: Self-pay | Admitting: Internal Medicine

## 2013-11-27 ENCOUNTER — Ambulatory Visit (INDEPENDENT_AMBULATORY_CARE_PROVIDER_SITE_OTHER): Payer: Medicare Other | Admitting: *Deleted

## 2013-11-27 DIAGNOSIS — I4891 Unspecified atrial fibrillation: Secondary | ICD-10-CM

## 2013-11-27 DIAGNOSIS — Z5181 Encounter for therapeutic drug level monitoring: Secondary | ICD-10-CM

## 2013-11-27 LAB — POCT INR: INR: 2.4

## 2013-12-19 ENCOUNTER — Ambulatory Visit (INDEPENDENT_AMBULATORY_CARE_PROVIDER_SITE_OTHER): Payer: Medicare Other | Admitting: Pharmacist

## 2013-12-19 DIAGNOSIS — Z5181 Encounter for therapeutic drug level monitoring: Secondary | ICD-10-CM | POA: Diagnosis not present

## 2013-12-19 DIAGNOSIS — I4891 Unspecified atrial fibrillation: Secondary | ICD-10-CM

## 2013-12-19 LAB — POCT INR: INR: 2.7

## 2013-12-27 DIAGNOSIS — Z23 Encounter for immunization: Secondary | ICD-10-CM | POA: Diagnosis not present

## 2014-01-14 DIAGNOSIS — J309 Allergic rhinitis, unspecified: Secondary | ICD-10-CM | POA: Diagnosis not present

## 2014-01-14 DIAGNOSIS — I480 Paroxysmal atrial fibrillation: Secondary | ICD-10-CM | POA: Diagnosis not present

## 2014-01-14 DIAGNOSIS — N3000 Acute cystitis without hematuria: Secondary | ICD-10-CM | POA: Diagnosis not present

## 2014-01-14 DIAGNOSIS — K219 Gastro-esophageal reflux disease without esophagitis: Secondary | ICD-10-CM | POA: Diagnosis not present

## 2014-01-14 DIAGNOSIS — N3281 Overactive bladder: Secondary | ICD-10-CM | POA: Diagnosis not present

## 2014-01-14 DIAGNOSIS — M150 Primary generalized (osteo)arthritis: Secondary | ICD-10-CM | POA: Diagnosis not present

## 2014-01-14 DIAGNOSIS — I10 Essential (primary) hypertension: Secondary | ICD-10-CM | POA: Diagnosis not present

## 2014-01-16 ENCOUNTER — Ambulatory Visit (INDEPENDENT_AMBULATORY_CARE_PROVIDER_SITE_OTHER): Payer: Medicare Other | Admitting: *Deleted

## 2014-01-16 DIAGNOSIS — I4891 Unspecified atrial fibrillation: Secondary | ICD-10-CM | POA: Diagnosis not present

## 2014-01-16 DIAGNOSIS — Z5181 Encounter for therapeutic drug level monitoring: Secondary | ICD-10-CM

## 2014-01-16 LAB — POCT INR: INR: 1.7

## 2014-01-18 ENCOUNTER — Telehealth: Payer: Self-pay | Admitting: Internal Medicine

## 2014-01-18 NOTE — Telephone Encounter (Signed)
New message      Pt is on coumadin and need to report that she is also on nitrofurantoin 100mg  bid.  Will her dosage need to be adjusted?

## 2014-01-18 NOTE — Telephone Encounter (Signed)
Spoke with pt.  Nitrofurantoin will not interfere with Coumadin.  Ok to continue current dose and keep appt for 11/4

## 2014-01-23 ENCOUNTER — Ambulatory Visit (INDEPENDENT_AMBULATORY_CARE_PROVIDER_SITE_OTHER): Payer: Medicare Other | Admitting: Pharmacist

## 2014-01-23 DIAGNOSIS — Z5181 Encounter for therapeutic drug level monitoring: Secondary | ICD-10-CM | POA: Diagnosis not present

## 2014-01-23 DIAGNOSIS — I4891 Unspecified atrial fibrillation: Secondary | ICD-10-CM | POA: Diagnosis not present

## 2014-01-23 LAB — POCT INR: INR: 2.7

## 2014-01-23 MED ORDER — DILTIAZEM HCL ER COATED BEADS 240 MG PO CP24: ORAL_CAPSULE | ORAL | Status: DC

## 2014-01-24 ENCOUNTER — Other Ambulatory Visit: Payer: Self-pay

## 2014-01-24 MED ORDER — DILTIAZEM HCL ER COATED BEADS 240 MG PO CP24: ORAL_CAPSULE | ORAL | Status: DC

## 2014-01-28 DIAGNOSIS — N3000 Acute cystitis without hematuria: Secondary | ICD-10-CM | POA: Diagnosis not present

## 2014-02-11 ENCOUNTER — Encounter: Payer: Self-pay | Admitting: Internal Medicine

## 2014-02-11 ENCOUNTER — Ambulatory Visit (INDEPENDENT_AMBULATORY_CARE_PROVIDER_SITE_OTHER): Payer: Medicare Other | Admitting: *Deleted

## 2014-02-11 DIAGNOSIS — I495 Sick sinus syndrome: Secondary | ICD-10-CM

## 2014-02-11 LAB — MDC_IDC_ENUM_SESS_TYPE_REMOTE
Battery Voltage: 2.79 V
Brady Statistic AP VS Percent: 0 %
Brady Statistic AS VP Percent: 3 %
Brady Statistic AS VS Percent: 0 %
Date Time Interrogation Session: 20151123161519
Lead Channel Impedance Value: 514 Ohm
Lead Channel Impedance Value: 601 Ohm
Lead Channel Pacing Threshold Amplitude: 0.75 V
Lead Channel Pacing Threshold Pulse Width: 0.4 ms
Lead Channel Setting Pacing Amplitude: 2.5 V
Lead Channel Setting Pacing Pulse Width: 0.4 ms
Lead Channel Setting Sensing Sensitivity: 4 mV
MDC IDC MSMT BATTERY IMPEDANCE: 112 Ohm
MDC IDC MSMT BATTERY REMAINING LONGEVITY: 123 mo
MDC IDC SET LEADCHNL RA PACING AMPLITUDE: 2 V
MDC IDC STAT BRADY AP VP PERCENT: 97 %

## 2014-02-11 NOTE — Progress Notes (Signed)
Remote pacemaker transmission.   

## 2014-02-20 ENCOUNTER — Ambulatory Visit (INDEPENDENT_AMBULATORY_CARE_PROVIDER_SITE_OTHER): Payer: Medicare Other | Admitting: *Deleted

## 2014-02-20 DIAGNOSIS — Z5181 Encounter for therapeutic drug level monitoring: Secondary | ICD-10-CM | POA: Diagnosis not present

## 2014-02-20 DIAGNOSIS — I4891 Unspecified atrial fibrillation: Secondary | ICD-10-CM | POA: Diagnosis not present

## 2014-02-20 LAB — POCT INR: INR: 3.1

## 2014-02-26 ENCOUNTER — Other Ambulatory Visit: Payer: Self-pay

## 2014-02-26 MED ORDER — METOPROLOL SUCCINATE ER 25 MG PO TB24: ORAL_TABLET | ORAL | Status: DC

## 2014-02-28 ENCOUNTER — Encounter (HOSPITAL_COMMUNITY): Payer: Self-pay | Admitting: Internal Medicine

## 2014-03-05 ENCOUNTER — Ambulatory Visit (INDEPENDENT_AMBULATORY_CARE_PROVIDER_SITE_OTHER): Payer: Medicare Other

## 2014-03-05 DIAGNOSIS — M79673 Pain in unspecified foot: Secondary | ICD-10-CM

## 2014-03-05 DIAGNOSIS — B351 Tinea unguium: Secondary | ICD-10-CM | POA: Diagnosis not present

## 2014-03-05 NOTE — Progress Notes (Signed)
   Subjective:    Patient ID: Molly Taylor, female    DOB: 08/23/1926, 80 y.o.   MRN: [id number]  HPI Pt presents for nail debridement   Review of Systems no new findings or systemic changes noted     Objective:   Physical Exam Neurovascular status is unchanged pedal pulses palpable DP +2 PT plus one over 4 Refill time 3 seconds epicritic and proprioceptive sensations diminished there is thick Crumley deformed criptotic nails 1 through 5 bilateral his does have history of vascular disease and compromise. No open wounds no ulcers no secondary infections       Assessment & Plan:  Assessment this time is onychomycosis painful mycotic nails debrided neurovascular status is intact pedal pulses palpable pedal greatly but diminished there is some tenderness on palpation of the hallux second third digits bilateral this time previously nonpainful symptomatically this time does have some discomfort friability painful friable mycotic nails debrided 6 return for future palliative nail care is needed  Harriet Masson DPM

## 2014-03-06 ENCOUNTER — Encounter: Payer: Self-pay | Admitting: Cardiology

## 2014-03-20 ENCOUNTER — Ambulatory Visit (INDEPENDENT_AMBULATORY_CARE_PROVIDER_SITE_OTHER): Payer: Medicare Other | Admitting: Surgery

## 2014-03-20 DIAGNOSIS — I4891 Unspecified atrial fibrillation: Secondary | ICD-10-CM | POA: Diagnosis not present

## 2014-03-20 DIAGNOSIS — Z5181 Encounter for therapeutic drug level monitoring: Secondary | ICD-10-CM

## 2014-03-20 LAB — POCT INR: INR: 2.2

## 2014-04-01 ENCOUNTER — Telehealth: Payer: Self-pay

## 2014-04-01 ENCOUNTER — Other Ambulatory Visit: Payer: Self-pay

## 2014-04-01 ENCOUNTER — Other Ambulatory Visit: Payer: Self-pay | Admitting: Surgery

## 2014-04-01 MED ORDER — SOTALOL HCL 160 MG PO TABS: ORAL_TABLET | ORAL | Status: DC

## 2014-04-01 MED ORDER — WARFARIN SODIUM 6 MG PO TABS: ORAL_TABLET | ORAL | Status: DC

## 2014-04-03 NOTE — Telephone Encounter (Signed)
Refill

## 2014-04-24 ENCOUNTER — Ambulatory Visit (INDEPENDENT_AMBULATORY_CARE_PROVIDER_SITE_OTHER): Payer: Medicare Other | Admitting: Pharmacist

## 2014-04-24 DIAGNOSIS — Z5181 Encounter for therapeutic drug level monitoring: Secondary | ICD-10-CM | POA: Diagnosis not present

## 2014-04-24 DIAGNOSIS — I4891 Unspecified atrial fibrillation: Secondary | ICD-10-CM

## 2014-04-24 LAB — POCT INR: INR: 2.1

## 2014-05-08 ENCOUNTER — Encounter: Payer: Self-pay | Admitting: Internal Medicine

## 2014-05-08 ENCOUNTER — Ambulatory Visit (INDEPENDENT_AMBULATORY_CARE_PROVIDER_SITE_OTHER): Payer: Medicare Other | Admitting: Internal Medicine

## 2014-05-08 VITALS — BP 110/72 | HR 84 | Ht 65.0 in | Wt 171.0 lb

## 2014-05-08 DIAGNOSIS — Z950 Presence of cardiac pacemaker: Secondary | ICD-10-CM

## 2014-05-08 DIAGNOSIS — Z45018 Encounter for adjustment and management of other part of cardiac pacemaker: Secondary | ICD-10-CM | POA: Diagnosis not present

## 2014-05-08 DIAGNOSIS — I495 Sick sinus syndrome: Secondary | ICD-10-CM | POA: Diagnosis not present

## 2014-05-08 DIAGNOSIS — I480 Paroxysmal atrial fibrillation: Secondary | ICD-10-CM

## 2014-05-08 DIAGNOSIS — I10 Essential (primary) hypertension: Secondary | ICD-10-CM

## 2014-05-08 LAB — MDC_IDC_ENUM_SESS_TYPE_INCLINIC
Battery Remaining Longevity: 124 mo
Battery Voltage: 2.79 V
Brady Statistic AP VS Percent: 0 %
Brady Statistic AS VP Percent: 3 %
Lead Channel Impedance Value: 613 Ohm
Lead Channel Pacing Threshold Amplitude: 0.5 V
Lead Channel Pacing Threshold Amplitude: 0.5 V
Lead Channel Pacing Threshold Pulse Width: 0.4 ms
Lead Channel Pacing Threshold Pulse Width: 0.4 ms
Lead Channel Setting Pacing Amplitude: 2 V
Lead Channel Setting Pacing Amplitude: 2.5 V
MDC IDC MSMT BATTERY IMPEDANCE: 112 Ohm
MDC IDC MSMT LEADCHNL RA IMPEDANCE VALUE: 506 Ohm
MDC IDC SESS DTM: 20160217110939
MDC IDC SET LEADCHNL RV PACING PULSEWIDTH: 0.4 ms
MDC IDC SET LEADCHNL RV SENSING SENSITIVITY: 4 mV
MDC IDC STAT BRADY AP VP PERCENT: 97 %
MDC IDC STAT BRADY AS VS PERCENT: 0 %

## 2014-05-08 NOTE — Assessment & Plan Note (Signed)
Her Medtronic dual-chamber pacemaker is working normally. She has approximately 10 years of battery longevity. We'll plan to recheck in several months.

## 2014-05-08 NOTE — Assessment & Plan Note (Signed)
Her blood pressure is well controlled. She will continue a low-sodium diet, maintain her current active lifestyle, and continue her current medications.

## 2014-05-08 NOTE — Progress Notes (Signed)
HPI Mrs. Douse returns today for followup. She is a pleasant 81 yo woman with symptomatic bradycardia, s/p PPM generator change out one year ago. Since then, she has been stable.  She denies chest pain or sob. No syncope. Minimal peripheral edema. Her blood pressure has been stable. She has maintained NSR on sotalol. Allergies  Allergen Reactions  . Codeine Nausea And Vomiting  . Sulfa Antibiotics Swelling     Current Outpatient Prescriptions  Medication Sig Dispense Refill  . acetaminophen (TYLENOL) 650 MG suppository Place 650 mg rectally daily as needed. For pain    . COD LIVER OIL PO Take 1 tablet by mouth daily.     Marland Kitchen diltiazem (CARDIZEM CD) 240 MG 24 hr capsule Take 1 capsule by mouth once daily 30 capsule 6  . furosemide (LASIX) 40 MG tablet Take 40 mg by mouth as needed.     . Melatonin 5 MG TABS Take 1 tablet by mouth at bedtime as needed.    . metoprolol succinate (TOPROL-XL) 25 MG 24 hr tablet take 1 tablet by mouth twice a day 180 tablet 3  . multivitamin-iron-minerals-folic acid (CENTRUM) chewable tablet Chew 1 tablet by mouth daily.     . pseudoephedrine-guaifenesin (MUCINEX D) 60-600 MG per tablet Take 1 tablet by mouth 2 (two) times daily as needed for congestion.     . sotalol (BETAPACE) 160 MG tablet take 1 tablet by mouth twice a day 180 tablet 0  . warfarin (COUMADIN) 6 MG tablet Take as directed by anticoagulation clinic 30 tablet 3  . loratadine (CLARITIN) 10 MG tablet Take 10 mg by mouth daily.      No current facility-administered medications for this visit.     Past Medical History  Diagnosis Date  . Allergic rhinitis, cause unspecified   . Hypercholesteremia   . GERD (gastroesophageal reflux disease)   . Hemorrhage of rectum and anus   . Generalized osteoarthrosis, unspecified site   . Hypertonicity of bladder   . HTN (hypertension)   . Chronic anticoagulation   . Pancreatitis, gallstone   . Renal cell cancer   . A-fib   . Tachy-brady  syndrome     s/p PPM  . Wears glasses     reading  . Wears partial dentures     upper partial    ROS:   All systems reviewed and negative except as noted in the HPI.   Past Surgical History  Procedure Laterality Date  . Left knee replacement - dr. French Ana  2012    lt total knee  . Total abdominal hysterectomy w/ bilateral salpingoophorectomy    . Pacemaker generator change  04/2013    MDT ADDRL1 pacemaker implanted by Dr Lovena Le  . Laparoscopic cholecystectomy  2007    lap choli  . Tubal ligation    . Thumb arthroscopy  2007    right  . Pacemaker insertion  2007  . Vein ligation    . Proximal interphalangeal fusion (pip) Right 08/22/2013    Procedure: EXCISION MUCOID CYST DEBRIDEMENT PROXIMAL INTERPHALANGEAL JOINT;  Surgeon: Wynonia Sours, MD;  Location: [facility];  Service: Orthopedics;  Laterality: Right;  . Permanent pacemaker generator change N/A 05/03/2013    Procedure: PERMANENT PACEMAKER GENERATOR CHANGE;  Surgeon: Evans Lance, MD;  Location: [facility] CATH LAB;  Service: Cardiovascular;  Laterality: N/A;     No family history on file.   History   Social History  . Marital Status: Married  Spouse Name: N/A  . Number of Children: N/A  . Years of Education: N/A   Occupational History  . Not on file.   Social History Main Topics  . Smoking status: Never Smoker   . Smokeless tobacco: Not on file  . Alcohol Use: No  . Drug Use: No  . Sexual Activity: Not on file   Other Topics Concern  . Not on file   Social History Narrative     BP 110/72 mmHg  Pulse 84  Ht 5\' 5"  (1.651 m)  Wt 171 lb (77.565 kg)  BMI 28.46 kg/m2  Physical Exam:  Well appearing elderly woman, looking younger than her stated age, NAD HEENT: Unremarkable Neck:   7 cm JVD, no thyromegall Back:  No CVA tenderness Lungs:  Clear with no wheezes HEART:  Regular rate rhythm, no murmurs, no rubs, no clicks Abd:  soft, positive bowel sounds, no organomegally, no rebound, no  guarding Ext:  2 plus pulses, no edema, no cyanosis, no clubbing Skin:  No rashes no nodules Neuro:  CN II through XII intact, motor grossly intact   DEVICE  Normal device function.  See PaceArt for details.   Assess/Plan:

## 2014-05-08 NOTE — Patient Instructions (Signed)
Your physician recommends that you continue on your current medications as directed. Please refer to the Current Medication list given to you today.  Remote monitoring is used to monitor your Pacemaker of ICD from home. This monitoring reduces the number of office visits required to check your device to one time per year. It allows [place] to keep an eye on the functioning of your device to ensure it is working properly. You are scheduled for a device check from home on 08/07/14. You may send your transmission at any time that day. If you have a wireless device, the transmission will be sent automatically. After your physician reviews your transmission, you will receive a postcard with your next transmission date.  Your physician wants you to follow-up in: 1 year with Dr. Lovena Le.  You will receive a reminder letter in the mail two months in advance. If you don't receive a letter, please call our office to schedule the follow-up appointment.

## 2014-05-08 NOTE — Assessment & Plan Note (Signed)
She is maintaining sinus rhythm approximately 90% of the time. She will continue sotalol. Her atrial fibrillation appears to be asymptomatic.

## 2014-05-20 DIAGNOSIS — M25562 Pain in left knee: Secondary | ICD-10-CM | POA: Diagnosis not present

## 2014-05-20 DIAGNOSIS — M25561 Pain in right knee: Secondary | ICD-10-CM | POA: Diagnosis not present

## 2014-05-23 DIAGNOSIS — M6281 Muscle weakness (generalized): Secondary | ICD-10-CM | POA: Diagnosis not present

## 2014-05-23 DIAGNOSIS — M25569 Pain in unspecified knee: Secondary | ICD-10-CM | POA: Diagnosis not present

## 2014-05-23 DIAGNOSIS — M1711 Unilateral primary osteoarthritis, right knee: Secondary | ICD-10-CM | POA: Diagnosis not present

## 2014-06-06 ENCOUNTER — Ambulatory Visit (INDEPENDENT_AMBULATORY_CARE_PROVIDER_SITE_OTHER): Payer: Medicare Other | Admitting: Surgery

## 2014-06-06 DIAGNOSIS — I4891 Unspecified atrial fibrillation: Secondary | ICD-10-CM

## 2014-06-06 DIAGNOSIS — Z5181 Encounter for therapeutic drug level monitoring: Secondary | ICD-10-CM

## 2014-06-06 DIAGNOSIS — I480 Paroxysmal atrial fibrillation: Secondary | ICD-10-CM

## 2014-06-06 LAB — POCT INR: INR: 1.4

## 2014-06-14 ENCOUNTER — Ambulatory Visit (INDEPENDENT_AMBULATORY_CARE_PROVIDER_SITE_OTHER): Payer: Medicare Other | Admitting: *Deleted

## 2014-06-14 DIAGNOSIS — I480 Paroxysmal atrial fibrillation: Secondary | ICD-10-CM | POA: Diagnosis not present

## 2014-06-14 DIAGNOSIS — Z5181 Encounter for therapeutic drug level monitoring: Secondary | ICD-10-CM | POA: Diagnosis not present

## 2014-06-14 DIAGNOSIS — I4891 Unspecified atrial fibrillation: Secondary | ICD-10-CM

## 2014-06-14 LAB — POCT INR: INR: 2.3

## 2014-06-28 ENCOUNTER — Ambulatory Visit (INDEPENDENT_AMBULATORY_CARE_PROVIDER_SITE_OTHER): Payer: Medicare Other

## 2014-06-28 DIAGNOSIS — Z5181 Encounter for therapeutic drug level monitoring: Secondary | ICD-10-CM | POA: Diagnosis not present

## 2014-06-28 DIAGNOSIS — I4891 Unspecified atrial fibrillation: Secondary | ICD-10-CM | POA: Diagnosis not present

## 2014-06-28 LAB — POCT INR: INR: 2.5

## 2014-07-15 ENCOUNTER — Other Ambulatory Visit: Payer: Self-pay

## 2014-07-15 MED ORDER — SOTALOL HCL 160 MG PO TABS: ORAL_TABLET | ORAL | Status: DC

## 2014-07-26 ENCOUNTER — Ambulatory Visit (INDEPENDENT_AMBULATORY_CARE_PROVIDER_SITE_OTHER): Payer: Medicare Other | Admitting: *Deleted

## 2014-07-26 DIAGNOSIS — I4891 Unspecified atrial fibrillation: Secondary | ICD-10-CM | POA: Diagnosis not present

## 2014-07-26 DIAGNOSIS — Z5181 Encounter for therapeutic drug level monitoring: Secondary | ICD-10-CM

## 2014-07-26 LAB — POCT INR: INR: 3

## 2014-08-07 ENCOUNTER — Telehealth: Payer: Self-pay | Admitting: Cardiology

## 2014-08-07 ENCOUNTER — Encounter: Payer: Medicare Other | Admitting: *Deleted

## 2014-08-07 NOTE — Telephone Encounter (Signed)
Spoke with pt and reminded pt of remote transmission that is due today. Pt verbalized understanding.   

## 2014-08-09 ENCOUNTER — Encounter: Payer: Self-pay | Admitting: Cardiology

## 2014-08-14 ENCOUNTER — Ambulatory Visit (INDEPENDENT_AMBULATORY_CARE_PROVIDER_SITE_OTHER): Payer: Medicare Other | Admitting: *Deleted

## 2014-08-14 ENCOUNTER — Encounter: Payer: Self-pay | Admitting: Internal Medicine

## 2014-08-14 DIAGNOSIS — I495 Sick sinus syndrome: Secondary | ICD-10-CM | POA: Diagnosis not present

## 2014-08-16 ENCOUNTER — Telehealth: Payer: Self-pay | Admitting: Internal Medicine

## 2014-08-16 LAB — CUP PACEART REMOTE DEVICE CHECK
Battery Impedance: 112 Ohm
Battery Voltage: 2.79 V
Brady Statistic AP VP Percent: 96 %
Brady Statistic AP VS Percent: 0 %
Date Time Interrogation Session: 20160525142208
Lead Channel Impedance Value: 515 Ohm
Lead Channel Impedance Value: 597 Ohm
Lead Channel Setting Pacing Amplitude: 2 V
Lead Channel Setting Pacing Pulse Width: 0.46 ms
Lead Channel Setting Sensing Sensitivity: 5.6 mV
MDC IDC MSMT BATTERY REMAINING LONGEVITY: 120 mo
MDC IDC MSMT LEADCHNL RV PACING THRESHOLD AMPLITUDE: 0.625 V
MDC IDC MSMT LEADCHNL RV PACING THRESHOLD PULSEWIDTH: 0.4 ms
MDC IDC SET LEADCHNL RV PACING AMPLITUDE: 2.5 V
MDC IDC STAT BRADY AS VP PERCENT: 4 %
MDC IDC STAT BRADY AS VS PERCENT: 0 %

## 2014-08-16 NOTE — Telephone Encounter (Signed)
New Prob    Pt has some questions regarding last remote check. Please call.

## 2014-08-16 NOTE — Telephone Encounter (Signed)
Informed pt that transmission was received after letter was mailed out. Pt verbalized understanding.

## 2014-08-16 NOTE — Progress Notes (Signed)
Remote pacemaker transmission.   

## 2014-08-21 ENCOUNTER — Encounter: Payer: Self-pay | Admitting: Cardiology

## 2014-08-22 DIAGNOSIS — N3281 Overactive bladder: Secondary | ICD-10-CM | POA: Diagnosis not present

## 2014-08-22 DIAGNOSIS — R05 Cough: Secondary | ICD-10-CM | POA: Diagnosis not present

## 2014-08-24 DIAGNOSIS — H1031 Unspecified acute conjunctivitis, right eye: Secondary | ICD-10-CM | POA: Diagnosis not present

## 2014-09-03 DIAGNOSIS — L603 Nail dystrophy: Secondary | ICD-10-CM | POA: Diagnosis not present

## 2014-09-03 DIAGNOSIS — I739 Peripheral vascular disease, unspecified: Secondary | ICD-10-CM | POA: Diagnosis not present

## 2014-09-03 DIAGNOSIS — M79675 Pain in left toe(s): Secondary | ICD-10-CM | POA: Diagnosis not present

## 2014-09-03 DIAGNOSIS — M79674 Pain in right toe(s): Secondary | ICD-10-CM | POA: Diagnosis not present

## 2014-09-30 ENCOUNTER — Ambulatory Visit: Payer: Medicare Other | Admitting: Cardiology

## 2014-10-02 ENCOUNTER — Telehealth: Payer: Self-pay | Admitting: *Deleted

## 2014-10-02 NOTE — Telephone Encounter (Signed)
Patient needs to schedule an appointment in the [facility] clinic.  Left a message for her to call and schedule an appointment. She is overdue for follow-up in the [facility] clinic and needs a refill on Warfarin/Coumadin.

## 2014-10-09 ENCOUNTER — Ambulatory Visit (INDEPENDENT_AMBULATORY_CARE_PROVIDER_SITE_OTHER): Payer: Medicare Other | Admitting: *Deleted

## 2014-10-09 DIAGNOSIS — Z5181 Encounter for therapeutic drug level monitoring: Secondary | ICD-10-CM

## 2014-10-09 DIAGNOSIS — I4891 Unspecified atrial fibrillation: Secondary | ICD-10-CM | POA: Diagnosis not present

## 2014-10-09 LAB — POCT INR: INR: 2.4

## 2014-10-09 MED ORDER — WARFARIN SODIUM 6 MG PO TABS: ORAL_TABLET | ORAL | Status: DC

## 2014-10-10 DIAGNOSIS — M1711 Unilateral primary osteoarthritis, right knee: Secondary | ICD-10-CM | POA: Diagnosis not present

## 2014-10-23 DIAGNOSIS — Z961 Presence of intraocular lens: Secondary | ICD-10-CM | POA: Diagnosis not present

## 2014-10-31 ENCOUNTER — Encounter: Payer: Self-pay | Admitting: Cardiology

## 2014-11-08 DIAGNOSIS — R350 Frequency of micturition: Secondary | ICD-10-CM | POA: Diagnosis not present

## 2014-11-11 ENCOUNTER — Ambulatory Visit: Payer: Medicare Other | Admitting: Cardiology

## 2014-11-13 ENCOUNTER — Encounter: Payer: Self-pay | Admitting: Internal Medicine

## 2014-11-13 ENCOUNTER — Ambulatory Visit: Payer: Medicare Other | Admitting: Cardiology

## 2014-11-13 ENCOUNTER — Telehealth: Payer: Self-pay | Admitting: Cardiology

## 2014-11-13 ENCOUNTER — Ambulatory Visit (INDEPENDENT_AMBULATORY_CARE_PROVIDER_SITE_OTHER): Payer: Medicare Other | Admitting: *Deleted

## 2014-11-13 DIAGNOSIS — I495 Sick sinus syndrome: Secondary | ICD-10-CM

## 2014-11-13 NOTE — Telephone Encounter (Signed)
Spoke with pt and reminded pt of remote transmission that is due today. Pt verbalized understanding.   

## 2014-11-14 ENCOUNTER — Ambulatory Visit (INDEPENDENT_AMBULATORY_CARE_PROVIDER_SITE_OTHER): Payer: Medicare Other | Admitting: *Deleted

## 2014-11-14 ENCOUNTER — Encounter: Payer: Self-pay | Admitting: Cardiology

## 2014-11-14 ENCOUNTER — Ambulatory Visit (INDEPENDENT_AMBULATORY_CARE_PROVIDER_SITE_OTHER): Payer: Medicare Other | Admitting: Cardiology

## 2014-11-14 VITALS — BP 100/64 | HR 79 | Ht 65.0 in | Wt 165.8 lb

## 2014-11-14 DIAGNOSIS — I495 Sick sinus syndrome: Secondary | ICD-10-CM | POA: Diagnosis not present

## 2014-11-14 DIAGNOSIS — I10 Essential (primary) hypertension: Secondary | ICD-10-CM | POA: Diagnosis not present

## 2014-11-14 DIAGNOSIS — Z5181 Encounter for therapeutic drug level monitoring: Secondary | ICD-10-CM

## 2014-11-14 DIAGNOSIS — Z950 Presence of cardiac pacemaker: Secondary | ICD-10-CM | POA: Diagnosis not present

## 2014-11-14 DIAGNOSIS — I480 Paroxysmal atrial fibrillation: Secondary | ICD-10-CM | POA: Diagnosis not present

## 2014-11-14 DIAGNOSIS — I4891 Unspecified atrial fibrillation: Secondary | ICD-10-CM | POA: Diagnosis not present

## 2014-11-14 LAB — POCT INR: INR: 2.7

## 2014-11-14 MED ORDER — METOPROLOL SUCCINATE ER 25 MG PO TB24: 25.0000 mg | ORAL_TABLET | Freq: Every day | ORAL | Status: DC

## 2014-11-14 NOTE — Patient Instructions (Signed)
Medication Instructions:  Your physician has recommended you make the following change in your medication: 1) DECREASE TOPROL TO 25 mg daily  Labwork: None  Testing/Procedures: None  Follow-Up: Your physician wants you to follow-up in: 1 year with Dr. Radford Pax. You will receive a reminder letter in the mail two months in advance. If you don't receive a letter, please call our office to schedule the follow-up appointment.   Any Other Special Instructions Will Be Listed Below (If Applicable).

## 2014-11-14 NOTE — Progress Notes (Signed)
Remote pacemaker transmission.   

## 2014-11-14 NOTE — Progress Notes (Signed)
Cardiology Office Note   Date:  11/14/2014   ID:  Molly, Taylor 1926/11/04, MRN [id number]  PCP:  Donnie Coffin, MD    Chief Complaint  Patient presents with  . Follow-up    atrial fibrillation      History of Present Illness: Molly Taylor is a 81 y.o. female with a history of HTN, PAF, tachybrady syndrome s/p PPM and chronic systemic anticoagulation presents today for followup. She denies any chest pain or pressure. She denies any palpitations, SOB, DOE, syncope.  SHe complains some of dizziness when up and moving around.    She has chronic LE edema on occasion.     Past Medical History  Diagnosis Date  . Allergic rhinitis, cause unspecified   . Hypercholesteremia   . GERD (gastroesophageal reflux disease)   . Hemorrhage of rectum and anus   . Generalized osteoarthrosis, unspecified site   . Hypertonicity of bladder   . HTN (hypertension)   . Chronic anticoagulation   . Pancreatitis, gallstone   . Renal cell cancer   . A-fib   . Tachy-brady syndrome     s/p PPM  . Wears glasses     reading  . Wears partial dentures     upper partial    Past Surgical History  Procedure Laterality Date  . Left knee replacement - dr. French Ana  2012    lt total knee  . Total abdominal hysterectomy w/ bilateral salpingoophorectomy    . Pacemaker generator change  04/2013    MDT ADDRL1 pacemaker implanted by Dr Lovena Le  . Laparoscopic cholecystectomy  2007    lap choli  . Tubal ligation    . Thumb arthroscopy  2007    right  . Pacemaker insertion  2007  . Vein ligation    . Proximal interphalangeal fusion (pip) Right 08/22/2013    Procedure: EXCISION MUCOID CYST DEBRIDEMENT PROXIMAL INTERPHALANGEAL JOINT;  Surgeon: Wynonia Sours, MD;  Location: [facility];  Service: Orthopedics;  Laterality: Right;  . Permanent pacemaker generator change N/A 05/03/2013    Procedure: PERMANENT PACEMAKER GENERATOR CHANGE;  Surgeon: Evans Lance, MD;   Location: [facility] CATH LAB;  Service: Cardiovascular;  Laterality: N/A;     Current Outpatient Prescriptions  Medication Sig Dispense Refill  . acetaminophen (TYLENOL) 650 MG suppository Place 650 mg rectally daily as needed. For pain    . benzonatate (TESSALON) 200 MG capsule take 1 capsule by mouth three times a day for cough  0  . COD LIVER OIL PO Take 1 tablet by mouth daily.     Marland Kitchen diltiazem (CARDIZEM CD) 240 MG 24 hr capsule Take 1 capsule by mouth once daily 30 capsule 6  . loratadine (CLARITIN) 10 MG tablet Take 10 mg by mouth daily.     . Melatonin 5 MG TABS Take 1 tablet by mouth at bedtime as needed.    . metoprolol succinate (TOPROL-XL) 25 MG 24 hr tablet take 1 tablet by mouth twice a day 180 tablet 3  . multivitamin-iron-minerals-folic acid (CENTRUM) chewable tablet Chew 1 tablet by mouth daily.     . pseudoephedrine-guaifenesin (MUCINEX D) 60-600 MG per tablet Take 1 tablet by mouth 2 (two) times daily as needed for congestion.     . sotalol (BETAPACE) 160 MG tablet take 1 tablet by mouth twice a day 180 tablet 1  . tolterodine (DETROL) 2 MG tablet  1  . warfarin (COUMADIN) 6 MG tablet Take as directed by anticoagulation clinic 30 tablet 2   No current facility-administered medications for this visit.    Allergies:   Codeine and Sulfa antibiotics    Social History:  The patient  reports that she has never smoked. She does not have any smokeless tobacco history on file. She reports that she does not drink alcohol or use illicit drugs.   Family History:  The patient's family history is not on file.    ROS:  Please see the history of present illness.   Otherwise, review of systems are positive for none.   All other systems are reviewed and negative.    PHYSICAL EXAM: VS:  BP 100/64 mmHg  Pulse 79  Ht 5\' 5"  (1.651 m)  Wt 165 lb 12.8 oz (75.206 kg)  BMI 27.59 kg/m2  SpO2 93% , BMI Body mass index is 27.59 kg/(m^2). GEN: Well nourished, well developed, in no acute  distress HEENT: normal Neck: no JVD, carotid bruits, or masses Cardiac: RRR; no murmurs, rubs, or gallops,no edema  Respiratory:  clear to auscultation bilaterally, normal work of breathing GI: soft, nontender, nondistended, + BS MS: no deformity or atrophy Skin: warm and dry, no rash Neuro:  Strength and sensation are intact Psych: euthymic mood, full affect   EKG:  EKG is ordered today. The ekg ordered today demonstrates AV paced rhythm   Recent Labs: No results found for requested labs within last 365 days.    Lipid Panel No results found for: CHOL, TRIG, HDL, CHOLHDL, VLDL, LDLCALC, LDLDIRECT    Wt Readings from Last 3 Encounters:  11/14/14 165 lb 12.8 oz (75.206 kg)  05/08/14 171 lb (77.565 kg)  08/22/13 171 lb (77.565 kg)    ASSESSMENT AND PLAN:  1. PAF maintaining AV paced rhythm - continue diltiazem/metoprolol/warfarin/sotolol  2. HTN - controlled but on the soft side - continue diltiazem  - decrease Toprol to once daily due to soft BP and her tendency for dizzy spells. 3. Tachybrady syndrome s/p PPM    Current medicines are reviewed at length with the patient today.  The patient does not have concerns regarding medicines.  The following changes have been made:  no change  Labs/ tests ordered today: See above Assessment and Plan No orders of the defined types were placed in this encounter.     Disposition:   FU with me in 1 year  Signed, Sueanne Margarita, MD  11/14/2014 11:04 AM    [facility] [place], [place], [place]  [address] Phone: [phone]; Fax: [phone]

## 2014-11-18 DIAGNOSIS — I739 Peripheral vascular disease, unspecified: Secondary | ICD-10-CM | POA: Diagnosis not present

## 2014-11-18 DIAGNOSIS — L603 Nail dystrophy: Secondary | ICD-10-CM | POA: Diagnosis not present

## 2014-11-19 ENCOUNTER — Telehealth: Payer: Self-pay

## 2014-11-19 DIAGNOSIS — M1711 Unilateral primary osteoarthritis, right knee: Secondary | ICD-10-CM | POA: Diagnosis not present

## 2014-11-19 NOTE — Telephone Encounter (Signed)
Pt called in regarding her pacemaker. She would not explain what she needed because somehow she got transferred to refills but was trying to reach Dr. Lovena Le nurse. Told her I would send message to have someone give her a call back. Her phone number in chart is best call back number.

## 2014-11-19 NOTE — Telephone Encounter (Signed)
Pt states she was relaxing at home when she reached for something over her pacemaker monitor. She states her monitor audibly stated, "pacemaker's off" twice. Pt has 24950 model. I let her know it does not have verbal abilities, just visual. Pt concerned her device had turned off. I reassured her that the device cannot be turned off nor programmed while at home. Her monitor is a read only unit. Pt thankful for reassurance.   Remote was received on 11/13/14. At initial review, no irregularities noted. Pt aware that if anything of concern is discovered, we will call her.

## 2014-11-20 LAB — CUP PACEART REMOTE DEVICE CHECK
Brady Statistic AP VP Percent: 97 %
Brady Statistic AP VS Percent: 0 %
Brady Statistic AS VS Percent: 0 %
Date Time Interrogation Session: 20160824170148
Lead Channel Impedance Value: 506 Ohm
Lead Channel Impedance Value: 627 Ohm
Lead Channel Pacing Threshold Amplitude: 0.75 V
Lead Channel Setting Pacing Amplitude: 2 V
Lead Channel Setting Pacing Pulse Width: 0.4 ms
MDC IDC MSMT BATTERY IMPEDANCE: 136 Ohm
MDC IDC MSMT BATTERY REMAINING LONGEVITY: 117 mo
MDC IDC MSMT BATTERY VOLTAGE: 2.79 V
MDC IDC MSMT LEADCHNL RV PACING THRESHOLD PULSEWIDTH: 0.4 ms
MDC IDC SET LEADCHNL RV PACING AMPLITUDE: 2.5 V
MDC IDC SET LEADCHNL RV SENSING SENSITIVITY: 5.6 mV
MDC IDC STAT BRADY AS VP PERCENT: 3 %

## 2014-11-26 DIAGNOSIS — M1711 Unilateral primary osteoarthritis, right knee: Secondary | ICD-10-CM | POA: Diagnosis not present

## 2014-12-03 DIAGNOSIS — M1711 Unilateral primary osteoarthritis, right knee: Secondary | ICD-10-CM | POA: Diagnosis not present

## 2014-12-04 ENCOUNTER — Encounter: Payer: Self-pay | Admitting: Cardiology

## 2014-12-26 ENCOUNTER — Ambulatory Visit (INDEPENDENT_AMBULATORY_CARE_PROVIDER_SITE_OTHER): Payer: Medicare Other | Admitting: Pharmacist

## 2014-12-26 DIAGNOSIS — I4891 Unspecified atrial fibrillation: Secondary | ICD-10-CM

## 2014-12-26 DIAGNOSIS — Z5181 Encounter for therapeutic drug level monitoring: Secondary | ICD-10-CM

## 2014-12-26 LAB — POCT INR: INR: 3.2

## 2014-12-31 DIAGNOSIS — Z23 Encounter for immunization: Secondary | ICD-10-CM | POA: Diagnosis not present

## 2015-01-16 DIAGNOSIS — J309 Allergic rhinitis, unspecified: Secondary | ICD-10-CM | POA: Diagnosis not present

## 2015-01-16 DIAGNOSIS — I480 Paroxysmal atrial fibrillation: Secondary | ICD-10-CM | POA: Diagnosis not present

## 2015-01-16 DIAGNOSIS — D489 Neoplasm of uncertain behavior, unspecified: Secondary | ICD-10-CM | POA: Diagnosis not present

## 2015-01-16 DIAGNOSIS — M150 Primary generalized (osteo)arthritis: Secondary | ICD-10-CM | POA: Diagnosis not present

## 2015-01-16 DIAGNOSIS — I10 Essential (primary) hypertension: Secondary | ICD-10-CM | POA: Diagnosis not present

## 2015-01-16 DIAGNOSIS — K219 Gastro-esophageal reflux disease without esophagitis: Secondary | ICD-10-CM | POA: Diagnosis not present

## 2015-01-16 DIAGNOSIS — N3281 Overactive bladder: Secondary | ICD-10-CM | POA: Diagnosis not present

## 2015-01-22 ENCOUNTER — Encounter: Payer: Self-pay | Admitting: Cardiology

## 2015-01-27 ENCOUNTER — Other Ambulatory Visit: Payer: Self-pay | Admitting: *Deleted

## 2015-01-27 MED ORDER — WARFARIN SODIUM 6 MG PO TABS: ORAL_TABLET | ORAL | Status: DC

## 2015-01-30 DIAGNOSIS — L603 Nail dystrophy: Secondary | ICD-10-CM | POA: Diagnosis not present

## 2015-01-30 DIAGNOSIS — I739 Peripheral vascular disease, unspecified: Secondary | ICD-10-CM | POA: Diagnosis not present

## 2015-01-31 ENCOUNTER — Ambulatory Visit (INDEPENDENT_AMBULATORY_CARE_PROVIDER_SITE_OTHER): Payer: Medicare Other | Admitting: *Deleted

## 2015-01-31 DIAGNOSIS — Z5181 Encounter for therapeutic drug level monitoring: Secondary | ICD-10-CM

## 2015-01-31 DIAGNOSIS — I4891 Unspecified atrial fibrillation: Secondary | ICD-10-CM | POA: Diagnosis not present

## 2015-01-31 LAB — POCT INR: INR: 3.8

## 2015-02-03 ENCOUNTER — Other Ambulatory Visit: Payer: Self-pay | Admitting: Family Medicine

## 2015-02-03 DIAGNOSIS — D2272 Melanocytic nevi of left lower limb, including hip: Secondary | ICD-10-CM | POA: Diagnosis not present

## 2015-02-06 ENCOUNTER — Other Ambulatory Visit: Payer: Self-pay | Admitting: Internal Medicine

## 2015-02-06 MED ORDER — DILTIAZEM HCL ER COATED BEADS 240 MG PO CP24: ORAL_CAPSULE | ORAL | Status: DC

## 2015-02-12 ENCOUNTER — Telehealth: Payer: Self-pay | Admitting: Cardiology

## 2015-02-12 ENCOUNTER — Ambulatory Visit (INDEPENDENT_AMBULATORY_CARE_PROVIDER_SITE_OTHER): Payer: Medicare Other | Admitting: *Deleted

## 2015-02-12 DIAGNOSIS — I495 Sick sinus syndrome: Secondary | ICD-10-CM

## 2015-02-12 NOTE — Telephone Encounter (Signed)
Confirmed remote transmission w/ pt daughter.   

## 2015-02-12 NOTE — Progress Notes (Signed)
Remote pacemaker transmission.   

## 2015-02-18 ENCOUNTER — Ambulatory Visit (INDEPENDENT_AMBULATORY_CARE_PROVIDER_SITE_OTHER): Payer: Medicare Other | Admitting: *Deleted

## 2015-02-18 DIAGNOSIS — Z5181 Encounter for therapeutic drug level monitoring: Secondary | ICD-10-CM | POA: Diagnosis not present

## 2015-02-18 DIAGNOSIS — I4891 Unspecified atrial fibrillation: Secondary | ICD-10-CM | POA: Diagnosis not present

## 2015-02-18 LAB — POCT INR: INR: 3.3

## 2015-02-24 LAB — CUP PACEART REMOTE DEVICE CHECK
Battery Impedance: 136 Ohm
Battery Remaining Longevity: 117 mo
Battery Voltage: 2.79 V
Brady Statistic AS VP Percent: 3 %
Implantable Lead Implant Date: 20070313
Implantable Lead Implant Date: 20070313
Implantable Lead Location: 753860
Implantable Lead Model: 5076
Implantable Lead Model: 5092
Lead Channel Pacing Threshold Amplitude: 0.75 V
Lead Channel Pacing Threshold Pulse Width: 0.4 ms
Lead Channel Setting Pacing Amplitude: 2 V
Lead Channel Setting Pacing Amplitude: 2.5 V
Lead Channel Setting Sensing Sensitivity: 5.6 mV
MDC IDC LEAD LOCATION: 753859
MDC IDC MSMT LEADCHNL RA IMPEDANCE VALUE: 478 Ohm
MDC IDC MSMT LEADCHNL RV IMPEDANCE VALUE: 625 Ohm
MDC IDC SESS DTM: 20161123153413
MDC IDC SET LEADCHNL RV PACING PULSEWIDTH: 0.4 ms
MDC IDC STAT BRADY AP VP PERCENT: 97 %
MDC IDC STAT BRADY AP VS PERCENT: 0 %
MDC IDC STAT BRADY AS VS PERCENT: 0 %

## 2015-02-25 ENCOUNTER — Encounter: Payer: Self-pay | Admitting: Cardiology

## 2015-03-04 ENCOUNTER — Ambulatory Visit (INDEPENDENT_AMBULATORY_CARE_PROVIDER_SITE_OTHER): Payer: Medicare Other | Admitting: *Deleted

## 2015-03-04 DIAGNOSIS — Z5181 Encounter for therapeutic drug level monitoring: Secondary | ICD-10-CM | POA: Diagnosis not present

## 2015-03-04 DIAGNOSIS — I4891 Unspecified atrial fibrillation: Secondary | ICD-10-CM | POA: Diagnosis not present

## 2015-03-04 LAB — POCT INR: INR: 3.1

## 2015-03-06 DIAGNOSIS — N390 Urinary tract infection, site not specified: Secondary | ICD-10-CM | POA: Diagnosis not present

## 2015-03-06 DIAGNOSIS — B9620 Unspecified Escherichia coli [E. coli] as the cause of diseases classified elsewhere: Secondary | ICD-10-CM | POA: Diagnosis not present

## 2015-03-18 ENCOUNTER — Ambulatory Visit (INDEPENDENT_AMBULATORY_CARE_PROVIDER_SITE_OTHER): Payer: Medicare Other | Admitting: Pharmacist

## 2015-03-18 DIAGNOSIS — Z5181 Encounter for therapeutic drug level monitoring: Secondary | ICD-10-CM

## 2015-03-18 DIAGNOSIS — I4891 Unspecified atrial fibrillation: Secondary | ICD-10-CM | POA: Diagnosis not present

## 2015-03-18 LAB — POCT INR: INR: 1.1

## 2015-03-26 DIAGNOSIS — B9620 Unspecified Escherichia coli [E. coli] as the cause of diseases classified elsewhere: Secondary | ICD-10-CM | POA: Diagnosis not present

## 2015-03-26 DIAGNOSIS — N390 Urinary tract infection, site not specified: Secondary | ICD-10-CM | POA: Diagnosis not present

## 2015-03-26 DIAGNOSIS — Z0000 Encounter for general adult medical examination without abnormal findings: Secondary | ICD-10-CM | POA: Diagnosis not present

## 2015-04-04 ENCOUNTER — Ambulatory Visit (INDEPENDENT_AMBULATORY_CARE_PROVIDER_SITE_OTHER): Payer: Medicare Other | Admitting: *Deleted

## 2015-04-04 DIAGNOSIS — I4891 Unspecified atrial fibrillation: Secondary | ICD-10-CM | POA: Diagnosis not present

## 2015-04-04 DIAGNOSIS — Z5181 Encounter for therapeutic drug level monitoring: Secondary | ICD-10-CM

## 2015-04-04 LAB — POCT INR: INR: 1.7

## 2015-04-18 ENCOUNTER — Ambulatory Visit (INDEPENDENT_AMBULATORY_CARE_PROVIDER_SITE_OTHER): Payer: Medicare Other | Admitting: *Deleted

## 2015-04-18 DIAGNOSIS — Z5181 Encounter for therapeutic drug level monitoring: Secondary | ICD-10-CM

## 2015-04-18 DIAGNOSIS — I4891 Unspecified atrial fibrillation: Secondary | ICD-10-CM | POA: Diagnosis not present

## 2015-04-18 LAB — POCT INR: INR: 2.1

## 2015-04-25 DIAGNOSIS — I739 Peripheral vascular disease, unspecified: Secondary | ICD-10-CM | POA: Diagnosis not present

## 2015-04-25 DIAGNOSIS — L603 Nail dystrophy: Secondary | ICD-10-CM | POA: Diagnosis not present

## 2015-05-09 ENCOUNTER — Ambulatory Visit (INDEPENDENT_AMBULATORY_CARE_PROVIDER_SITE_OTHER): Payer: Medicare Other | Admitting: *Deleted

## 2015-05-09 DIAGNOSIS — Z5181 Encounter for therapeutic drug level monitoring: Secondary | ICD-10-CM

## 2015-05-09 DIAGNOSIS — I4891 Unspecified atrial fibrillation: Secondary | ICD-10-CM | POA: Diagnosis not present

## 2015-05-09 LAB — POCT INR: INR: 2.3

## 2015-05-15 DIAGNOSIS — R609 Edema, unspecified: Secondary | ICD-10-CM | POA: Diagnosis not present

## 2015-06-12 ENCOUNTER — Ambulatory Visit (INDEPENDENT_AMBULATORY_CARE_PROVIDER_SITE_OTHER): Payer: Medicare Other | Admitting: Internal Medicine

## 2015-06-12 ENCOUNTER — Encounter: Payer: Self-pay | Admitting: Internal Medicine

## 2015-06-12 ENCOUNTER — Ambulatory Visit (INDEPENDENT_AMBULATORY_CARE_PROVIDER_SITE_OTHER): Payer: Medicare Other | Admitting: *Deleted

## 2015-06-12 VITALS — BP 118/76 | HR 88 | Ht 65.0 in | Wt 164.6 lb

## 2015-06-12 DIAGNOSIS — I480 Paroxysmal atrial fibrillation: Secondary | ICD-10-CM | POA: Diagnosis not present

## 2015-06-12 DIAGNOSIS — I4891 Unspecified atrial fibrillation: Secondary | ICD-10-CM

## 2015-06-12 DIAGNOSIS — Z950 Presence of cardiac pacemaker: Secondary | ICD-10-CM | POA: Diagnosis not present

## 2015-06-12 DIAGNOSIS — Z5181 Encounter for therapeutic drug level monitoring: Secondary | ICD-10-CM

## 2015-06-12 LAB — CUP PACEART INCLINIC DEVICE CHECK
Battery Impedance: 160 Ohm
Brady Statistic AP VS Percent: 0 %
Brady Statistic AS VP Percent: 9 %
Brady Statistic AS VS Percent: 0 %
Implantable Lead Implant Date: 20070313
Implantable Lead Location: 753859
Implantable Lead Location: 753860
Implantable Lead Model: 5092
Lead Channel Pacing Threshold Amplitude: 0.75 V
Lead Channel Pacing Threshold Pulse Width: 0.4 ms
Lead Channel Sensing Intrinsic Amplitude: 2.8 mV
Lead Channel Setting Pacing Pulse Width: 0.4 ms
MDC IDC LEAD IMPLANT DT: 20070313
MDC IDC MSMT BATTERY REMAINING LONGEVITY: 114 mo
MDC IDC MSMT BATTERY VOLTAGE: 2.79 V
MDC IDC MSMT LEADCHNL RA IMPEDANCE VALUE: 500 Ohm
MDC IDC MSMT LEADCHNL RV IMPEDANCE VALUE: 640 Ohm
MDC IDC SESS DTM: 20170323160410
MDC IDC SET LEADCHNL RA PACING AMPLITUDE: 2 V
MDC IDC SET LEADCHNL RV PACING AMPLITUDE: 2.5 V
MDC IDC SET LEADCHNL RV SENSING SENSITIVITY: 5.6 mV
MDC IDC STAT BRADY AP VP PERCENT: 90 %

## 2015-06-12 LAB — POCT INR: INR: 1.7

## 2015-06-12 MED ORDER — AMIODARONE HCL 200 MG PO TABS: 200.0000 mg | ORAL_TABLET | Freq: Two times a day (BID) | ORAL | Status: DC

## 2015-06-12 NOTE — Progress Notes (Signed)
HPI Mrs. Milone returns today for followup. She is a pleasant 82 yo woman with symptomatic bradycardia, s/p PPM generator change out two years ago. Since then, she has been stable.  She denies chest pain or syncope. Her peripheral edema has worsened over the past 3-4 weeks. Her blood pressure has been stable. She has reverted to atrial fibrillation during this time although she does not have palpitations. Her dyspnea has worsened as well. She denies dietary or medical non-compliance. Allergies  Allergen Reactions  . Codeine Nausea And Vomiting  . Sulfa Antibiotics Swelling     Current Outpatient Prescriptions  Medication Sig Dispense Refill  . acetaminophen (TYLENOL) 650 MG suppository Place 650 mg rectally daily as needed. For pain    . benzonatate (TESSALON) 200 MG capsule take 1 capsule by mouth three times a day for cough  0  . COD LIVER OIL PO Take 1 tablet by mouth daily.     Marland Kitchen diltiazem (CARDIZEM CD) 240 MG 24 hr capsule Take 1 capsule by mouth once daily 30 capsule 9  . loratadine (CLARITIN) 10 MG tablet Take 10 mg by mouth daily.     . Melatonin 5 MG TABS Take 1 tablet by mouth at bedtime as needed.    . metoprolol succinate (TOPROL-XL) 25 MG 24 hr tablet Take 1 tablet (25 mg total) by mouth daily. 90 tablet 3  . multivitamin-iron-minerals-folic acid (CENTRUM) chewable tablet Chew 1 tablet by mouth daily.     . nitrofurantoin, macrocrystal-monohydrate, (MACROBID) 100 MG capsule Take 1 capsule by mouth at bedtime.  0  . PREMARIN vaginal cream Apply 1 application topically at bedtime.  0  . pseudoephedrine-guaifenesin (MUCINEX D) 60-600 MG per tablet Take 1 tablet by mouth 2 (two) times daily as needed for congestion.     Marland Kitchen tolterodine (DETROL) 2 MG tablet   1  . warfarin (COUMADIN) 6 MG tablet Take as directed by anticoagulation clinic 30 tablet 3  . amiodarone (PACERONE) 200 MG tablet Take 1 tablet (200 mg total) by mouth 2 (two) times daily. 180 tablet 3   No current  facility-administered medications for this visit.     Past Medical History  Diagnosis Date  . Allergic rhinitis, cause unspecified   . Hypercholesteremia   . GERD (gastroesophageal reflux disease)   . Hemorrhage of rectum and anus   . Generalized osteoarthrosis, unspecified site   . Hypertonicity of bladder   . HTN (hypertension)   . Chronic anticoagulation   . Pancreatitis, gallstone   . Renal cell cancer ([place])   . A-fib ([place])   . Tachy-brady syndrome ([place])     s/p PPM  . Wears glasses     reading  . Wears partial dentures     upper partial    ROS:   All systems reviewed and negative except as noted in the HPI.   Past Surgical History  Procedure Laterality Date  . Left knee replacement - dr. French Ana  2012    lt total knee  . Total abdominal hysterectomy w/ bilateral salpingoophorectomy    . Pacemaker generator change  04/2013    MDT ADDRL1 pacemaker implanted by Dr Lovena Le  . Laparoscopic cholecystectomy  2007    lap choli  . Tubal ligation    . Thumb arthroscopy  2007    right  . Pacemaker insertion  2007  . Vein ligation    . Proximal interphalangeal fusion (pip) Right 08/22/2013    Procedure: EXCISION MUCOID CYST DEBRIDEMENT  PROXIMAL INTERPHALANGEAL JOINT;  Surgeon: Wynonia Sours, MD;  Location: [facility];  Service: Orthopedics;  Laterality: Right;  . Permanent pacemaker generator change N/A 05/03/2013    Procedure: PERMANENT PACEMAKER GENERATOR CHANGE;  Surgeon: Evans Lance, MD;  Location: [facility] CATH LAB;  Service: Cardiovascular;  Laterality: N/A;     No family history on file.   Social History   Social History  . Marital Status: Married    Spouse Name: N/A  . Number of Children: N/A  . Years of Education: N/A   Occupational History  . Not on file.   Social History Main Topics  . Smoking status: Never Smoker   . Smokeless tobacco: Not on file  . Alcohol Use: No  . Drug Use: No  . Sexual Activity: Not on file   Other Topics  Concern  . Not on file   Social History Narrative     BP 118/76 mmHg  Pulse 88  Ht 5\' 5"  (1.651 m)  Wt 164 lb 9.6 oz (74.662 kg)  BMI 27.39 kg/m2  Physical Exam:  stable appearing elderly woman, looking younger than her stated age, NAD HEENT: Unremarkable Neck:   8 cm JVD, no thyromegall Back:  No CVA tenderness Lungs:  no wheezes although basilar rales are present HEART:  Regular rate rhythm, no murmurs, no rubs, no clicks Abd:  soft, positive bowel sounds, no organomegally, no rebound, no guarding Ext:  2 plus pulses, 2+ peripheral edema, no cyanosis, no clubbing Skin:  No rashes no nodules Neuro:  CN II through XII intact, motor grossly intact  ECG - atrial fib with ventricular pacing  DEVICE  Normal device function.  See PaceArt for details.   Assess/Plan: 1. Recurrent atrial fib - she is on high dose sotalol and we will stop this and ask her to start taking amiodarone 200 bid. I would anticipate DCCV in 4 weeks.  2. Coags - she will likely need less coumadin on amiodarone. I have discussed this with our nurses in the coumadin clinic. Will plan to follow INR's weekly. 3. CHB - she is stable, s/p PPM insertion 4. Chronic diastolic heart failure - her symptoms have worsened. She has developed worsening edema which coincides with her development of atrial fib. I considered adding lasix but will hold off for now.  5. PM - her Medtronic DDD PM is working normally. Will recheck in several months.   Molly Taylor.D.

## 2015-06-12 NOTE — Patient Instructions (Signed)
Medication Instructions:  Your physician has recommended you make the following change in your medication:  1) Stop Sotalol 2) Start Amiodarone 200mg  twice daily   Labwork: None ordered    Testing/Procedures: Your physician has recommended that you have a Cardioversion (DCCV). Electrical Cardioversion uses a jolt of electricity to your heart either through paddles or wired patches attached to your chest. This is a controlled, usually prescheduled, procedure. Defibrillation is done under light anesthesia in the hospital, and you usually go home the day of the procedure. This is done to get your heart back into a normal rhythm. You are not awake for the procedure. Please see the instruction sheet given to you today.    Follow-Up: Your physician recommends that you schedule a follow-up appointment in: 3 weeks for an EKG(kelly to do) set up for 4/12 or 4/14---in still out of rhythm set up for Cardioversion the next week   Any Other Special Instructions Will Be Listed Below (If Applicable).     If you need a refill on your cardiac medications before your next appointment, please call your pharmacy.

## 2015-06-17 ENCOUNTER — Encounter: Payer: Self-pay | Admitting: Internal Medicine

## 2015-06-17 ENCOUNTER — Other Ambulatory Visit: Payer: Self-pay | Admitting: Internal Medicine

## 2015-06-19 ENCOUNTER — Ambulatory Visit (INDEPENDENT_AMBULATORY_CARE_PROVIDER_SITE_OTHER): Payer: Medicare Other | Admitting: Pharmacist

## 2015-06-19 DIAGNOSIS — I480 Paroxysmal atrial fibrillation: Secondary | ICD-10-CM

## 2015-06-19 DIAGNOSIS — I4891 Unspecified atrial fibrillation: Secondary | ICD-10-CM | POA: Diagnosis not present

## 2015-06-19 DIAGNOSIS — Z5181 Encounter for therapeutic drug level monitoring: Secondary | ICD-10-CM | POA: Diagnosis not present

## 2015-06-19 LAB — POCT INR: INR: 2.1

## 2015-06-26 ENCOUNTER — Ambulatory Visit (INDEPENDENT_AMBULATORY_CARE_PROVIDER_SITE_OTHER): Payer: Medicare Other

## 2015-06-26 DIAGNOSIS — I480 Paroxysmal atrial fibrillation: Secondary | ICD-10-CM | POA: Diagnosis not present

## 2015-06-26 DIAGNOSIS — Z5181 Encounter for therapeutic drug level monitoring: Secondary | ICD-10-CM | POA: Diagnosis not present

## 2015-06-26 DIAGNOSIS — I4891 Unspecified atrial fibrillation: Secondary | ICD-10-CM | POA: Diagnosis not present

## 2015-06-26 LAB — POCT INR: INR: 3

## 2015-06-27 DIAGNOSIS — Z87440 Personal history of urinary (tract) infections: Secondary | ICD-10-CM | POA: Diagnosis not present

## 2015-06-27 DIAGNOSIS — Z0000 Encounter for general adult medical examination without abnormal findings: Secondary | ICD-10-CM | POA: Diagnosis not present

## 2015-07-04 ENCOUNTER — Ambulatory Visit (INDEPENDENT_AMBULATORY_CARE_PROVIDER_SITE_OTHER): Payer: Medicare Other | Admitting: *Deleted

## 2015-07-04 ENCOUNTER — Encounter: Payer: Self-pay | Admitting: *Deleted

## 2015-07-04 ENCOUNTER — Ambulatory Visit (INDEPENDENT_AMBULATORY_CARE_PROVIDER_SITE_OTHER): Payer: Medicare Other

## 2015-07-04 VITALS — HR 82 | Ht 65.0 in | Wt 164.0 lb

## 2015-07-04 DIAGNOSIS — I4891 Unspecified atrial fibrillation: Secondary | ICD-10-CM

## 2015-07-04 DIAGNOSIS — Z5181 Encounter for therapeutic drug level monitoring: Secondary | ICD-10-CM | POA: Diagnosis not present

## 2015-07-04 DIAGNOSIS — I480 Paroxysmal atrial fibrillation: Secondary | ICD-10-CM

## 2015-07-04 LAB — POCT INR: INR: 3.6

## 2015-07-04 NOTE — Progress Notes (Signed)
Patient here for an EKG.  She has been therapeutic now for 3 weeks but is hesitant in setting up DCCV.  She would like to discuss with Dr Lovena Le again.  I have added her on to his schedule for Fri at 11am

## 2015-07-07 NOTE — Patient Instructions (Signed)
Medication Instructions:  Your physician recommends that you continue on your current medications as directed. Please refer to the Current Medication list given to you today.   Labwork: None ordered   Testing/Procedures: None ordered   Follow-Up: Your physician recommends that you schedule a follow-up appointment next week   Any Other Special Instructions Will Be Listed Below (If Applicable).     If you need a refill on your cardiac medications before your next appointment, please call your pharmacy.

## 2015-07-11 ENCOUNTER — Encounter: Payer: Self-pay | Admitting: Internal Medicine

## 2015-07-11 ENCOUNTER — Ambulatory Visit (INDEPENDENT_AMBULATORY_CARE_PROVIDER_SITE_OTHER): Payer: Medicare Other | Admitting: Internal Medicine

## 2015-07-11 ENCOUNTER — Ambulatory Visit (INDEPENDENT_AMBULATORY_CARE_PROVIDER_SITE_OTHER): Payer: Medicare Other | Admitting: Pharmacist

## 2015-07-11 VITALS — BP 124/72 | HR 94 | Ht 65.0 in | Wt 159.0 lb

## 2015-07-11 DIAGNOSIS — I4891 Unspecified atrial fibrillation: Secondary | ICD-10-CM

## 2015-07-11 DIAGNOSIS — Z5181 Encounter for therapeutic drug level monitoring: Secondary | ICD-10-CM

## 2015-07-11 DIAGNOSIS — I480 Paroxysmal atrial fibrillation: Secondary | ICD-10-CM

## 2015-07-11 DIAGNOSIS — I495 Sick sinus syndrome: Secondary | ICD-10-CM | POA: Diagnosis not present

## 2015-07-11 LAB — POCT INR: INR: 3.2

## 2015-07-11 MED ORDER — AMIODARONE HCL 200 MG PO TABS: 200.0000 mg | ORAL_TABLET | Freq: Every day | ORAL | Status: DC

## 2015-07-11 NOTE — Progress Notes (Signed)
HPI Molly Taylor returns today for followup. She is a pleasant 82 yo woman with symptomatic bradycardia, s/p PPM generator change out two years ago. Since then, she has been stable.  She denies chest pain or syncope. Her peripheral edema had worsened and she was found to be in atrial fib and I placed her on amiodarone and set her up for a DCCV. In the interim, she has reverted back to NSR. She cannot tell that she has gone back to rhythm. She did not feel palpitations.   Allergies  Allergen Reactions  . Codeine Nausea And Vomiting  . Sulfa Antibiotics Swelling     Current Outpatient Prescriptions  Medication Sig Dispense Refill  . acetaminophen (TYLENOL) 650 MG suppository Place 650 mg rectally daily as needed. For pain    . amiodarone (PACERONE) 200 MG tablet Take 1 tablet (200 mg total) by mouth daily. 90 tablet 3  . benzonatate (TESSALON) 200 MG capsule take 1 capsule by mouth three times a day for cough  0  . COD LIVER OIL PO Take 1 tablet by mouth daily.     Marland Kitchen diltiazem (CARDIZEM CD) 240 MG 24 hr capsule Take 1 capsule by mouth once daily 30 capsule 9  . loratadine (CLARITIN) 10 MG tablet Take 10 mg by mouth daily.     . Melatonin 5 MG TABS Take 1 tablet by mouth at bedtime as needed.    . metoprolol succinate (TOPROL-XL) 25 MG 24 hr tablet Take 1 tablet (25 mg total) by mouth daily. 90 tablet 3  . multivitamin-iron-minerals-folic acid (CENTRUM) chewable tablet Chew 1 tablet by mouth daily.     . nitrofurantoin, macrocrystal-monohydrate, (MACROBID) 100 MG capsule Take 1 capsule by mouth at bedtime.  0  . PREMARIN vaginal cream Apply 1 application topically at bedtime.  0  . pseudoephedrine-guaifenesin (MUCINEX D) 60-600 MG per tablet Take 1 tablet by mouth 2 (two) times daily as needed for congestion.     Marland Kitchen tolterodine (DETROL) 2 MG tablet   1  . warfarin (COUMADIN) 6 MG tablet Take as directed by anticoagulation clinic 30 tablet 3   No current facility-administered medications  for this visit.     Past Medical History  Diagnosis Date  . Allergic rhinitis, cause unspecified   . Hypercholesteremia   . GERD (gastroesophageal reflux disease)   . Hemorrhage of rectum and anus   . Generalized osteoarthrosis, unspecified site   . Hypertonicity of bladder   . HTN (hypertension)   . Chronic anticoagulation   . Pancreatitis, gallstone   . Renal cell cancer ([place])   . A-fib ([place])   . Tachy-brady syndrome ([place])     s/p PPM  . Wears glasses     reading  . Wears partial dentures     upper partial    ROS:   All systems reviewed and negative except as noted in the HPI.   Past Surgical History  Procedure Laterality Date  . Left knee replacement - dr. French Ana  2012    lt total knee  . Total abdominal hysterectomy w/ bilateral salpingoophorectomy    . Pacemaker generator change  04/2013    MDT ADDRL1 pacemaker implanted by Dr Lovena Le  . Laparoscopic cholecystectomy  2007    lap choli  . Tubal ligation    . Thumb arthroscopy  2007    right  . Pacemaker insertion  2007  . Vein ligation    . Proximal interphalangeal fusion (pip) Right 08/22/2013  Procedure: EXCISION MUCOID CYST DEBRIDEMENT PROXIMAL INTERPHALANGEAL JOINT;  Surgeon: Wynonia Sours, MD;  Location: [facility];  Service: Orthopedics;  Laterality: Right;  . Permanent pacemaker generator change N/A 05/03/2013    Procedure: PERMANENT PACEMAKER GENERATOR CHANGE;  Surgeon: Evans Lance, MD;  Location: [facility] CATH LAB;  Service: Cardiovascular;  Laterality: N/A;     No family history on file.   Social History   Social History  . Marital Status: Married    Spouse Name: N/A  . Number of Children: N/A  . Years of Education: N/A   Occupational History  . Not on file.   Social History Main Topics  . Smoking status: Never Smoker   . Smokeless tobacco: Not on file  . Alcohol Use: No  . Drug Use: No  . Sexual Activity: Not on file   Other Topics Concern  . Not on file   Social  History Narrative     BP 124/72 mmHg  Pulse 94  Ht 5\' 5"  (1.651 m)  Wt 159 lb (72.122 kg)  BMI 26.46 kg/m2  SpO2 97%  Physical Exam:  stable appearing elderly woman, looking younger than her stated age, NAD HEENT: Unremarkable Neck:   7 cm JVD, no thyromegall Back:  No CVA tenderness Lungs:  no wheezes although basilar rales are present HEART:  Regular rate rhythm, no murmurs, no rubs, no clicks Abd:  soft, positive bowel sounds, no organomegally, no rebound, no guarding Ext:  2 plus pulses, 1+ peripheral edema, no cyanosis, no clubbing Skin:  No rashes no nodules Neuro:  CN II through XII intact, motor grossly intact  ECG - nsr with ventricular pacing  DEVICE  Normal device function.  See PaceArt for details. Underlying rhythm is NSR  Assess/Plan: 1. Recurrent atrial fib - she has returned to NSR on amio. She will continue this medication and reduce from 400 a day to 200 a day in 2 weeks.  2. Coags - she will likely need less coumadin on amiodarone. I have discussed this with our nurses in the coumadin clinic. Will plan to follow INR's weekly. 3. CHB - she is stable, s/p PPM insertion 4. Chronic diastolic heart failure - her symptoms have stabilized. Will add lasix as needed. 5. PM - her Medtronic DDD PM is working normally. Will recheck in several months.   Molly Taylor,M.D. Mikle Bosworth.D.

## 2015-07-11 NOTE — Patient Instructions (Signed)
Medication Instructions:  Your physician has recommended you make the following change in your medication:  1) Decrease Amiodarone to 200 mg daily in 2 weeks    Labwork: None ordered   Testing/Procedures: None ordered   Follow-Up: Your physician recommends that you schedule a follow-up appointment in: 3 months with Dr Lovena Le   Any Other Special Instructions Will Be Listed Below (If Applicable).     If you need a refill on your cardiac medications before your next appointment, please call your pharmacy.

## 2015-07-14 ENCOUNTER — Other Ambulatory Visit: Payer: Self-pay | Admitting: Cardiology

## 2015-07-17 LAB — CUP PACEART INCLINIC DEVICE CHECK
Battery Impedance: 136 Ohm
Battery Remaining Longevity: 123 mo
Battery Voltage: 2.79 V
Brady Statistic AP VP Percent: 71 %
Brady Statistic AP VS Percent: 0 %
Brady Statistic AS VP Percent: 29 %
Date Time Interrogation Session: 20170421190457
Implantable Lead Implant Date: 20070313
Implantable Lead Location: 753860
Implantable Lead Model: 5076
Implantable Lead Model: 5092
Lead Channel Impedance Value: 655 Ohm
Lead Channel Pacing Threshold Amplitude: 0 V
Lead Channel Pacing Threshold Amplitude: 0.5 V
Lead Channel Pacing Threshold Pulse Width: 0.4 ms
Lead Channel Pacing Threshold Pulse Width: 0.4 ms
Lead Channel Pacing Threshold Pulse Width: 0.4 ms
Lead Channel Sensing Intrinsic Amplitude: 2 mV
Lead Channel Setting Sensing Sensitivity: 5.6 mV
MDC IDC LEAD IMPLANT DT: 20070313
MDC IDC LEAD LOCATION: 753859
MDC IDC MSMT LEADCHNL RA IMPEDANCE VALUE: 500 Ohm
MDC IDC MSMT LEADCHNL RV PACING THRESHOLD AMPLITUDE: 0.75 V
MDC IDC MSMT LEADCHNL RV PACING THRESHOLD AMPLITUDE: 0.75 V
MDC IDC MSMT LEADCHNL RV PACING THRESHOLD PULSEWIDTH: 0.4 ms
MDC IDC MSMT LEADCHNL RV SENSING INTR AMPL: 22.4 mV
MDC IDC SET LEADCHNL RA PACING AMPLITUDE: 2 V
MDC IDC SET LEADCHNL RV PACING AMPLITUDE: 2.5 V
MDC IDC SET LEADCHNL RV PACING PULSEWIDTH: 0.4 ms
MDC IDC STAT BRADY AS VS PERCENT: 0 %

## 2015-07-18 DIAGNOSIS — L603 Nail dystrophy: Secondary | ICD-10-CM | POA: Diagnosis not present

## 2015-07-18 DIAGNOSIS — I739 Peripheral vascular disease, unspecified: Secondary | ICD-10-CM | POA: Diagnosis not present

## 2015-07-24 ENCOUNTER — Ambulatory Visit (INDEPENDENT_AMBULATORY_CARE_PROVIDER_SITE_OTHER): Payer: Medicare Other

## 2015-07-24 DIAGNOSIS — I4891 Unspecified atrial fibrillation: Secondary | ICD-10-CM | POA: Diagnosis not present

## 2015-07-24 DIAGNOSIS — Z5181 Encounter for therapeutic drug level monitoring: Secondary | ICD-10-CM

## 2015-07-24 LAB — POCT INR: INR: 3.3

## 2015-08-01 DIAGNOSIS — Z0000 Encounter for general adult medical examination without abnormal findings: Secondary | ICD-10-CM | POA: Diagnosis not present

## 2015-08-01 DIAGNOSIS — Z87440 Personal history of urinary (tract) infections: Secondary | ICD-10-CM | POA: Diagnosis not present

## 2015-08-07 ENCOUNTER — Telehealth: Payer: Self-pay

## 2015-08-07 DIAGNOSIS — M1711 Unilateral primary osteoarthritis, right knee: Secondary | ICD-10-CM | POA: Diagnosis not present

## 2015-08-07 NOTE — Telephone Encounter (Signed)
Cardiac Clearance and Coumadin instructions needed for patient to have right total knee replacement.  Surgery date is pending clearance and Coumadin recommendations for possible bridging.  To be faxed to:  [facility]  [phone] Attn: Claiborne Billings

## 2015-08-07 NOTE — Telephone Encounter (Signed)
Pt has a CHADS score of 2 for HTN and age.  Per protocol, okay to hold Coumadin with no Lovenox bridge required.  Will send to Dr. Radford Pax for medical clearance.

## 2015-08-07 NOTE — Telephone Encounter (Signed)
Pt on Coumadin for afib with CHADS2 score of 2 (age, HTN). Ok to hold Coumadin x5 days prior to procedure. Will have Dr. Radford Pax address cardiac clearance.

## 2015-08-08 ENCOUNTER — Ambulatory Visit (INDEPENDENT_AMBULATORY_CARE_PROVIDER_SITE_OTHER): Payer: Medicare Other | Admitting: *Deleted

## 2015-08-08 DIAGNOSIS — I4891 Unspecified atrial fibrillation: Secondary | ICD-10-CM

## 2015-08-08 DIAGNOSIS — Z5181 Encounter for therapeutic drug level monitoring: Secondary | ICD-10-CM

## 2015-08-08 LAB — POCT INR: INR: 1.8

## 2015-08-10 NOTE — Telephone Encounter (Signed)
Patient is low risk from a cardiac standpoint for surgery

## 2015-08-11 NOTE — Telephone Encounter (Signed)
THIS NOTE  WAS FAXED  [place]

## 2015-08-22 ENCOUNTER — Ambulatory Visit (INDEPENDENT_AMBULATORY_CARE_PROVIDER_SITE_OTHER): Payer: Medicare Other | Admitting: *Deleted

## 2015-08-22 DIAGNOSIS — Z5181 Encounter for therapeutic drug level monitoring: Secondary | ICD-10-CM | POA: Diagnosis not present

## 2015-08-22 DIAGNOSIS — I4891 Unspecified atrial fibrillation: Secondary | ICD-10-CM | POA: Diagnosis not present

## 2015-08-22 LAB — POCT INR: INR: 2.6

## 2015-09-01 ENCOUNTER — Telehealth: Payer: Self-pay | Admitting: Internal Medicine

## 2015-09-01 NOTE — Telephone Encounter (Signed)
New message      How long should pt stay on amiodarone?

## 2015-09-01 NOTE — Telephone Encounter (Addendum)
Returned call to patient and let her know to stay on the medication until her she has her follow up on 09/25/15 with Dr Lovena Le at 12:15.  She can discuss with him then

## 2015-09-09 DIAGNOSIS — M1711 Unilateral primary osteoarthritis, right knee: Secondary | ICD-10-CM | POA: Diagnosis not present

## 2015-09-12 ENCOUNTER — Ambulatory Visit (INDEPENDENT_AMBULATORY_CARE_PROVIDER_SITE_OTHER): Payer: Medicare Other | Admitting: *Deleted

## 2015-09-12 DIAGNOSIS — I4891 Unspecified atrial fibrillation: Secondary | ICD-10-CM | POA: Diagnosis not present

## 2015-09-12 DIAGNOSIS — Z5181 Encounter for therapeutic drug level monitoring: Secondary | ICD-10-CM | POA: Diagnosis not present

## 2015-09-12 LAB — POCT INR: INR: 2.3

## 2015-09-16 ENCOUNTER — Ambulatory Visit: Payer: Self-pay | Admitting: Physician Assistant

## 2015-09-16 NOTE — H&P (Signed)
TOTAL KNEE ADMISSION H&P  Patient is being admitted for right total knee arthroplasty.  Subjective:  Chief Complaint:right knee pain.  HPI: Molly Taylor, 82 y.o. female, has a history of pain and functional disability in the right knee due to arthritis and has failed non-surgical conservative treatments for greater than 12 weeks to includecorticosteriod injections, viscosupplementation injections, use of assistive devices and activity modification.  Onset of symptoms was gradual, starting >10 years ago with gradually worsening course since that time. The patient noted no past surgery on the right knee(s).  Patient currently rates pain in the right knee(s) at 10 out of 10 with activity. Patient has night pain, worsening of pain with activity and weight bearing, pain that interferes with activities of daily living, pain with passive range of motion, crepitus and joint swelling.  Patient has evidence of periarticular osteophytes and joint space narrowing by imaging studies. There is no active infection.  Patient Active Problem List   Diagnosis Date Noted  . Pacemaker 04/24/2013  . SOB (shortness of breath) 04/16/2013  . Encounter for therapeutic drug monitoring 04/12/2013  . Tachy-brady syndrome ([place]) 02/02/2013  . HTN (hypertension)   . Chronic anticoagulation   . Pancreatitis, gallstone   . Atrial fibrillation ([place]) 01/18/2013   Past Medical History  Diagnosis Date  . Allergic rhinitis, cause unspecified   . Hypercholesteremia   . GERD (gastroesophageal reflux disease)   . Hemorrhage of rectum and anus   . Generalized osteoarthrosis, unspecified site   . Hypertonicity of bladder   . HTN (hypertension)   . Chronic anticoagulation   . Pancreatitis, gallstone   . Renal cell cancer ([place])   . A-fib ([place])   . Tachy-brady syndrome ([place])     s/p PPM  . Wears glasses     reading  . Wears partial dentures     upper partial    Past Surgical History  Procedure Laterality Date  .  Left knee replacement - dr. French Ana  2012    lt total knee  . Total abdominal hysterectomy w/ bilateral salpingoophorectomy    . Pacemaker generator change  04/2013    MDT ADDRL1 pacemaker implanted by Dr Lovena Le  . Laparoscopic cholecystectomy  2007    lap choli  . Tubal ligation    . Thumb arthroscopy  2007    right  . Pacemaker insertion  2007  . Vein ligation    . Proximal interphalangeal fusion (pip) Right 08/22/2013    Procedure: EXCISION MUCOID CYST DEBRIDEMENT PROXIMAL INTERPHALANGEAL JOINT;  Surgeon: Wynonia Sours, MD;  Location: [place];  Service: Orthopedics;  Laterality: Right;  . Permanent pacemaker generator change N/A 05/03/2013    Procedure: PERMANENT PACEMAKER GENERATOR CHANGE;  Surgeon: Evans Lance, MD;  Location: [facility] CATH LAB;  Service: Cardiovascular;  Laterality: N/A;     (Not in a hospital admission) Allergies  Allergen Reactions  . Codeine Nausea And Vomiting  . Sulfa Antibiotics Swelling    Social History  Substance Use Topics  . Smoking status: Never Smoker   . Smokeless tobacco: Not on file  . Alcohol Use: No    No family history on file.   Review of Systems  HENT: Positive for tinnitus.   Cardiovascular: Positive for leg swelling.  Gastrointestinal: Positive for constipation.  Genitourinary: Positive for frequency.  Musculoskeletal: Positive for joint pain.  Endo/Heme/Allergies: Bruises/bleeds easily.  All other systems reviewed and are negative.   Objective:  Physical Exam  Constitutional: She is oriented to  person, place, and time. She appears well-developed and well-nourished. No distress.  HENT:  Head: Normocephalic and atraumatic.  Nose: Nose normal.  Eyes: Conjunctivae and EOM are normal. Pupils are equal, round, and reactive to light.  Neck: Normal range of motion. Neck supple.  Cardiovascular: Normal rate, regular rhythm, normal heart sounds and intact distal pulses.   Respiratory: Effort normal and breath sounds  normal. No respiratory distress.  GI: Soft. Bowel sounds are normal. She exhibits no distension. There is no tenderness.  Musculoskeletal:       Right knee: She exhibits swelling. She exhibits normal range of motion, no deformity and no erythema. Tenderness found.  Neurological: She is alert and oriented to person, place, and time.  Skin: Skin is warm and dry. No rash noted. No erythema.  Psychiatric: She has a normal mood and affect. Her behavior is normal.    Vital signs in last 24 hours: @VSRANGES @  Labs:   Estimated body mass index is 26.46 kg/(m^2) as calculated from the following:   Height as of 07/11/15: 5\' 5"  (1.651 m).   Weight as of 07/11/15: 72.122 kg (159 lb).   Imaging Review Plain radiographs demonstrate severe degenerative joint disease of the right knee(s). The overall alignment isneutral. The bone quality appears to be fair for age and reported activity level.  Assessment/Plan:  End stage arthritis, right knee   The patient history, physical examination, clinical judgment of the provider and imaging studies are consistent with end stage degenerative joint disease of the right knee(s) and total knee arthroplasty is deemed medically necessary. The treatment options including medical management, injection therapy arthroscopy and arthroplasty were discussed at length. The risks and benefits of total knee arthroplasty were presented and reviewed. The risks due to aseptic loosening, infection, stiffness, patella tracking problems, thromboembolic complications and other imponderables were discussed. The patient acknowledged the explanation, agreed to proceed with the plan and consent was signed. Patient is being admitted for inpatient treatment for surgery, pain control, PT, OT, prophylactic antibiotics, VTE prophylaxis, progressive ambulation and ADL's and discharge planning. The patient is planning to be discharged to skilled nursing facility

## 2015-09-22 ENCOUNTER — Encounter (HOSPITAL_COMMUNITY): Payer: Self-pay

## 2015-09-22 ENCOUNTER — Encounter (HOSPITAL_COMMUNITY)
Admission: RE | Admit: 2015-09-22 | Discharge: 2015-09-22 | Disposition: A | Discharge status: Home / Self Care | Payer: Medicare Other | Source: Ambulatory Visit | Attending: Orthopedic Surgery | Admitting: Orthopedic Surgery

## 2015-09-22 ENCOUNTER — Ambulatory Visit (HOSPITAL_COMMUNITY)
Admission: RE | Admit: 2015-09-22 | Discharge: 2015-09-22 | Disposition: A | Discharge status: Home / Self Care | Payer: Medicare Other | Source: Ambulatory Visit | Attending: Physician Assistant | Admitting: Physician Assistant

## 2015-09-22 DIAGNOSIS — Z01818 Encounter for other preprocedural examination: Secondary | ICD-10-CM | POA: Diagnosis not present

## 2015-09-22 DIAGNOSIS — M1711 Unilateral primary osteoarthritis, right knee: Secondary | ICD-10-CM

## 2015-09-22 DIAGNOSIS — Z01812 Encounter for preprocedural laboratory examination: Secondary | ICD-10-CM | POA: Diagnosis not present

## 2015-09-22 HISTORY — DX: Other specified postprocedural states: Z98.890

## 2015-09-22 HISTORY — DX: Family history of other specified conditions: Z84.89

## 2015-09-22 HISTORY — DX: Unspecified urinary incontinence: R32

## 2015-09-22 HISTORY — DX: Nausea with vomiting, unspecified: R11.2

## 2015-09-22 HISTORY — DX: Personal history of other diseases of the respiratory system: Z87.09

## 2015-09-22 LAB — COMPREHENSIVE METABOLIC PANEL
ALBUMIN: 3.5 g/dL (ref 3.5–5.0)
ALT: 12 U/L — AB (ref 14–54)
AST: 17 U/L (ref 15–41)
Alkaline Phosphatase: 59 U/L (ref 38–126)
Anion gap: 4 — ABNORMAL LOW (ref 5–15)
BUN: 18 mg/dL (ref 6–20)
CHLORIDE: 110 mmol/L (ref 101–111)
CO2: 27 mmol/L (ref 22–32)
CREATININE: 0.94 mg/dL (ref 0.44–1.00)
Calcium: 9.7 mg/dL (ref 8.9–10.3)
GFR calc Af Amer: >60 mL/min (ref >60)
GFR calc non Af Amer: 53 mL/min — ABNORMAL LOW (ref >60)
GLUCOSE: 71 mg/dL (ref 65–99)
POTASSIUM: 4.1 mmol/L (ref 3.5–5.1)
SODIUM: 141 mmol/L (ref 135–145)
Total Bilirubin: 0.9 mg/dL (ref 0.3–1.2)
Total Protein: 6.4 g/dL — ABNORMAL LOW (ref 6.5–8.1)

## 2015-09-22 LAB — URINALYSIS, ROUTINE W REFLEX MICROSCOPIC
Bilirubin Urine: NEGATIVE
Glucose, UA: NEGATIVE mg/dL
Ketones, ur: NEGATIVE mg/dL
NITRITE: NEGATIVE
PROTEIN: NEGATIVE mg/dL
SPECIFIC GRAVITY, URINE: 1.018 (ref 1.005–1.030)
pH: 6 (ref 5.0–8.0)

## 2015-09-22 LAB — CBC WITH DIFFERENTIAL/PLATELET
BASOS ABS: 0 10*3/uL (ref 0.0–0.1)
BASOS PCT: 0 %
EOS PCT: 2 %
Eosinophils Absolute: 0.1 10*3/uL (ref 0.0–0.7)
HCT: 42.7 % (ref 36.0–46.0)
Hemoglobin: 14 g/dL (ref 12.0–15.0)
LYMPHS PCT: 25 %
Lymphs Abs: 1.5 10*3/uL (ref 0.7–4.0)
MCH: 31.3 pg (ref 26.0–34.0)
MCHC: 32.8 g/dL (ref 30.0–36.0)
MCV: 95.5 fL (ref 78.0–100.0)
MONO ABS: 0.5 10*3/uL (ref 0.1–1.0)
Monocytes Relative: 8 %
NEUTROS ABS: 3.7 10*3/uL (ref 1.7–7.7)
Neutrophils Relative %: 65 %
PLATELETS: 125 10*3/uL — AB (ref 150–400)
RBC: 4.47 MIL/uL (ref 3.87–5.11)
RDW: 15 % (ref 11.5–15.5)
WBC: 5.8 10*3/uL (ref 4.0–10.5)

## 2015-09-22 LAB — URINE MICROSCOPIC-ADD ON

## 2015-09-22 LAB — SURGICAL PCR SCREEN
MRSA, PCR: POSITIVE — AB
STAPHYLOCOCCUS AUREUS: POSITIVE — AB

## 2015-09-22 LAB — TYPE AND SCREEN
ABO/RH(D): A POS
Antibody Screen: NEGATIVE

## 2015-09-22 NOTE — Progress Notes (Signed)
Patient notified of positive PCR and verbalized understanding.  Prescription called in to [facility] on [address].

## 2015-09-22 NOTE — Progress Notes (Signed)
PCP - Dr. Donnie Coffin Cardiologist - Dr. Fransico Him     -clearance note - 08/10/15  EKG - 07/04/15 CXR - 09/22/15  Echo - 2015 Stress test/Cardiac Cath - denies  Patient denies chest pain and shortness of breath at PAT appointment.    Patient states that her last dose of Coumadin will be on Saturday, 09/27/15.   PT-INR and PTT will need to be collected DOS.

## 2015-09-22 NOTE — Pre-Procedure Instructions (Addendum)
Molly Taylor  09/22/2015      [id number] [address], [place] [address] Phone: [phone] Fax: [phone]  [facility] [address] [place], [place] [address] Phone: [phone] Fax: [phone]    Your procedure is scheduled on Friday, July 14th, 2017.  Report to [facility] Admitting at 8:30 A.M.   Call this number if you have problems the morning of surgery:  [phone]   Remember:  Do not eat food or drink liquids after midnight.   Take these medicines the morning of surgery with A SIP OF WATER: Acetaminophen (Tylenol) if needed, Amiodarone (Pacerone), Diltiazem (Cardizem), Loratadine (Claritin), Metoprolol Succinate (Toprol-XL).  Per your physician's instructions, stop Coumadin 5 days prior to surgery.  (Last dose on Saturday, 09/27/15).  Stop taking Aspirin, NSAIDs, Aleve, Naproxen, Ibuprofen, Advil, Motrin, BC's, Goody's, Mucinex-D,  Fish oil, all herbal medications, and all vitamins (Cod liver oil, Multivitamin)   Do not wear jewelry, make-up or nail polish.  Do not wear lotions, powders, or perfumes.    Do not shave 48 hours prior to surgery.     Do not bring valuables to the hospital.  [facility] is not responsible for any belongings or valuables.  Contacts, dentures or bridgework may not be worn into surgery.  Leave your suitcase in the car.  After surgery it may be brought to your room.  For patients admitted to the hospital, discharge time will be determined by your treatment team.  Patients discharged the day of surgery will not be allowed to drive home.   Special instructions:  Preparing for Surgery  Please read over the following fact sheets that you were given. Total Joint Packet and MRSA Information and Incentive Spirometry    [facility]- Preparing For Surgery  Before surgery, you can play an important  role. Because skin is not sterile, your skin needs to be as free of germs as possible. You can reduce the number of germs on your skin by washing with CHG (chlorahexidine gluconate) Soap before surgery.  CHG is an antiseptic cleaner which kills germs and bonds with the skin to continue killing germs even after washing.  Please do not use if you have an allergy to CHG or antibacterial soaps. If your skin becomes reddened/irritated stop using the CHG.  Do not shave (including legs and underarms) for at least 48 hours prior to first CHG shower. It is OK to shave your face.  Please follow these instructions carefully.   1. Shower the NIGHT BEFORE SURGERY and the MORNING OF SURGERY with CHG.   2. If you chose to wash your hair, wash your hair first as usual with your normal shampoo.  3. After you shampoo, rinse your hair and body thoroughly to remove the shampoo.  4. Use CHG as you would any other liquid soap. You can apply CHG directly to the skin and wash gently with a scrungie or a clean washcloth.   5. Apply the CHG Soap to your body ONLY FROM THE NECK DOWN.  Do not use on open wounds or open sores. Avoid contact with your eyes, ears, mouth and genitals (private parts). Wash genitals (private parts) with your normal soap.  6. Wash thoroughly, paying special attention to the area where your surgery will be performed.  7. Thoroughly rinse your body with warm water from the neck down.  8. DO NOT shower/wash with  your normal soap after using and rinsing off the CHG Soap.  9. Pat yourself dry with a CLEAN TOWEL.   10. Wear CLEAN PAJAMAS   11. Place CLEAN SHEETS on your bed the night of your first shower and DO NOT SLEEP WITH PETS.  Day of Surgery: Do not apply any deodorants/lotions. Please wear clean clothes to the hospital/surgery center.

## 2015-09-23 LAB — URINE CULTURE

## 2015-09-24 NOTE — Progress Notes (Addendum)
Anesthesia Chart Review:  Pt is is an 82 year old female scheduled for R total knee arthroplasty on 10/03/2015 with Earlie Server, MD.   Cardiologist is Fransico Him, MD who cleared pt for surgery. EP cardiologist is Cristopher Peru, MD who is aware of upcoming surgery.   PMH includes:  Atrial fibrillation, tachy-brady syndrome, pacemaker, HTN, hyperlipidemia, GERD.  Never smoker. BMI 27  Medications include: amiodarone, diltiazem, metoprolol, coumadin. Last dose coumadin 09/27/15.   Preoperative labs reviewed.  PT/PTT will be obtained DOS.   Chest x-ray 09/22/15 reviewed. No active cardiopulmonary disease  EKG 07/04/15: electronic ventricular pacemaker  Echo 04/25/13:  - Left ventricle: The cavity size was normal. Systolic function was normal. The estimated ejection fraction was in the range of 55% to 60%. The study is not technically sufficient to allow evaluation of LV diastolic function. - Aortic valve: Mild regurgitation. - Aorta: Aortic root dimension: 28mm (ED). - Left atrium: The atrium was mildly dilated.  I reached out to Dr. Radford Pax about aortic root dilatation last checked on echo in 2015 as above. Pt ok to proceed with surgery without updated echo.   Dr. Tanna Furry notes indicate pt will need a magnet placed over pacemaker during surgery and that device will not need interrogation after magnet is removed.    If no changes, I anticipate pt can proceed with surgery as scheduled.   Willeen Cass, FNP-BC [facility] Short Stay Surgical Center/Anesthesiology Phone: [phone] 09/24/2015 3:43 PM

## 2015-09-25 ENCOUNTER — Ambulatory Visit (INDEPENDENT_AMBULATORY_CARE_PROVIDER_SITE_OTHER): Payer: Medicare Other | Admitting: *Deleted

## 2015-09-25 ENCOUNTER — Encounter: Payer: Self-pay | Admitting: Internal Medicine

## 2015-09-25 ENCOUNTER — Ambulatory Visit (INDEPENDENT_AMBULATORY_CARE_PROVIDER_SITE_OTHER): Payer: Medicare Other | Admitting: Internal Medicine

## 2015-09-25 ENCOUNTER — Telehealth: Payer: Self-pay

## 2015-09-25 ENCOUNTER — Encounter (INDEPENDENT_AMBULATORY_CARE_PROVIDER_SITE_OTHER): Payer: Self-pay

## 2015-09-25 VITALS — BP 108/68 | HR 90 | Ht 66.0 in | Wt 161.2 lb

## 2015-09-25 DIAGNOSIS — I480 Paroxysmal atrial fibrillation: Secondary | ICD-10-CM | POA: Diagnosis not present

## 2015-09-25 DIAGNOSIS — Z01818 Encounter for other preprocedural examination: Secondary | ICD-10-CM

## 2015-09-25 DIAGNOSIS — I4891 Unspecified atrial fibrillation: Secondary | ICD-10-CM | POA: Diagnosis not present

## 2015-09-25 DIAGNOSIS — I77810 Thoracic aortic ectasia: Secondary | ICD-10-CM

## 2015-09-25 DIAGNOSIS — Z5181 Encounter for therapeutic drug level monitoring: Secondary | ICD-10-CM

## 2015-09-25 LAB — CUP PACEART INCLINIC DEVICE CHECK
Battery Remaining Longevity: 109 mo
Brady Statistic AP VP Percent: 96 %
Brady Statistic AS VS Percent: 0 %
Date Time Interrogation Session: 20170706125520
Implantable Lead Implant Date: 20070313
Implantable Lead Model: 5076
Implantable Lead Model: 5092
Lead Channel Pacing Threshold Amplitude: 0.875 V
Lead Channel Pacing Threshold Pulse Width: 0.4 ms
Lead Channel Pacing Threshold Pulse Width: 0.4 ms
Lead Channel Setting Pacing Amplitude: 2.5 V
Lead Channel Setting Pacing Pulse Width: 0.4 ms
Lead Channel Setting Sensing Sensitivity: 5.6 mV
MDC IDC LEAD IMPLANT DT: 20070313
MDC IDC LEAD LOCATION: 753859
MDC IDC LEAD LOCATION: 753860
MDC IDC MSMT BATTERY IMPEDANCE: 184 Ohm
MDC IDC MSMT BATTERY VOLTAGE: 2.79 V
MDC IDC MSMT LEADCHNL RA IMPEDANCE VALUE: 499 Ohm
MDC IDC MSMT LEADCHNL RA PACING THRESHOLD AMPLITUDE: 0 V
MDC IDC MSMT LEADCHNL RA PACING THRESHOLD AMPLITUDE: 0.5 V
MDC IDC MSMT LEADCHNL RA PACING THRESHOLD PULSEWIDTH: 0.4 ms
MDC IDC MSMT LEADCHNL RV IMPEDANCE VALUE: 638 Ohm
MDC IDC SET LEADCHNL RA PACING AMPLITUDE: 2 V
MDC IDC STAT BRADY AP VS PERCENT: 0 %
MDC IDC STAT BRADY AS VP PERCENT: 4 %

## 2015-09-25 LAB — POCT INR: INR: 2.4

## 2015-09-25 NOTE — Patient Instructions (Addendum)
Your physician recommends that you continue on your current medications as directed. Please refer to the Current Medication list given to you today.  Plan to NOT RESTART WARFARIN and start Eliquis after your knee surgery.  Remote monitoring is used to monitor your Pacemaker of ICD from home. This monitoring reduces the number of office visits required to check your device to one time per year. It allows [place] to keep an eye on the functioning of your device to ensure it is working properly. You are scheduled for a device check from home on 12/28/15. You may send your transmission at any time that day. If you have a wireless device, the transmission will be sent automatically. After your physician reviews your transmission, you will receive a postcard with your next transmission date.  Your physician wants you to follow-up in: 1 year with Dr. Lovena Le.  You will receive a reminder letter in the mail two months in advance. If you don't receive a letter, please call our office to schedule the follow-up appointment.

## 2015-09-25 NOTE — Progress Notes (Signed)
HPI Mrs. Hulbert returns today for followup. She is a pleasant 82 yo woman with symptomatic bradycardia, s/p PPM generator change out two years ago. Since then, she has been stable.  She denies chest pain or syncope. Her peripheral edema had worsened and she was found to be in atrial fib and I placed her on amiodarone and set her up for a DCCV. In the interim, she reverted back to NSR. She cannot tell that she has gone back to rhythm. She did not feel palpitations.  She is pending knee replacement surgery.  Allergies  Allergen Reactions  . Codeine Nausea And Vomiting  . Sulfa Antibiotics Swelling     Current Outpatient Prescriptions  Medication Sig Dispense Refill  . acetaminophen (TYLENOL) 650 MG CR tablet Take 650 mg by mouth every 8 (eight) hours as needed for pain.    Marland Kitchen amiodarone (PACERONE) 200 MG tablet Take 1 tablet (200 mg total) by mouth daily. 90 tablet 3  . COD LIVER OIL PO Take 1 tablet by mouth daily.     Marland Kitchen conjugated estrogens (PREMARIN) vaginal cream Place 1 Applicatorful vaginally 2 (two) times a week.    . diltiazem (CARDIZEM CD) 240 MG 24 hr capsule Take 1 capsule by mouth once daily 30 capsule 9  . loratadine (CLARITIN) 10 MG tablet Take 10 mg by mouth daily as needed for allergies.     . metoprolol succinate (TOPROL-XL) 25 MG 24 hr tablet Take 1 tablet (25 mg total) by mouth daily. 90 tablet 3  . multivitamin-iron-minerals-folic acid (CENTRUM) chewable tablet Chew 1 tablet by mouth daily.     . mupirocin nasal ointment (BACTROBAN) 2 % Place 1 application into the nose as directed. Use one-half of tube in each nostril twice daily for five (5) days. After application, press sides of nose together and gently massage.    . pseudoephedrine-guaifenesin (MUCINEX D) 60-600 MG per tablet Take 1 tablet by mouth 2 (two) times daily as needed for congestion.     Marland Kitchen warfarin (COUMADIN) 6 MG tablet Take 3-6 mg by mouth See admin instructions. 3 mg Sat, Sun Mon Tu Wed Th. 6 mg on  Fridays     No current facility-administered medications for this visit.     Past Medical History  Diagnosis Date  . Allergic rhinitis, cause unspecified   . Hypercholesteremia     pt. denies  . Hemorrhage of rectum and anus   . Generalized osteoarthrosis, unspecified site   . Hypertonicity of bladder   . Chronic anticoagulation   . Pancreatitis, gallstone   . A-fib ([place])   . Tachy-brady syndrome ([place])     s/p PPM  . Wears glasses     reading  . Wears partial dentures     upper partial  . PONV (postoperative nausea and vomiting)   . Family history of adverse reaction to anesthesia     all children - PONV  . HTN (hypertension)   . Urinary incontinence   . GERD (gastroesophageal reflux disease)     pt. denies  . Presence of permanent cardiac pacemaker   . History of bronchitis   . Renal cell cancer ([place])     pt. denies    ROS:   All systems reviewed and negative except as noted in the HPI.   Past Surgical History  Procedure Laterality Date  . Left knee replacement - dr. French Ana  2012    lt total knee  . Total abdominal hysterectomy w/ bilateral  salpingoophorectomy    . Pacemaker generator change  04/2013    MDT ADDRL1 pacemaker implanted by Dr Lovena Le  . Laparoscopic cholecystectomy  2007    lap choli  . Tubal ligation    . Thumb arthroscopy  2007    right  . Pacemaker insertion  2007  . Vein ligation    . Proximal interphalangeal fusion (pip) Right 08/22/2013    Procedure: EXCISION MUCOID CYST DEBRIDEMENT PROXIMAL INTERPHALANGEAL JOINT;  Surgeon: Wynonia Sours, MD;  Location: [place];  Service: Orthopedics;  Laterality: Right;  . Permanent pacemaker generator change N/A 05/03/2013    Procedure: PERMANENT PACEMAKER GENERATOR CHANGE;  Surgeon: Evans Lance, MD;  Location: [facility] CATH LAB;  Service: Cardiovascular;  Laterality: N/A;  . Abdominal hysterectomy    . Colonoscopy       No family history on file.   Social History   Social History    . Marital Status: Married    Spouse Name: N/A  . Number of Children: N/A  . Years of Education: N/A   Occupational History  . Not on file.   Social History Main Topics  . Smoking status: Never Smoker   . Smokeless tobacco: Not on file  . Alcohol Use: No  . Drug Use: No  . Sexual Activity: Not on file   Other Topics Concern  . Not on file   Social History Narrative     BP 108/68 mmHg  Pulse 90  Ht 5\' 6"  (1.676 m)  Wt 161 lb 3.2 oz (73.12 kg)  BMI 26.03 kg/m2  Physical Exam:  stable appearing elderly woman, looking younger than her stated age, NAD HEENT: Unremarkable Neck:   7 cm JVD, no thyromegall Back:  No CVA tenderness Lungs:  no wheezes although basilar rales are present HEART:  Regular rate rhythm, no murmurs, no rubs, no clicks Abd:  soft, positive bowel sounds, no organomegally, no rebound, no guarding Ext:  2 plus pulses, 1+ peripheral edema, no cyanosis, no clubbing Skin:  No rashes no nodules Neuro:  CN II through XII intact, motor grossly intact   DEVICE  Normal device function.  See PaceArt for details. Underlying rhythm is NSR  Assess/Plan: 1. Recurrent atrial fib - she has returned to NSR on amio. She will continue this medication at 200 mg daily.  2. Coags - I would suggest she stop warfarin 5 days prior to knee surgery and would suggest starting Eliquis 5 mg twice daily at the discretion of Dr. French Ana 3. CHB - she is stable, s/p PPM insertion. She will need a magnet applied at the time of her knee surgery. The magnet can be removed after her surgery without additional PPM interogation. 4. Chronic diastolic heart failure - her symptoms have stablized. She is not on lasix and appears to be euvolemic 5. PM - her Medtronic DDD PM is working normally. Will recheck in several months.   Mikle Bosworth.D.

## 2015-09-25 NOTE — Patient Instructions (Signed)
09/27/15-take last dose of Coumadin for procedure

## 2015-09-25 NOTE — Telephone Encounter (Signed)
Sueanne Margarita, MD   SentEmmie Niemann September 24, 2015 9:36 PM    To: Carvel Getting, NP    Cc: Theodoro Parma, RN        Message     OK to proceed with surgery but needs 2D echo repeated. Valetta Fuller please schedule echo        Traci    ----- Message -----     From: Carvel Getting, NP     Sent: 09/24/2015  3:43 PM      To: Sueanne Margarita, MD        Dear Dr. Radford Pax,         I am a NP who reviews charts for anesthesiology. Your pt, Molly Taylor, is scheduled for a knee replacement 10/03/15 with Dr. French Ana. Her echo from 2014 shows a mildly dilated aortic root, and your comment on the report indicates pt needed a repeat echo 1 year later which did not happen. Is this a concern to you? Is it ok for Molly Taylor to proceed with surgery?         Thank you,    Willeen Cass, FNP-BC    [facility] Short Stay Surgical Center/Anesthesiology    Phone: [phone]    09/24/2015 3:42 PM     Attempted to call patient to inform her of need for ECHO to be done prior to surgery. Unable to leave message after several rings - no VM picked up.  Will try again later.

## 2015-09-26 NOTE — Telephone Encounter (Signed)
Patient agrees to ECHO. Test ordered for ASAP scheduling.

## 2015-09-28 DIAGNOSIS — J069 Acute upper respiratory infection, unspecified: Secondary | ICD-10-CM | POA: Diagnosis not present

## 2015-09-30 ENCOUNTER — Ambulatory Visit (HOSPITAL_BASED_OUTPATIENT_CLINIC_OR_DEPARTMENT_OTHER): Payer: Medicare Other

## 2015-09-30 ENCOUNTER — Other Ambulatory Visit: Payer: Self-pay

## 2015-09-30 DIAGNOSIS — Z79899 Other long term (current) drug therapy: Secondary | ICD-10-CM

## 2015-09-30 DIAGNOSIS — Z01818 Encounter for other preprocedural examination: Secondary | ICD-10-CM | POA: Diagnosis not present

## 2015-09-30 DIAGNOSIS — Z96652 Presence of left artificial knee joint: Secondary | ICD-10-CM | POA: Diagnosis present

## 2015-09-30 DIAGNOSIS — Z950 Presence of cardiac pacemaker: Secondary | ICD-10-CM

## 2015-09-30 DIAGNOSIS — I340 Nonrheumatic mitral (valve) insufficiency: Secondary | ICD-10-CM

## 2015-09-30 DIAGNOSIS — I77810 Thoracic aortic ectasia: Secondary | ICD-10-CM

## 2015-09-30 DIAGNOSIS — I4891 Unspecified atrial fibrillation: Secondary | ICD-10-CM

## 2015-09-30 DIAGNOSIS — I351 Nonrheumatic aortic (valve) insufficiency: Secondary | ICD-10-CM | POA: Insufficient documentation

## 2015-09-30 DIAGNOSIS — I119 Hypertensive heart disease without heart failure: Secondary | ICD-10-CM

## 2015-09-30 DIAGNOSIS — E785 Hyperlipidemia, unspecified: Secondary | ICD-10-CM | POA: Insufficient documentation

## 2015-09-30 DIAGNOSIS — I10 Essential (primary) hypertension: Secondary | ICD-10-CM | POA: Diagnosis present

## 2015-09-30 DIAGNOSIS — M1711 Unilateral primary osteoarthritis, right knee: Principal | ICD-10-CM | POA: Diagnosis present

## 2015-09-30 DIAGNOSIS — Z7901 Long term (current) use of anticoagulants: Secondary | ICD-10-CM

## 2015-09-30 DIAGNOSIS — K219 Gastro-esophageal reflux disease without esophagitis: Secondary | ICD-10-CM | POA: Diagnosis present

## 2015-09-30 DIAGNOSIS — Z85528 Personal history of other malignant neoplasm of kidney: Secondary | ICD-10-CM

## 2015-09-30 DIAGNOSIS — Z882 Allergy status to sulfonamides status: Secondary | ICD-10-CM

## 2015-09-30 DIAGNOSIS — I371 Nonrheumatic pulmonary valve insufficiency: Secondary | ICD-10-CM | POA: Insufficient documentation

## 2015-09-30 DIAGNOSIS — Z885 Allergy status to narcotic agent status: Secondary | ICD-10-CM

## 2015-10-01 ENCOUNTER — Telehealth: Payer: Self-pay

## 2015-10-01 DIAGNOSIS — I351 Nonrheumatic aortic (valve) insufficiency: Secondary | ICD-10-CM

## 2015-10-01 DIAGNOSIS — I77810 Thoracic aortic ectasia: Secondary | ICD-10-CM

## 2015-10-01 NOTE — Telephone Encounter (Signed)
-----   Message from Sueanne Margarita, MD sent at 10/01/2015 11:38 AM EDT ----- Echo showed normal LVF with moderate LVH, increased stiffness of heart muscle, mild AR, mildly dilated aortic root, mild biatrial enlargement.  Repeat echo in 1 year for dilated aorta. Compared to echo 2 years ago the aortic root diameter has increased from 40 to 8mm. Please get a chest CT angio to assess further.

## 2015-10-01 NOTE — Telephone Encounter (Signed)
Informed patient of results and verbal understanding expressed.  ECHO ordered to be scheduled in 1 year. Chest CTA ordered for scheduling. Patient agrees with treatment plan.

## 2015-10-02 ENCOUNTER — Ambulatory Visit (INDEPENDENT_AMBULATORY_CARE_PROVIDER_SITE_OTHER)
Admission: RE | Admit: 2015-10-02 | Discharge: 2015-10-02 | Disposition: A | Discharge status: Home / Self Care | Payer: Medicare Other | Source: Ambulatory Visit | Attending: Cardiology | Admitting: Cardiology

## 2015-10-02 DIAGNOSIS — I351 Nonrheumatic aortic (valve) insufficiency: Secondary | ICD-10-CM

## 2015-10-02 DIAGNOSIS — I77810 Thoracic aortic ectasia: Secondary | ICD-10-CM | POA: Diagnosis not present

## 2015-10-02 MED ORDER — TRANEXAMIC ACID 1000 MG/10ML IV SOLN
1000.0000 mg | INTRAVENOUS | Status: AC
  Administered 2015-10-03: 1000 mg via INTRAVENOUS
  Filled 2015-10-02: qty 10

## 2015-10-02 MED ORDER — SODIUM CHLORIDE 0.9 % IV SOLN: INTRAVENOUS | Status: DC

## 2015-10-02 MED ORDER — CEFAZOLIN SODIUM-DEXTROSE 2-4 GM/100ML-% IV SOLN
2.0000 g | INTRAVENOUS | Status: AC
  Administered 2015-10-03: 2 g via INTRAVENOUS
  Filled 2015-10-02: qty 100

## 2015-10-02 MED ORDER — IOPAMIDOL (ISOVUE-370) INJECTION 76%
100.0000 mL | Freq: Once | INTRAVENOUS | Status: AC | PRN
  Administered 2015-10-02: 100 mL via INTRAVENOUS

## 2015-10-03 ENCOUNTER — Inpatient Hospital Stay (HOSPITAL_COMMUNITY): Payer: Medicare Other | Admitting: Vascular Surgery

## 2015-10-03 ENCOUNTER — Inpatient Hospital Stay (HOSPITAL_COMMUNITY)
Admission: RE | Admit: 2015-10-03 | Discharge: 2015-10-06 | DRG: 470 | Disposition: A | Discharge status: Skilled Nursing Facility | Payer: Medicare Other | Source: Ambulatory Visit | Attending: Orthopedic Surgery | Admitting: Orthopedic Surgery

## 2015-10-03 ENCOUNTER — Inpatient Hospital Stay (HOSPITAL_COMMUNITY): Payer: Medicare Other | Admitting: Certified Registered"

## 2015-10-03 ENCOUNTER — Encounter (HOSPITAL_COMMUNITY)
Admission: RE | Disposition: A | Discharge status: Skilled Nursing Facility | Payer: Self-pay | Source: Ambulatory Visit | Attending: Orthopedic Surgery

## 2015-10-03 ENCOUNTER — Encounter (HOSPITAL_COMMUNITY): Payer: Self-pay | Admitting: *Deleted

## 2015-10-03 DIAGNOSIS — I10 Essential (primary) hypertension: Secondary | ICD-10-CM | POA: Diagnosis present

## 2015-10-03 DIAGNOSIS — Z882 Allergy status to sulfonamides status: Secondary | ICD-10-CM | POA: Diagnosis not present

## 2015-10-03 DIAGNOSIS — M1711 Unilateral primary osteoarthritis, right knee: Secondary | ICD-10-CM | POA: Diagnosis present

## 2015-10-03 DIAGNOSIS — M25561 Pain in right knee: Secondary | ICD-10-CM | POA: Diagnosis not present

## 2015-10-03 DIAGNOSIS — Z96651 Presence of right artificial knee joint: Secondary | ICD-10-CM | POA: Diagnosis not present

## 2015-10-03 DIAGNOSIS — K219 Gastro-esophageal reflux disease without esophagitis: Secondary | ICD-10-CM | POA: Diagnosis present

## 2015-10-03 DIAGNOSIS — I4891 Unspecified atrial fibrillation: Secondary | ICD-10-CM | POA: Diagnosis present

## 2015-10-03 DIAGNOSIS — Z7901 Long term (current) use of anticoagulants: Secondary | ICD-10-CM | POA: Diagnosis not present

## 2015-10-03 DIAGNOSIS — M179 Osteoarthritis of knee, unspecified: Secondary | ICD-10-CM | POA: Diagnosis not present

## 2015-10-03 DIAGNOSIS — M6281 Muscle weakness (generalized): Secondary | ICD-10-CM | POA: Diagnosis not present

## 2015-10-03 DIAGNOSIS — Z85528 Personal history of other malignant neoplasm of kidney: Secondary | ICD-10-CM | POA: Diagnosis not present

## 2015-10-03 DIAGNOSIS — R2681 Unsteadiness on feet: Secondary | ICD-10-CM | POA: Diagnosis not present

## 2015-10-03 DIAGNOSIS — Z79899 Other long term (current) drug therapy: Secondary | ICD-10-CM | POA: Diagnosis not present

## 2015-10-03 DIAGNOSIS — Z96652 Presence of left artificial knee joint: Secondary | ICD-10-CM | POA: Diagnosis present

## 2015-10-03 DIAGNOSIS — R278 Other lack of coordination: Secondary | ICD-10-CM | POA: Diagnosis not present

## 2015-10-03 DIAGNOSIS — Z950 Presence of cardiac pacemaker: Secondary | ICD-10-CM | POA: Diagnosis not present

## 2015-10-03 DIAGNOSIS — Z885 Allergy status to narcotic agent status: Secondary | ICD-10-CM | POA: Diagnosis not present

## 2015-10-03 DIAGNOSIS — Z471 Aftercare following joint replacement surgery: Secondary | ICD-10-CM | POA: Diagnosis not present

## 2015-10-03 HISTORY — PX: TOTAL KNEE ARTHROPLASTY: SHX125

## 2015-10-03 LAB — PROTIME-INR
INR: 1.3 (ref 0.00–1.49)
Prothrombin Time: 16.3 seconds — ABNORMAL HIGH (ref 11.6–15.2)

## 2015-10-03 LAB — APTT: aPTT: 31 seconds (ref 24–37)

## 2015-10-03 SURGERY — ARTHROPLASTY, KNEE, TOTAL
Anesthesia: Monitor Anesthesia Care | Laterality: Right

## 2015-10-03 MED ORDER — OXYCODONE HCL 5 MG PO TABS
ORAL_TABLET | ORAL | Status: AC
  Filled 2015-10-03: qty 1

## 2015-10-03 MED ORDER — CHLORHEXIDINE GLUCONATE 4 % EX LIQD: 60.0000 mL | Freq: Once | CUTANEOUS | Status: DC

## 2015-10-03 MED ORDER — TRANEXAMIC ACID 1000 MG/10ML IV SOLN
1000.0000 mg | Freq: Once | INTRAVENOUS | Status: AC
  Administered 2015-10-03: 1000 mg via INTRAVENOUS
  Filled 2015-10-03: qty 10

## 2015-10-03 MED ORDER — FENTANYL CITRATE (PF) 100 MCG/2ML IJ SOLN
INTRAMUSCULAR | Status: AC
  Filled 2015-10-03: qty 2

## 2015-10-03 MED ORDER — HYDROMORPHONE HCL 1 MG/ML IJ SOLN
INTRAMUSCULAR | Status: AC
  Filled 2015-10-03: qty 1

## 2015-10-03 MED ORDER — PROPOFOL 10 MG/ML IV BOLUS
INTRAVENOUS | Status: DC | PRN
  Administered 2015-10-03: 10 mg via INTRAVENOUS
  Administered 2015-10-03: 20 mg via INTRAVENOUS

## 2015-10-03 MED ORDER — TRANEXAMIC ACID 1000 MG/10ML IV SOLN
2000.0000 mg | INTRAVENOUS | Status: AC
  Administered 2015-10-03: 2000 mg via TOPICAL
  Filled 2015-10-03: qty 20

## 2015-10-03 MED ORDER — METOPROLOL SUCCINATE ER 25 MG PO TB24
25.0000 mg | ORAL_TABLET | Freq: Every day | ORAL | Status: DC
Start: 2015-10-04 — End: 2015-10-06
  Administered 2015-10-04 – 2015-10-06 (×3): 25 mg via ORAL
  Filled 2015-10-03 (×3): qty 1

## 2015-10-03 MED ORDER — PROPOFOL 10 MG/ML IV BOLUS
INTRAVENOUS | Status: AC
  Filled 2015-10-03: qty 20

## 2015-10-03 MED ORDER — FENTANYL CITRATE (PF) 100 MCG/2ML IJ SOLN
INTRAMUSCULAR | Status: DC | PRN
  Administered 2015-10-03: 25 ug via INTRAVENOUS

## 2015-10-03 MED ORDER — METOCLOPRAMIDE HCL 5 MG/ML IJ SOLN
5.0000 mg | Freq: Three times a day (TID) | INTRAMUSCULAR | Status: DC | PRN
  Administered 2015-10-03: 10 mg via INTRAVENOUS
  Filled 2015-10-03: qty 2

## 2015-10-03 MED ORDER — FENTANYL CITRATE (PF) 250 MCG/5ML IJ SOLN
INTRAMUSCULAR | Status: AC
  Filled 2015-10-03: qty 5

## 2015-10-03 MED ORDER — AMIODARONE HCL 100 MG PO TABS
200.0000 mg | ORAL_TABLET | Freq: Every day | ORAL | Status: DC
  Administered 2015-10-04 – 2015-10-06 (×3): 200 mg via ORAL
  Filled 2015-10-03 (×3): qty 2

## 2015-10-03 MED ORDER — HYDROMORPHONE HCL 1 MG/ML IJ SOLN: 0.2500 mg | INTRAMUSCULAR | Status: DC | PRN

## 2015-10-03 MED ORDER — BUPIVACAINE-EPINEPHRINE 0.5% -1:200000 IJ SOLN
INTRAMUSCULAR | Status: DC | PRN
  Administered 2015-10-03: 50 mL

## 2015-10-03 MED ORDER — ONDANSETRON HCL 4 MG/2ML IJ SOLN
INTRAMUSCULAR | Status: DC | PRN
  Administered 2015-10-03: 4 mg via INTRAVENOUS

## 2015-10-03 MED ORDER — APIXABAN 2.5 MG PO TABS: 2.5000 mg | ORAL_TABLET | Freq: Two times a day (BID) | ORAL | Status: DC

## 2015-10-03 MED ORDER — BUPIVACAINE-EPINEPHRINE (PF) 0.25% -1:200000 IJ SOLN
INTRAMUSCULAR | Status: AC
  Filled 2015-10-03: qty 30

## 2015-10-03 MED ORDER — VANCOMYCIN HCL IN DEXTROSE 1-5 GM/200ML-% IV SOLN
INTRAVENOUS | Status: AC
  Filled 2015-10-03: qty 200

## 2015-10-03 MED ORDER — MORPHINE SULFATE (PF) 2 MG/ML IV SOLN
1.0000 mg | INTRAVENOUS | Status: DC | PRN
  Administered 2015-10-05: 1 mg via INTRAVENOUS
  Filled 2015-10-03: qty 1

## 2015-10-03 MED ORDER — APIXABAN 2.5 MG PO TABS
2.5000 mg | ORAL_TABLET | Freq: Two times a day (BID) | ORAL | Status: DC
Start: 2015-10-04 — End: 2015-10-05
  Administered 2015-10-04 – 2015-10-05 (×3): 2.5 mg via ORAL
  Filled 2015-10-03 (×3): qty 1

## 2015-10-03 MED ORDER — HYDROCODONE-ACETAMINOPHEN 5-325 MG PO TABS: 1.0000 | ORAL_TABLET | ORAL | Status: DC | PRN

## 2015-10-03 MED ORDER — PROMETHAZINE HCL 12.5 MG PO TABS: 12.5000 mg | ORAL_TABLET | Freq: Four times a day (QID) | ORAL | Status: DC | PRN

## 2015-10-03 MED ORDER — BUPIVACAINE LIPOSOME 1.3 % IJ SUSP
20.0000 mL | INTRAMUSCULAR | Status: AC
  Administered 2015-10-03: 20 mL
  Filled 2015-10-03: qty 20

## 2015-10-03 MED ORDER — ACETAMINOPHEN ER 650 MG PO TBCR: 650.0000 mg | EXTENDED_RELEASE_TABLET | Freq: Three times a day (TID) | ORAL | Status: DC | PRN

## 2015-10-03 MED ORDER — BISACODYL 5 MG PO TBEC
5.0000 mg | DELAYED_RELEASE_TABLET | Freq: Every day | ORAL | Status: DC | PRN
  Administered 2015-10-05: 5 mg via ORAL
  Filled 2015-10-03 (×2): qty 1

## 2015-10-03 MED ORDER — ONDANSETRON HCL 4 MG PO TABS
4.0000 mg | ORAL_TABLET | Freq: Four times a day (QID) | ORAL | Status: DC
  Administered 2015-10-04 – 2015-10-06 (×3): 4 mg via ORAL
  Filled 2015-10-03 (×4): qty 1

## 2015-10-03 MED ORDER — PHENOL 1.4 % MT LIQD: 1.0000 | OROMUCOSAL | Status: DC | PRN

## 2015-10-03 MED ORDER — HYDROCODONE-ACETAMINOPHEN 5-325 MG PO TABS
1.0000 | ORAL_TABLET | ORAL | Status: DC | PRN
  Administered 2015-10-03 – 2015-10-04 (×3): 2 via ORAL
  Administered 2015-10-05 – 2015-10-06 (×4): 1 via ORAL
  Filled 2015-10-03 (×4): qty 1
  Filled 2015-10-03 (×3): qty 2

## 2015-10-03 MED ORDER — DOCUSATE SODIUM 100 MG PO CAPS
100.0000 mg | ORAL_CAPSULE | Freq: Two times a day (BID) | ORAL | Status: DC
Start: 2015-10-03 — End: 2015-10-06
  Administered 2015-10-03 – 2015-10-06 (×6): 100 mg via ORAL
  Filled 2015-10-03 (×6): qty 1

## 2015-10-03 MED ORDER — VANCOMYCIN HCL IN DEXTROSE 1-5 GM/200ML-% IV SOLN
1000.0000 mg | Freq: Two times a day (BID) | INTRAVENOUS | Status: AC
  Administered 2015-10-04: 1000 mg via INTRAVENOUS
  Filled 2015-10-03: qty 200

## 2015-10-03 MED ORDER — DILTIAZEM HCL ER COATED BEADS 240 MG PO CP24
240.0000 mg | ORAL_CAPSULE | Freq: Every day | ORAL | Status: DC
  Administered 2015-10-04 – 2015-10-06 (×3): 240 mg via ORAL
  Filled 2015-10-03 (×3): qty 1

## 2015-10-03 MED ORDER — LIDOCAINE 2% (20 MG/ML) 5 ML SYRINGE
INTRAMUSCULAR | Status: AC
  Filled 2015-10-03: qty 5

## 2015-10-03 MED ORDER — PHENYLEPHRINE HCL 10 MG/ML IJ SOLN
10.0000 mg | INTRAMUSCULAR | Status: DC | PRN
  Administered 2015-10-03: 25 ug/min via INTRAVENOUS

## 2015-10-03 MED ORDER — BUPIVACAINE IN DEXTROSE 0.75-8.25 % IT SOLN
INTRATHECAL | Status: DC | PRN
  Administered 2015-10-03: 1.8 mL via INTRATHECAL

## 2015-10-03 MED ORDER — APIXABAN 2.5 MG PO TABS
2.5000 mg | ORAL_TABLET | Freq: Two times a day (BID) | ORAL | Status: DC
Start: 2015-10-03 — End: 2015-10-03

## 2015-10-03 MED ORDER — ONDANSETRON HCL 4 MG/2ML IJ SOLN
4.0000 mg | Freq: Four times a day (QID) | INTRAMUSCULAR | Status: DC
  Administered 2015-10-03 – 2015-10-05 (×5): 4 mg via INTRAVENOUS
  Filled 2015-10-03 (×6): qty 2

## 2015-10-03 MED ORDER — POLYETHYLENE GLYCOL 3350 17 G PO PACK
17.0000 g | PACK | Freq: Every day | ORAL | Status: DC | PRN
  Administered 2015-10-06: 17 g via ORAL
  Filled 2015-10-03: qty 1

## 2015-10-03 MED ORDER — ACETAMINOPHEN 325 MG PO TABS: 650.0000 mg | ORAL_TABLET | Freq: Four times a day (QID) | ORAL | Status: DC | PRN

## 2015-10-03 MED ORDER — PROPOFOL 500 MG/50ML IV EMUL
INTRAVENOUS | Status: DC | PRN
  Administered 2015-10-03: 50 ug/kg/min via INTRAVENOUS

## 2015-10-03 MED ORDER — OXYCODONE HCL 5 MG/5ML PO SOLN: 5.0000 mg | Freq: Once | ORAL | Status: AC | PRN

## 2015-10-03 MED ORDER — MENTHOL 3 MG MT LOZG: 1.0000 | LOZENGE | OROMUCOSAL | Status: DC | PRN

## 2015-10-03 MED ORDER — CEFAZOLIN IN D5W 1 GM/50ML IV SOLN: 1.0000 g | Freq: Four times a day (QID) | INTRAVENOUS | Status: DC

## 2015-10-03 MED ORDER — HYDROMORPHONE HCL 1 MG/ML IJ SOLN
0.2500 mg | INTRAMUSCULAR | Status: DC | PRN
  Administered 2015-10-03 (×2): 0.5 mg via INTRAVENOUS

## 2015-10-03 MED ORDER — ACETAMINOPHEN 650 MG RE SUPP: 650.0000 mg | Freq: Four times a day (QID) | RECTAL | Status: DC | PRN

## 2015-10-03 MED ORDER — VANCOMYCIN HCL 1000 MG IV SOLR
1000.0000 mg | Freq: Once | INTRAVENOUS | Status: DC
  Administered 2015-10-03: 1000 mg via INTRAVENOUS

## 2015-10-03 MED ORDER — ONDANSETRON HCL 4 MG/2ML IJ SOLN
INTRAMUSCULAR | Status: AC
  Filled 2015-10-03: qty 2

## 2015-10-03 MED ORDER — LIDOCAINE HCL (CARDIAC) 20 MG/ML IV SOLN
INTRAVENOUS | Status: DC | PRN
Start: 2015-10-03 — End: 2015-10-03
  Administered 2015-10-03 (×2): 20 mg via INTRAVENOUS

## 2015-10-03 MED ORDER — VANCOMYCIN HCL IN DEXTROSE 1-5 GM/200ML-% IV SOLN
1000.0000 mg | INTRAVENOUS | Status: DC
  Filled 2015-10-03: qty 200

## 2015-10-03 MED ORDER — OXYCODONE HCL 5 MG/5ML PO SOLN: 5.0000 mg | Freq: Once | ORAL | Status: DC | PRN

## 2015-10-03 MED ORDER — SODIUM CHLORIDE 0.9 % IR SOLN
Status: DC | PRN
  Administered 2015-10-03: 1000 mL

## 2015-10-03 MED ORDER — METOCLOPRAMIDE HCL 5 MG PO TABS
5.0000 mg | ORAL_TABLET | Freq: Three times a day (TID) | ORAL | Status: DC | PRN
Start: 2015-10-03 — End: 2015-10-06

## 2015-10-03 MED ORDER — LACTATED RINGERS IV SOLN
INTRAVENOUS | Status: DC
  Administered 2015-10-03 (×2): via INTRAVENOUS

## 2015-10-03 MED ORDER — SODIUM CHLORIDE 0.9 % IV SOLN
INTRAVENOUS | Status: DC
  Administered 2015-10-03: 17:00:00 via INTRAVENOUS

## 2015-10-03 MED ORDER — FLEET ENEMA 7-19 GM/118ML RE ENEM: 1.0000 | ENEMA | Freq: Once | RECTAL | Status: DC | PRN

## 2015-10-03 MED ORDER — OXYCODONE HCL 5 MG PO TABS: 5.0000 mg | ORAL_TABLET | Freq: Once | ORAL | Status: DC | PRN

## 2015-10-03 MED ORDER — OXYCODONE HCL 5 MG PO TABS
5.0000 mg | ORAL_TABLET | Freq: Once | ORAL | Status: AC | PRN
  Administered 2015-10-03: 5 mg via ORAL

## 2015-10-03 SURGICAL SUPPLY — 68 items
BANDAGE ACE 4X5 VEL STRL LF (GAUZE/BANDAGES/DRESSINGS) ×2 IMPLANT
BANDAGE ACE 6X5 VEL STRL LF (GAUZE/BANDAGES/DRESSINGS) ×2 IMPLANT
BANDAGE ELASTIC 4 VELCRO ST LF (GAUZE/BANDAGES/DRESSINGS) ×3 IMPLANT
BANDAGE ELASTIC 6 VELCRO ST LF (GAUZE/BANDAGES/DRESSINGS) ×3 IMPLANT
BANDAGE ESMARK 6X9 LF (GAUZE/BANDAGES/DRESSINGS) ×1 IMPLANT
BLADE SAGITTAL 25.0X1.19X90 (BLADE) ×2 IMPLANT
BLADE SAGITTAL 25.0X1.19X90MM (BLADE) ×1
BLADE SAW SAG 90X13X1.27 (BLADE) ×3 IMPLANT
BNDG CMPR 9X6 STRL LF SNTH (GAUZE/BANDAGES/DRESSINGS) ×1
BNDG ESMARK 6X9 LF (GAUZE/BANDAGES/DRESSINGS) ×3
BONE CEMENT GENTAMICIN (Cement) ×6 IMPLANT
BOWL SMART MIX CTS (DISPOSABLE) ×3 IMPLANT
CAP KNEE TOTAL 3 SIGMA ×2 IMPLANT
CEMENT BONE GENTAMICIN 40 (Cement) IMPLANT
COVER SURGICAL LIGHT HANDLE (MISCELLANEOUS) ×3 IMPLANT
CUFF TOURNIQUET SINGLE 34IN LL (TOURNIQUET CUFF) ×3 IMPLANT
CUFF TOURNIQUET SINGLE 44IN (TOURNIQUET CUFF) IMPLANT
DRAPE INCISE IOBAN 66X45 STRL (DRAPES) IMPLANT
DRAPE ORTHO SPLIT 77X108 STRL (DRAPES) ×6
DRAPE SURG ORHT 6 SPLT 77X108 (DRAPES) ×2 IMPLANT
DRAPE U-SHAPE 47X51 STRL (DRAPES) ×3 IMPLANT
DRSG ADAPTIC 3X8 NADH LF (GAUZE/BANDAGES/DRESSINGS) ×3 IMPLANT
DRSG PAD ABDOMINAL 8X10 ST (GAUZE/BANDAGES/DRESSINGS) ×4 IMPLANT
DURAPREP 26ML APPLICATOR (WOUND CARE) ×3 IMPLANT
ELECT REM PT RETURN 9FT ADLT (ELECTROSURGICAL) ×3
ELECTRODE REM PT RTRN 9FT ADLT (ELECTROSURGICAL) ×1 IMPLANT
EVACUATOR 1/8 PVC DRAIN (DRAIN) IMPLANT
FACESHIELD WRAPAROUND (MASK) ×6 IMPLANT
FACESHIELD WRAPAROUND OR TEAM (MASK) ×2 IMPLANT
FLOSEAL 10ML (HEMOSTASIS) IMPLANT
GAUZE SPONGE 4X4 12PLY STRL (GAUZE/BANDAGES/DRESSINGS) ×3 IMPLANT
GLOVE BIOGEL PI IND STRL 8 (GLOVE) ×4 IMPLANT
GLOVE BIOGEL PI INDICATOR 8 (GLOVE) ×8
GLOVE ORTHO TXT STRL SZ7.5 (GLOVE) ×3 IMPLANT
GLOVE SURG ORTHO 8.0 STRL STRW (GLOVE) ×3 IMPLANT
GOWN STRL REUS W/ TWL LRG LVL3 (GOWN DISPOSABLE) ×2 IMPLANT
GOWN STRL REUS W/ TWL XL LVL3 (GOWN DISPOSABLE) ×1 IMPLANT
GOWN STRL REUS W/TWL 2XL LVL3 (GOWN DISPOSABLE) ×3 IMPLANT
GOWN STRL REUS W/TWL LRG LVL3 (GOWN DISPOSABLE) ×6
GOWN STRL REUS W/TWL XL LVL3 (GOWN DISPOSABLE) ×3
HANDPIECE INTERPULSE COAX TIP (DISPOSABLE) ×3
HOOD PEEL AWAY FACE SHEILD DIS (HOOD) ×3 IMPLANT
IMMOBILIZER KNEE 22 UNIV (SOFTGOODS) IMPLANT
KIT BASIN OR (CUSTOM PROCEDURE TRAY) ×3 IMPLANT
KIT ROOM TURNOVER OR (KITS) ×3 IMPLANT
MANIFOLD NEPTUNE II (INSTRUMENTS) ×3 IMPLANT
NEEDLE 22X1 1/2 (OR ONLY) (NEEDLE) IMPLANT
NS IRRIG 1000ML POUR BTL (IV SOLUTION) ×3 IMPLANT
PACK TOTAL JOINT (CUSTOM PROCEDURE TRAY) ×3 IMPLANT
PAD ARMBOARD 7.5X6 YLW CONV (MISCELLANEOUS) ×6 IMPLANT
PAD CAST 4YDX4 CTTN HI CHSV (CAST SUPPLIES) ×1 IMPLANT
PADDING CAST COTTON 4X4 STRL (CAST SUPPLIES) ×3
PADDING CAST COTTON 6X4 STRL (CAST SUPPLIES) ×3 IMPLANT
SET HNDPC FAN SPRY TIP SCT (DISPOSABLE) ×1 IMPLANT
SPONGE GAUZE 4X4 12PLY STER LF (GAUZE/BANDAGES/DRESSINGS) ×2 IMPLANT
STAPLER VISISTAT 35W (STAPLE) ×3 IMPLANT
SUCTION FRAZIER HANDLE 10FR (MISCELLANEOUS) ×2
SUCTION TUBE FRAZIER 10FR DISP (MISCELLANEOUS) ×1 IMPLANT
SUT ETHIBOND NAB CT1 #1 30IN (SUTURE) ×9 IMPLANT
SUT VIC AB 0 CT1 27 (SUTURE) ×3
SUT VIC AB 0 CT1 27XBRD ANBCTR (SUTURE) ×1 IMPLANT
SUT VIC AB 2-0 CT1 27 (SUTURE) ×6
SUT VIC AB 2-0 CT1 TAPERPNT 27 (SUTURE) ×2 IMPLANT
SYR CONTROL 10ML LL (SYRINGE) ×2 IMPLANT
TOWEL OR 17X24 6PK STRL BLUE (TOWEL DISPOSABLE) ×3 IMPLANT
TOWEL OR 17X26 10 PK STRL BLUE (TOWEL DISPOSABLE) ×3 IMPLANT
TRAY FOLEY CATH 16FRSI W/METER (SET/KITS/TRAYS/PACK) ×2 IMPLANT
WATER STERILE IRR 1000ML POUR (IV SOLUTION) ×1 IMPLANT

## 2015-10-03 NOTE — Anesthesia Preprocedure Evaluation (Signed)
Anesthesia Evaluation  Patient identified by MRN, date of birth, ID band Patient awake    Reviewed: Allergy & Precautions, NPO status , Patient's Chart, lab work & pertinent test results, reviewed documented beta blocker date and time   History of Anesthesia Complications (+) PONV and history of anesthetic complications  Airway Mallampati: II  TM Distance: >3 FB Neck ROM: Full    Dental  (+) Partial Upper, Teeth Intact   Pulmonary shortness of breath,    breath sounds clear to auscultation       Cardiovascular hypertension, Pt. on medications and Pt. on home beta blockers + dysrhythmias Atrial Fibrillation + pacemaker  Rhythm:Irregular     Neuro/Psych neg Seizures  Neuromuscular disease negative psych ROS   GI/Hepatic Neg liver ROS, GERD  Controlled,  Endo/Other    Renal/GU Renal disease     Musculoskeletal  (+) Arthritis ,   Abdominal   Peds  Hematology   Anesthesia Other Findings   Reproductive/Obstetrics                             Anesthesia Physical Anesthesia Plan  ASA: III  Anesthesia Plan: MAC and Spinal   Post-op Pain Management:    Induction:   Airway Management Planned: Natural Airway, Nasal Cannula and Simple Face Mask  Additional Equipment: None  Intra-op Plan:   Post-operative Plan:   Informed Consent: I have reviewed the patients History and Physical, chart, labs and discussed the procedure including the risks, benefits and alternatives for the proposed anesthesia with the patient or authorized representative who has indicated his/her understanding and acceptance.   Dental advisory given  Plan Discussed with: CRNA and Surgeon  Anesthesia Plan Comments:         Anesthesia Quick Evaluation

## 2015-10-03 NOTE — Progress Notes (Signed)
Orthopedic Tech Progress Note Patient Details:  PRIMA STOVES [birth year] [id number]  CPM Right Knee CPM Right Knee: On Right Knee Flexion (Degrees): 0 Right Knee Extension (Degrees): 0 Additional Comments: trapeze bar patient helper Viewed order from doctor's order list  Hildred Priest 10/03/2015, 2:12 PM

## 2015-10-03 NOTE — Progress Notes (Signed)
Pt agrees to hold po meds until nausea subsides.

## 2015-10-03 NOTE — Transfer of Care (Signed)
Immediate Anesthesia Transfer of Care Note  Patient: Molly Taylor  Procedure(s) Performed: Procedure(s): RIGHT TOTAL KNEE ARTHROPLASTY (Right)  Patient Location: PACU  Anesthesia Type:Spinal  Level of Consciousness:  sedated, patient cooperative and responds to stimulation  Airway & Oxygen Therapy:Patient Spontanous Breathing and Patient connected to face mask oxgen  Post-op Assessment:  Report given to PACU RN and Post -op Vital signs reviewed and stable  Post vital signs:  Reviewed and stable  Last Vitals:  Filed Vitals:   10/03/15 0917  BP: 151/79  Pulse: 77  Temp: 36.7 C  Resp: 18    Complications: No apparent anesthesia complications

## 2015-10-03 NOTE — Anesthesia Procedure Notes (Signed)
Spinal Patient location during procedure: OR Staffing Anesthesiologist: Kyndel Egger Preanesthetic Checklist Completed: patient identified, surgical consent, pre-op evaluation, timeout performed, IV checked, risks and benefits discussed and monitors and equipment checked Spinal Block Patient position: sitting Prep: site prepped and draped and DuraPrep Patient monitoring: heart rate, cardiac monitor, continuous pulse ox and blood pressure Approach: midline Location: L3-4 Injection technique: single-shot Needle Needle type: Pencan  Needle gauge: 24 G Needle length: 10 cm Assessment Sensory level: T6   

## 2015-10-03 NOTE — Brief Op Note (Signed)
10/03/2015  12:56 PM  PATIENT:  Molly Taylor  82 y.o. female  PRE-OPERATIVE DIAGNOSIS:  OA RIGHT KNEE  POST-OPERATIVE DIAGNOSIS:  same  PROCEDURE:  Procedure(s): RIGHT TOTAL KNEE ARTHROPLASTY (Right)  SURGEON:  Surgeon(s) and Role:    * Earlie Server, MD - Primary  PHYSICIAN ASSISTANT: Chriss Czar, PA-C  ASSISTANTS: none   ANESTHESIA:   local, regional and spinal  EBL:  Total I/O In: 1000 [I.V.:1000] Out: 750 [Urine:700; Blood:50]  BLOOD ADMINISTERED:none  DRAINS: none   LOCAL MEDICATIONS USED:  MARCAINE    and BUPIVICAINE   SPECIMEN:  No Specimen  DISPOSITION OF SPECIMEN:  N/A  COUNTS:  YES  TOURNIQUET:   Total Tourniquet Time Documented: Thigh (Right) - 56 minutes Total: Thigh (Right) - 56 minutes   DICTATION: .Other Dictation: Dictation Number unknown  PLAN OF CARE: Admit to inpatient   PATIENT DISPOSITION:  PACU - hemodynamically stable.   Delay start of Pharmacological VTE agent (>24hrs) due to surgical blood loss or risk of bleeding: yes

## 2015-10-04 LAB — BASIC METABOLIC PANEL
Anion gap: 8 (ref 5–15)
BUN: 12 mg/dL (ref 6–20)
CHLORIDE: 107 mmol/L (ref 101–111)
CO2: 25 mmol/L (ref 22–32)
CREATININE: 0.67 mg/dL (ref 0.44–1.00)
Calcium: 9.1 mg/dL (ref 8.9–10.3)
GFR calc Af Amer: >60 mL/min (ref >60)
GLUCOSE: 132 mg/dL — AB (ref 65–99)
Potassium: 4.2 mmol/L (ref 3.5–5.1)
SODIUM: 140 mmol/L (ref 135–145)

## 2015-10-04 LAB — CBC
HCT: 36.3 % (ref 36.0–46.0)
Hemoglobin: 12.2 g/dL (ref 12.0–15.0)
MCH: 31.3 pg (ref 26.0–34.0)
MCHC: 33.6 g/dL (ref 30.0–36.0)
MCV: 93.1 fL (ref 78.0–100.0)
PLATELETS: 103 10*3/uL — AB (ref 150–400)
RBC: 3.9 MIL/uL (ref 3.87–5.11)
RDW: 15.2 % (ref 11.5–15.5)
WBC: 10.2 10*3/uL (ref 4.0–10.5)

## 2015-10-04 MED ORDER — HYDROMORPHONE HCL 2 MG PO TABS
2.0000 mg | ORAL_TABLET | ORAL | Status: DC | PRN
  Administered 2015-10-04: 2 mg via ORAL
  Filled 2015-10-04: qty 1

## 2015-10-04 NOTE — Plan of Care (Signed)
Physical Therapy Treatment Patient Details Name: Molly Taylor MRN: [id number] DOB: 1927-01-09 Today's Date: 10/04/2015    History of Present Illness Pt had R TKA on 10/03/15. Pt seen mid morning 2nd to pain early in the morning. PMH: Pacemaker, thuimb arthrosocpy, A-fib    PT Comments    Patient tolerated treatment well. She was able to increase her gait distance although she is still limited. She ha d some pain with the CPM. Nursing will increase gradually to tolerance. Education provided on the importance of increasing the CPM and increasing knee flexion. Increase gait distance as tolerated.   Follow Up Recommendations  SNF     Equipment Recommendations  Rolling walker with 5" wheels    Recommendations for Other Services Rehab consult     Precautions / Restrictions Precautions Precautions: ICD/Pacemaker;Knee;Other (comment) Precaution Booklet Issued: No Restrictions Weight Bearing Restrictions: Yes RLE Weight Bearing: Weight bearing as tolerated    Mobility  Bed Mobility Overal bed mobility: Needs Assistance Bed Mobility: Supine to Sit     Supine to sit: Min assist     General bed mobility comments: Mina to get right lower extremity out of the bed. Min a to sit up at the edge of the bed.   Transfers Overall transfer level: Needs assistance Equipment used: Rolling walker (2 wheeled) Transfers: Sit to/from Stand Sit to Stand: Mod assist;+2 safety/equipment         General transfer comment: Difficulty weight bearing on right leower extremity. Patient transferd to commode and too the chair with modx2 using small stepas. Assist required for strength and stability.   Ambulation/Gait Ambulation/Gait assistance: Mod assist;+2 physical assistance Ambulation Distance (Feet): 2 Feet Assistive device: Rolling walker (2 wheeled) Gait Pattern/deviations: Step-to pattern;Decreased step length - right;Decreased stance time - right;Decreased weight shift to right   Gait  velocity interpretation: <1.8 ft/sec, indicative of risk for recurrent falls General Gait Details: 4' from commode to bed. Mod a for strength and balance. Mod cuing to weight bear on the left side.    Stairs            Wheelchair Mobility    Modified Rankin (Stroke Patients Only)       Balance Overall balance assessment: Needs assistance Sitting-balance support: No upper extremity supported Sitting balance-Leahy Scale: Good     Standing balance support: Bilateral upper extremity supported Standing balance-Leahy Scale: Good                      Cognition Arousal/Alertness: Awake/alert Behavior During Therapy: WFL for tasks assessed/performed Overall Cognitive Status: Within Functional Limits for tasks assessed                      Exercises Total Joint Exercises Ankle Circles/Pumps: 20 reps Quad Sets: 10 reps Heel Slides:  (reviewed with patient but unable to perfrom ) Goniometric ROM: 5-50 degrees with pain at end range     General Comments        Pertinent Vitals/Pain Pain Assessment: 0-10 Pain Score: 5  Pain Location: Right knee  Pain Descriptors / Indicators: Aching Pain Intervention(s): Limited activity within patient's tolerance;Monitored during session;Premedicated before session;Utilized relaxation techniques    Home Living Family/patient expects to be discharged to:: Skilled nursing facility Living Arrangements: Alone             Additional Comments: Patient has 2 small steps into the house. Her husband is also at rehabilitation at this time. She has children but none licve  with her.      Prior Function Level of Independence: Independent with assistive device(s)      Comments: Used a single point cane for ambulation    PT Goals (current goals can now be found in the care plan section) Acute Rehab PT Goals Patient Stated Goal: To get stronger  PT Goal Formulation: With patient Time For Goal Achievement: 10/18/15 Potential  to Achieve Goals: Good Progress towards PT goals: Progressing toward goals    Frequency  7X/week    PT Plan      Co-evaluation             End of Session Equipment Utilized During Treatment: Gait belt;Right knee immobilizer   Patient left: in CPM     Time: 1430-1500 PT Time Calculation (min) (ACUTE ONLY): 30 min  Charges:  $Therapeutic Exercise: 8-22 mins $Therapeutic Activity: 8-22 mins                    G Codes:      Carney Living 10/18/15, 3:54 PM

## 2015-10-04 NOTE — Plan of Care (Signed)
Physical Therapy Treatment Patient Details Name: Molly Taylor MRN: [id number] DOB: 1927/01/29 Today's Date: 10/04/2015    History of Present Illness Pt had R TKA on 10/03/15. Pt seen mid morning 2nd to pain early in the morning. PMH: Pacemaker, thuimb arthrosocpy, A-fib    PT Comments    Patient is S/P R TKA on 10/04/2015. She presents with limited mobility, decreased range of motion, strength, and increased pain with movement. She lives alone at this time as her husband is also at rehab. She requires assistance for all transfers. She would benefit from skilled acute rehab at this time. She would also benefit from further skilled rehab at a SNF to return to her prior level of function.   Follow Up Recommendations  SNF     Equipment Recommendations  Rolling walker with 5" wheels    Recommendations for Other Services Rehab consult     Precautions / Restrictions Precautions Precautions: ICD/Pacemaker;Knee Precaution Booklet Issued: No Required Braces or Orthoses: Knee Immobilizer - Right Restrictions Weight Bearing Restrictions: Yes RLE Weight Bearing: Weight bearing as tolerated    Mobility  Bed Mobility Overal bed mobility: Needs Assistance Bed Mobility: Supine to Sit     Supine to sit: Min assist     General bed mobility comments: Mina to get right lower extremity out of the bed. Min a to sit up at the edge of the bed.   Transfers Overall transfer level: Needs assistance Equipment used: Rolling walker (2 wheeled) Transfers: Sit to/from Stand Sit to Stand: Mod assist;+2 safety/equipment         General transfer comment: Difficulty weight bearing on right leower extremity. Patient transferd to commode and too the chair with modx2 using small stepas. Assist required for strength and stability.   Ambulation/Gait Ambulation/Gait assistance: Mod assist;+2 physical assistance Ambulation Distance (Feet): 2 Feet Assistive device: Rolling walker (2 wheeled) Gait  Pattern/deviations: Step-to pattern;Decreased step length - right;Decreased stance time - right;Decreased weight shift to right   Gait velocity interpretation: <1.8 ft/sec, indicative of risk for recurrent falls General Gait Details: 2' rto the commode and 2' to the stairs. Limited weight on the right lower extremity.    Stairs            Wheelchair Mobility    Modified Rankin (Stroke Patients Only)       Balance Overall balance assessment: Needs assistance Sitting-balance support: No upper extremity supported Sitting balance-Leahy Scale: Good     Standing balance support: Bilateral upper extremity supported Standing balance-Leahy Scale: Good                      Cognition Arousal/Alertness: Awake/alert Behavior During Therapy: WFL for tasks assessed/performed Overall Cognitive Status: Within Functional Limits for tasks assessed                      Exercises Total Joint Exercises Ankle Circles/Pumps: 20 reps Quad Sets: 10 reps    General Comments        Pertinent Vitals/Pain Pain Assessment: 0-10 Pain Score: 7  Pain Location: right knee Pain Descriptors / Indicators: Aching Pain Intervention(s): Monitored during session;Limited activity within patient's tolerance;Premedicated before session;Utilized relaxation techniques    Home Living Family/patient expects to be discharged to:: Skilled nursing facility Living Arrangements: Alone             Additional Comments: Patient has 2 small steps into the house. Her husband is also at rehabilitation at this time. She has children but  none licve with her.      Prior Function Level of Independence: Independent with assistive device(s)      Comments: Used a single point cane for ambulation    PT Goals (current goals can now be found in the care plan section) Acute Rehab PT Goals Patient Stated Goal: To get stronger  PT Goal Formulation: With patient Time For Goal Achievement:  10/18/15 Potential to Achieve Goals: Good    Frequency  7X/week    PT Plan      Co-evaluation             End of Session Equipment Utilized During Treatment: Gait belt;Right knee immobilizer         Time: [id number] PT Time Calculation (min) (ACUTE ONLY): 28 min  Charges:                       G Codes:      Carney Living PT DPT  10/04/2015, 3:36 PM

## 2015-10-04 NOTE — Progress Notes (Signed)
   Assessment: 1 Day Post-Op  S/P Procedure(s) (LRB): RIGHT TOTAL KNEE ARTHROPLASTY (Right) By Dr. French Ana on 10/03/15  Active Problems:   Primary localized osteoarthritis of right knee  Plan: Advance diet Up with therapy  Weight Bearing: Weight Bearing as Tolerated (WBAT) Right leg Dressings: PRN VTE prophylaxis: Eliquis, SCDs, ambulation Dispo: [place] possibly tomorrow.  Subjective: Pain controlled with PO meds.  Tolerating diet.  +Flatus.  No CP, SOB.  Not oob yet.  Objective:   VITALS:   Filed Vitals:   10/03/15 1455 10/03/15 1515 10/03/15 2152 10/04/15 0438  BP: 118/68  122/63 124/53  Pulse: 60  60 60  Temp: 97.5 F (36.4 C)  99.4 F (37.4 C) 99.3 F (37.4 C)  TempSrc:   Oral Oral  Resp: 18  24 20   Height:      Weight:      SpO2: 96% 96% 89% 86%    Lab Results  Component Value Date   WBC 10.2 10/04/2015   HGB 12.2 10/04/2015   HCT 36.3 10/04/2015   MCV 93.1 10/04/2015   PLT 103* 10/04/2015   BMET    Component Value Date/Time   NA 140 10/04/2015 0550   K 4.2 10/04/2015 0550   CL 107 10/04/2015 0550   CO2 25 10/04/2015 0550   GLUCOSE 132* 10/04/2015 0550   BUN 12 10/04/2015 0550   CREATININE 0.67 10/04/2015 0550   CALCIUM 9.1 10/04/2015 0550   GFRNONAA >60 10/04/2015 0550   GFRAA >60 10/04/2015 0550    Physical Exam General: NAD.  Liquid tray in lap Resp: No increased WOB ABD soft Neurologically intact MSK Neurovascular intact Sensation intact distally Intact pulses distally Dorsiflexion/Plantar flexion intact Incision: dressing C/D/I   Molly Taylor 10/04/2015, 9:59 AM

## 2015-10-04 NOTE — Progress Notes (Signed)
OT Cancellation Note  Patient Details Name: Molly Taylor MRN: [id number] DOB: 1926-07-10   Cancelled Treatment:    Reason Eval/Treat Not Completed: Other (comment)  Pt is Medicare  and current D/C plan is SNF. No apparent immediate acute care OT needs, therefore will defer OT to SNF. If OT eval is needed please call Acute Rehab Dept. at [phone] or text page OT at [phone].   [place], OTR/L  [id number] 10/04/2015 10/04/2015, 4:57 PM

## 2015-10-04 NOTE — Discharge Instructions (Signed)
INSTRUCTIONS AFTER JOINT REPLACEMENT  ° °o Remove items at home which could result in a fall. This includes throw rugs or furniture in walking pathways °o ICE to the affected joint every three hours while awake for 30 minutes at a time, for at least the first 3-5 days, and then as needed for pain and swelling.  Continue to use ice for pain and swelling. You may notice swelling that will progress down to the foot and ankle.  This is normal after surgery.  Elevate your leg when you are not up walking on it.   °o Continue to use the breathing machine you got in the hospital (incentive spirometer) which will help keep your temperature down.  It is common for your temperature to cycle up and down following surgery, especially at night when you are not up moving around and exerting yourself.  The breathing machine keeps your lungs expanded and your temperature down. ° ° °DIET:  As you were doing prior to hospitalization, we recommend a well-balanced diet. ° °DRESSING / WOUND CARE / SHOWERING ° °You may change your dressing 3-5 days after surgery.  Then change the dressing every day with sterile gauze.  Please use good hand washing techniques before changing the dressing.  Do not use any lotions or creams on the incision until instructed by your surgeon. ° °ACTIVITY ° °o Increase activity slowly as tolerated, but follow the weight bearing instructions below.   °o No driving for 6 weeks or until further direction given by your physician.  You cannot drive while taking narcotics.  °o No lifting or carrying greater than 10 lbs. until further directed by your surgeon. °o Avoid periods of inactivity such as sitting longer than an hour when not asleep. This helps prevent blood clots.  °o You may return to work once you are authorized by your doctor.  ° ° ° °WEIGHT BEARING  ° °Weight bearing as tolerated with assist device (walker, cane, etc) as directed, use it as long as suggested by your surgeon or therapist, typically at  least 4-6 weeks. ° ° °EXERCISES ° °Results after joint replacement surgery are often greatly improved when you follow the exercise, range of motion and muscle strengthening exercises prescribed by your doctor. Safety measures are also important to protect the joint from further injury. Any time any of these exercises cause you to have increased pain or swelling, decrease what you are doing until you are comfortable again and then slowly increase them. If you have problems or questions, call your caregiver or physical therapist for advice.  ° °Rehabilitation is important following a joint replacement. After just a few days of immobilization, the muscles of the leg can become weakened and shrink (atrophy).  These exercises are designed to build up the tone and strength of the thigh and leg muscles and to improve motion. Often times heat used for twenty to thirty minutes before working out will loosen up your tissues and help with improving the range of motion but do not use heat for the first two weeks following surgery (sometimes heat can increase post-operative swelling).  ° °These exercises can be done on a training (exercise) mat, on the floor, on a table or on a bed. Use whatever works the best and is most comfortable for you.    Use music or television while you are exercising so that the exercises are a pleasant break in your day. This will make your life better with the exercises acting as a break   in your routine that you can look forward to.   Perform all exercises about fifteen times, three times per day or as directed.  You should exercise both the operative leg and the other leg as well. ° °Exercises include: °  °• Quad Sets - Tighten up the muscle on the front of the thigh (Quad) and hold for 5-10 seconds.   °• Straight Leg Raises - With your knee straight (if you were given a brace, keep it on), lift the leg to 60 degrees, hold for 3 seconds, and slowly lower the leg.  Perform this exercise against  resistance later as your leg gets stronger.  °• Leg Slides: Lying on your back, slowly slide your foot toward your buttocks, bending your knee up off the floor (only go as far as is comfortable). Then slowly slide your foot back down until your leg is flat on the floor again.  °• Angel Wings: Lying on your back spread your legs to the side as far apart as you can without causing discomfort.  °• Hamstring Strength:  Lying on your back, push your heel against the floor with your leg straight by tightening up the muscles of your buttocks.  Repeat, but this time bend your knee to a comfortable angle, and push your heel against the floor.  You may put a pillow under the heel to make it more comfortable if necessary.  ° °A rehabilitation program following joint replacement surgery can speed recovery and prevent re-injury in the future due to weakened muscles. Contact your doctor or a physical therapist for more information on knee rehabilitation.  ° ° °CONSTIPATION ° °Constipation is defined medically as fewer than three stools per week and severe constipation as less than one stool per week.  Even if you have a regular bowel pattern at home, your normal regimen is likely to be disrupted due to multiple reasons following surgery.  Combination of anesthesia, postoperative narcotics, change in appetite and fluid intake all can affect your bowels.  ° °YOU MUST use at least one of the following options; they are listed in order of increasing strength to get the job done.  They are all available over the counter, and you may need to use some, POSSIBLY even all of these options:   ° °Drink plenty of fluids (prune juice may be helpful) and high fiber foods °Colace 100 mg by mouth twice a day  °Senokot for constipation as directed and as needed Dulcolax (bisacodyl), take with full glass of water  °Miralax (polyethylene glycol) once or twice a day as needed. ° °If you have tried all these things and are unable to have a bowel  movement in the first 3-4 days after surgery call either your surgeon or your primary doctor.   ° °If you experience loose stools or diarrhea, hold the medications until you stool forms back up.  If your symptoms do not get better within 1 week or if they get worse, check with your doctor.  If you experience "the worst abdominal pain ever" or develop nausea or vomiting, please contact the office immediately for further recommendations for treatment. ° ° °ITCHING:  If you experience itching with your medications, try taking only a single pain pill, or even half a pain pill at a time.  You can also use Benadryl over the counter for itching or also to help with sleep.  ° °TED HOSE STOCKINGS:  Use stockings on both legs until for at least 2 weeks or as   directed by physician office. They may be removed at night for sleeping. ° °MEDICATIONS:  See your medication summary on the “After Visit Summary” that nursing will review with you.  You may have some home medications which will be placed on hold until you complete the course of blood thinner medication.  It is important for you to complete the blood thinner medication as prescribed. ° °PRECAUTIONS:  If you experience chest pain or shortness of breath - call 911 immediately for transfer to the hospital emergency department.  ° °If you develop a fever greater that 101 F, purulent drainage from wound, increased redness or drainage from wound, foul odor from the wound/dressing, or calf pain - CONTACT YOUR SURGEON.   °                                                °FOLLOW-UP APPOINTMENTS:  If you do not already have a post-op appointment, please call the office for an appointment to be seen by your surgeon.  Guidelines for how soon to be seen are listed in your “After Visit Summary”, but are typically between 1-4 weeks after surgery. ° °OTHER INSTRUCTIONS:  ° °Knee Replacement:  Do not place pillow under knee, focus on keeping the knee straight while resting. CPM  instructions: 0-90 degrees, 2 hours in the morning, 2 hours in the afternoon, and 2 hours in the evening. Place foam block, curve side up under heel at all times except when in CPM or when walking.  DO NOT modify, tear, cut, or change the foam block in any way. ° °MAKE SURE YOU:  °• Understand these instructions.  °• Get help right away if you are not doing well or get worse.  ° ° °Thank you for letting us be a part of your medical care team.  It is a privilege we respect greatly.  We hope these instructions will help you stay on track for a fast and full recovery!  ° ° °Information on my medicine - ELIQUIS® (apixaban) ° °Why was Eliquis® prescribed for you? °Eliquis® was prescribed for you to reduce the risk of a blood clot forming that can cause a stroke if you have a medical condition called atrial fibrillation (a type of irregular heartbeat). ° °What do You need to know about Eliquis® ? °Take your Eliquis® TWICE DAILY - one tablet in the morning and one tablet in the evening with or without food. If you have difficulty swallowing the tablet whole please discuss with your pharmacist how to take the medication safely. ° °Take Eliquis® exactly as prescribed by your doctor and DO NOT stop taking Eliquis® without talking to the doctor who prescribed the medication.  Stopping may increase your risk of developing a stroke.  Refill your prescription before you run out. ° °After discharge, you should have regular check-up appointments with your healthcare provider that is prescribing your Eliquis®.  In the future your dose may need to be changed if your kidney function or weight changes by a significant amount or as you get older. ° °What do you do if you miss a dose? °If you miss a dose, take it as soon as you remember on the same day and resume taking twice daily.  Do not take more than one dose of ELIQUIS at the same time to make up a missed dose. ° °Important   Safety Information °A possible side effect of Eliquis® is  bleeding. You should call your healthcare provider right away if you experience any of the following: °? Bleeding from an injury or your nose that does not stop. °? Unusual colored urine (red or dark brown) or unusual colored stools (red or black). °? Unusual bruising for unknown reasons. °? A serious fall or if you hit your head (even if there is no bleeding). ° °Some medicines may interact with Eliquis® and might increase your risk of bleeding or clotting while on Eliquis®. To help avoid this, consult your healthcare provider or pharmacist prior to using any new prescription or non-prescription medications, including herbals, vitamins, non-steroidal anti-inflammatory drugs (NSAIDs) and supplements. ° °This website has more information on Eliquis® (apixaban): http://www.eliquis.com/eliquis/home ° ° °

## 2015-10-04 NOTE — Op Note (Signed)
NAMEMarland Kitchen  Molly Taylor, Molly Taylor NO.:  [id number]  MEDICAL RECORD NO.:  [id number]  LOCATION:  5N23C                        FACILITY:  [facility]  PHYSICIAN:  Lockie Pares, M.D.    DATE OF BIRTH:  April 07, 1926  DATE OF PROCEDURE:  10/03/2015 DATE OF DISCHARGE:                              OPERATIVE REPORT   PREOPERATIVE DIAGNOSIS:  Severe osteoarthritis, right knee.  POSTOPERATIVE DIAGNOSIS:  Severe osteoarthritis, right knee.  OPERATION:  Right total knee replacement from Sigma size 3 femur and tibia, 12.5 bearing with 32 mm all-poly patella.  SURGEON:  Lockie Pares, M.D.  ASSISTANT:  Chriss Czar, PA-C.  ANESTHETIC:  Spinal anesthetic, femoral nerve block with local supplementation.  TOURNIQUET TIME:  55 minutes.  DESCRIPTION OF PROCEDURE:  Sterile prep and drape, exsanguination of leg, inflation of tourniquet to 350.  Straight skin incision with a medial parapatellar approach to the knee made.  We identified the distal femur and cut 12 mm resection 5-degree valgus cut, followed by cutting about 3-4 mm below the most diseased medial compartment, about 2-degree posterior slope.  Extension gap measured at 12.5 mm.  The femur was sized to be a size 3, followed by placement of the all-in-one cutting block in the appropriate degree of external rotation cutting the anterior-posterior cuts on the femur as well as the chamfer cuts.  The PCL was released.  Small remnants of menisci were removed and small osteophytes from the posterior aspect of the knee.  Attention directed to the tibia.  Keel hole was cut for size 3 tibia which fit nicely.  The trial base plate was placed with a 12.5 mm bearing.  Box cut was cut for the femur, followed by placement of a size 3 femur with a 12.5 mm bearing.  Patella was cut leaving about 15 mm of native patella for a 32 mm all-poly trial.  She had a slight flexion contracture with varus deformity with restoration of the mechanical axis as  well as full extension.  Excellent range of motion with no tendency for instability of bearing was noted.  Trial components were removed, and the bony surfaces irrigated.  We infiltrated a mixture of Exparel and Marcaine into the capsule as well as subcutaneous tissues.  We did elect to use some antibiotic-impregnated cement.  The patient was MRSA swab positive. Cement was inserted in the doughy state, tibia followed by femur patella, cement was allowed to harden.  Used a trial bearing while the cement hardened.  The trial bearing was removed.  Once the cement was hardened, no excess cement was noted on the posterior aspect the knee.  Tourniquet was released before placement of the final bearing.  No excessive bleeding was noted.  A 12.5 mm bearing was placed followed by closure of the capsule with #1 Ethibond, subcu tissue with 0 Vicryl, skin with stapling device.  Lightly compressive sterile dressing was applied.  Taken to recovery room in stable condition.     Lockie Pares, M.D.     WDC/MEDQ  D:  10/03/2015  T:  10/04/2015  Job:  [id number]

## 2015-10-04 NOTE — Progress Notes (Signed)
Orthopedic Tech Progress Note Patient Details:  Molly Taylor 05-08-1926 [id number]  CPM Right Knee CPM Right Knee: On Right Knee Flexion (Degrees): 0 Right Knee Extension (Degrees): 0 Additional Comments: trapeze bar patient helper   Molly Taylor 10/04/2015, 3:02 PM

## 2015-10-05 LAB — CBC
HEMATOCRIT: 36 % (ref 36.0–46.0)
Hemoglobin: 12.2 g/dL (ref 12.0–15.0)
MCH: 31.4 pg (ref 26.0–34.0)
MCHC: 33.9 g/dL (ref 30.0–36.0)
MCV: 92.5 fL (ref 78.0–100.0)
PLATELETS: 101 10*3/uL — AB (ref 150–400)
RBC: 3.89 MIL/uL (ref 3.87–5.11)
RDW: 14.9 % (ref 11.5–15.5)
WBC: 11 10*3/uL — AB (ref 4.0–10.5)

## 2015-10-05 MED ORDER — APIXABAN 5 MG PO TABS
5.0000 mg | ORAL_TABLET | Freq: Two times a day (BID) | ORAL | Status: DC
  Administered 2015-10-05 – 2015-10-06 (×2): 5 mg via ORAL
  Filled 2015-10-05 (×2): qty 1

## 2015-10-05 MED ORDER — APIXABAN 5 MG PO TABS: 5.0000 mg | ORAL_TABLET | Freq: Two times a day (BID) | ORAL | Status: DC

## 2015-10-05 NOTE — Clinical Social Work Placement (Signed)
   CLINICAL SOCIAL WORK PLACEMENT  NOTE  Date:  10/05/2015  Patient Details  Name: Molly Taylor MRN: [id number] Date of Birth: 04/04/1926  Clinical Social Work is seeking post-discharge placement for this patient at the [place] level of care (*CSW will initial, date and re-position this form in  chart as items are completed):  Yes   Patient/family provided with [place] Work Department's list of facilities offering this level of care within the geographic area requested by the patient (or if unable, by the patient's family).  Yes   Patient/family informed of their freedom to choose among providers that offer the needed level of care, that participate in Medicare, Medicaid or managed care program needed by the patient, have an available bed and are willing to accept the patient.  Yes   Patient/family informed of [facility]'s ownership interest in [facility] and [facility], as well as of the fact that they are under no obligation to receive care at these facilities.  PASRR submitted to EDS on       PASRR number received on       Existing PASRR number confirmed on 10/05/15     FL2 transmitted to all facilities in geographic area requested by pt/family on 10/05/15     FL2 transmitted to all facilities within larger geographic area on       Patient informed that his/her managed care company has contracts with or will negotiate with certain facilities, including the following:            Patient/family informed of bed offers received.  Patient chooses bed at       Physician recommends and patient chooses bed at      Patient to be transferred to   on  .  Patient to be transferred to facility by       Patient family notified on   of transfer.  Name of family member notified:        PHYSICIAN Please prepare priority discharge summary, including medications, Please prepare prescriptions, Please sign FL2     Additional Comment:     _______________________________________________ Junie Spencer, LCSW 10/05/2015, 4:24 PM

## 2015-10-05 NOTE — Discharge Summary (Signed)
PATIENT ID: Molly Taylor        MRN:  [id number]          DOB/AGE: 1926/03/28 / 82 y.o.    DISCHARGE SUMMARY  ADMISSION DATE:    10/03/2015 DISCHARGE DATE:   10/06/15  ADMISSION DIAGNOSIS: OA RIGHT KNEE    DISCHARGE DIAGNOSIS:  OA RIGHT KNEE    ADDITIONAL DIAGNOSIS: Active Problems:   Primary localized osteoarthritis of right knee  Past Medical History  Diagnosis Date  . Allergic rhinitis, cause unspecified   . Hypercholesteremia     pt. denies  . Hemorrhage of rectum and anus   . Generalized osteoarthrosis, unspecified site   . Hypertonicity of bladder   . Chronic anticoagulation   . Pancreatitis, gallstone   . A-fib ([place])   . Tachy-brady syndrome ([place])     s/p PPM  . Wears glasses     reading  . Wears partial dentures     upper partial  . PONV (postoperative nausea and vomiting)   . Family history of adverse reaction to anesthesia     all children - PONV  . HTN (hypertension)   . Urinary incontinence   . GERD (gastroesophageal reflux disease)     pt. denies  . Presence of permanent cardiac pacemaker   . History of bronchitis   . Renal cell cancer ([place])     pt. denies    PROCEDURE: Procedure(s): RIGHT TOTAL KNEE ARTHROPLASTYrighton 10/03/2015  CONSULTS: pt/ot/sw      HISTORY:  See H&P in chart  HOSPITAL COURSE:  Molly Taylor is a 82 y.o. admitted on 10/03/2015 and found to have a diagnosis of OA RIGHT KNEE.  After appropriate laboratory studies were obtained  they were taken to the operating room on 10/03/2015 and underwent  Procedure(s): RIGHT TOTAL KNEE ARTHROPLASTY  Right.   They were given perioperative antibiotics:  Anti-infectives    Start     Dose/Rate Route Frequency Ordered Stop   10/03/15 2330  vancomycin (VANCOCIN) IVPB 1000 mg/200 mL premix     1,000 mg 200 mL/hr over 60 Minutes Intravenous Every 12 hours 10/03/15 1515 10/04/15 0132   10/03/15 1315  ceFAZolin (ANCEF) IVPB 1 g/50 mL premix  Status:  Discontinued     1 g 100 mL/hr  over 30 Minutes Intravenous Every 6 hours 10/03/15 1306 10/03/15 1314   10/03/15 1145  vancomycin (VANCOCIN) IVPB 1000 mg/200 mL premix  Status:  Discontinued     1,000 mg 200 mL/hr over 60 Minutes Intravenous To ShortStay Surgical 10/03/15 1145 10/03/15 1450   10/03/15 1130  vancomycin (VANCOCIN) 1,000 mg in sodium chloride 0.9 % 250 mL IVPB  Status:  Discontinued     1,000 mg 250 mL/hr over 60 Minutes Intravenous  Once 10/03/15 1121 10/03/15 1140   10/03/15 1000  ceFAZolin (ANCEF) IVPB 2g/100 mL premix     2 g 200 mL/hr over 30 Minutes Intravenous To ShortStay Surgical 10/02/15 1411 10/03/15 1106    .  Tolerated the procedure well.  Placed with a foley intraoperatively.      POD #1, allowed out of bed to a chair.  PT for ambulation and exercise program.  Foley D/C'd in morning.  IV saline locked.  O2 discontionued.  POD #2, continued PT and ambulation.   The remainder of the hospital course was dedicated to ambulation and strengthening.   The patient was discharged on 3 days post op in  Stable condition.  Blood products given:none  DIAGNOSTIC STUDIES:  Recent vital signs: Patient Vitals for the past 24 hrs:  BP Temp Temp src Pulse Resp SpO2  10/05/15 0428 130/62 mmHg 99.3 F (37.4 C) Oral 63 16 92 %  10/04/15 2009 125/60 mmHg 99.4 F (37.4 C) Oral 68 16 90 %  10/04/15 1518 (!) 148/74 mmHg 99.2 F (37.3 C) Oral 67 18 95 %       Recent laboratory studies:  Recent Labs  10/04/15 0550 10/05/15 0842  WBC 10.2 11.0*  HGB 12.2 12.2  HCT 36.3 36.0  PLT 103* 101*    Recent Labs  10/04/15 0550  NA 140  K 4.2  CL 107  CO2 25  BUN 12  CREATININE 0.67  GLUCOSE 132*  CALCIUM 9.1   Lab Results  Component Value Date   INR 1.30 10/03/2015   INR 2.4 09/25/2015   INR 2.3 09/12/2015     Recent Radiographic Studies :  Dg Chest 2 View  09/22/2015  CLINICAL DATA:  Preoperative evaluation for knee surgery, initial encounter EXAM: CHEST  2 VIEW COMPARISON:  04/16/2013  FINDINGS: Cardiac shadow is mildly enlarged but stable. A pacing device is again seen and stable. The lungs are clear bilaterally. Minimal scarring is noted posteriorly. No acute bony abnormality is noted. IMPRESSION: No active cardiopulmonary disease. Electronically Signed   By: Inez Catalina M.D.   On: 09/22/2015 13:05   Ct Angio Chest Aorta W &/or Wo Contrast  10/02/2015  CLINICAL DATA:  Aortic regurgitation with dilated aortic root. Enlarged aorta on echo. EXAM: CT ANGIOGRAPHY CHEST WITH CONTRAST TECHNIQUE: Multidetector CT imaging of the chest was performed using the standard protocol during bolus administration of intravenous contrast. Multiplanar CT image reconstructions and MIPs were obtained to evaluate the vascular anatomy. CONTRAST:  Contrast 100 cc Isovue 370 COMPARISON:  No comparison studies available. FINDINGS: Mediastinum / Lymph Nodes: Study is nongated, creating some motion artifact at the level of the proximal aorta. Double oblique imaging in [employer] through the sinuses of Valsalva reveals transverse orthogonal aortic diameter of 4.1 x 4.2 mm. Measuring at the level of the sino-tubular junction, transaxial orthogonal measurements through the ascending aorta are 3.7 x 4.0 cm. At the level of the mid ascending aorta, transverse orthogonal diameters are 4.3 x 4.2 cm. Heart is enlarged. Coronary artery calcification is noted. No pericardial effusion. There is no axillary lymphadenopathy. No mediastinal lymphadenopathy. There is no hilar lymphadenopathy. The esophagus has normal imaging features. Lungs / Pleura: No focal airspace consolidation. No suspicious pulmonary nodule or mass. No pulmonary edema or pleural effusion. Subsegmental atelectasis/scarring noted in the lung bases. Upper Abdomen: 1.4 cm low-density lesion in the left liver cannot be fully characterized but may be a cyst. A smaller similar 7 mm lesion is seen in the right liver. MSK / Soft Tissues: Bone windows reveal no  worrisome lytic or sclerotic osseous lesions. Review of the MIP images confirms the above findings. IMPRESSION: 1. Measurements of the ascending aorta, as outlined above. Maximum diameter of the ascending segments 4.3 cm. Recommend annual imaging followup by CTA or MRA. This recommendation follows 2010 ACCF/AHA/AATS/ACR/ASA/SCA/SCAI/SIR/STS/SVM Guidelines for the Diagnosis and Management of Patients with Thoracic Aortic Disease. Circulation. 2010; [id number] Electronically Signed   By: Misty Stanley M.D.   On: 10/02/2015 16:14    DISCHARGE INSTRUCTIONS:   DISCHARGE MEDICATIONS:     Medication List    STOP taking these medications        mupirocin nasal ointment 2 %  Commonly known as:  [employer]  warfarin 6 MG tablet  Commonly known as:  COUMADIN      TAKE these medications        acetaminophen 650 MG CR tablet  Commonly known as:  TYLENOL  Take 650 mg by mouth every 8 (eight) hours as needed for pain.     amiodarone 200 MG tablet  Commonly known as:  PACERONE  Take 1 tablet (200 mg total) by mouth daily.     apixaban 5 MG Tabs tablet  Commonly known as:  ELIQUIS  Take 1 tablet (5 mg total) by mouth 2 (two) times daily.     COD LIVER OIL PO  Take 1 tablet by mouth daily.     conjugated estrogens vaginal cream  Commonly known as:  PREMARIN  Place 1 Applicatorful vaginally 2 (two) times a week.     diltiazem 240 MG 24 hr capsule  Commonly known as:  CARDIZEM CD  Take 1 capsule by mouth once daily     HYDROcodone-acetaminophen 5-325 MG tablet  Commonly known as:  NORCO  Take 1-2 tablets by mouth every 4 (four) hours as needed for moderate pain.     loratadine 10 MG tablet  Commonly known as:  CLARITIN  Take 10 mg by mouth daily as needed for allergies.     metoprolol succinate 25 MG 24 hr tablet  Commonly known as:  TOPROL-XL  Take 1 tablet (25 mg total) by mouth daily.     multivitamin-iron-minerals-folic acid chewable tablet  Chew 1 tablet by mouth 3  (three) times a week.     promethazine 12.5 MG tablet  Commonly known as:  PHENERGAN  Take 1 tablet (12.5 mg total) by mouth every 6 (six) hours as needed for nausea or vomiting. .     pseudoephedrine-guaifenesin 60-600 MG 12 hr tablet  Commonly known as:  MUCINEX D  Take 1 tablet by mouth 2 (two) times daily as needed for congestion.        FOLLOW UP VISIT:       Follow-up Information    Follow up with CAFFREY JR,W D, MD. Schedule an appointment as soon as possible for a visit in 2 weeks.   Specialty:  Orthopedic Surgery   Contact information:   [place] [address] [phone]       DISPOSITION:   Skilled Nursing Facility/Rehab  CONDITION:  Stable   Josh Samirah Scarpati PA-C  10/05/2015 2:59 PM   Diet: As you were doing prior to hospitalization   Activity: Increase activity slowly as tolerated  No lifting or driving for 6 weeks   Shower: May shower without a dressing on post op day #5, NO SOAKING in tub   Dressing: You may change your dressing on post op day #3.  Then change the dressing daily with sterile 4"x4"s gauze dressing  And TED hose for knees. Use paper tape to hold dressing in place  For hips. You may clean the incision with alcohol prior to redressing.   Weight Bearing: weight bearing as taught in physical therapy. Use a walker or  Crutches as instructed.   To prevent constipation: you may use a stool softener such as -  Colace ( over the counter) 100 mg by mouth twice a day  Drink plenty of fluids ( prune juice may be helpful) and high fiber foods  Miralax ( over the counter) for constipation as needed.   Precautions: If you experience chest pain or shortness of breath - call 911  immediately For transfer to the hospital emergency department!!  If you develop a fever greater that 101 F, purulent drainage from wound, increased redness or drainage from wound, or calf pain -- Call the office   Follow- Up Appointment:  Please call for an appointment to be seen in 2 weeks  [place] - [phone]

## 2015-10-05 NOTE — Clinical Social Work Note (Signed)
Clinical Social Work Assessment  Patient Details  Name: Molly Taylor MRN: [id number] Date of Birth: 04-20-1926  Date of referral:  10/05/15               Reason for consult:  Discharge Planning                Permission sought to share information with:  Case Manager, Facility Sport and exercise psychologist, Family Supports Permission granted to share information::  Yes, Verbal Permission Granted  Name::        Agency::   (SNF)  Relationship::     Contact Information:     Housing/Transportation Living arrangements for the past 2 months:  Single Family Home Source of Information:  Patient, Medical Team, Adult Children Patient Interpreter Needed:  None Criminal Activity/Legal Involvement Pertinent to Current Situation/Hospitalization:  No - Comment as needed Significant Relationships:  Adult Children Lives with:  Self Do you feel safe going back to the place where you live?  No Need for family participation in patient care:  Yes (Comment)  Care giving concerns: Pt and family has no care giving concerns at this time. Pt and family agreeable to SNF for higher level of care.    Social Worker assessment / plan: Holiday representative met with pt and pt family and discussed CSW role with discharge planning. CSW explained PT recommendation for SNF for short term rehab. Pt family stated agreeable to SNF. Pt family also familiar with SNF process. Pt daughters requested pt [facility] for SNF as the pt's husband is living at [facility]   CSW will follow up with bed offers once available.   Employment status:  Retired Forensic scientist:  Medicare PT Recommendations:    Information / Referral to community resources:  [place]  Patient/Family's Response to care: Pt lying in bed with family at bedside during assessment. Pt very calm and quiet during assessment. Pt family appears happy with care pt is receiving at [facility].   Patient/Family's Understanding of and  Emotional Response to Diagnosis, Current Treatment, and Prognosis: Pt and family appear to have an understanding of reason for pt admission into the hospital and with pt care plan.   Emotional Assessment Appearance:  Appears stated age Attitude/Demeanor/Rapport:   (Pleasant ) Affect (typically observed):  Accepting, Pleasant, Calm Orientation:  Oriented to Self, Oriented to Place, Oriented to  Time, Oriented to Situation Alcohol / Substance use:  Not Applicable Psych involvement (Current and /or in the community):  No (Comment)  Discharge Needs  Concerns to be addressed:  Discharge Planning Concerns Readmission within the last 30 days:    Current discharge risk:  None Barriers to Discharge:  Continued Medical Work up   Junie Spencer, LCSW 10/05/2015, 4:07 PM

## 2015-10-05 NOTE — Progress Notes (Signed)
   Assessment: 2 Days Post-Op  S/P Procedure(s) (LRB): RIGHT TOTAL KNEE ARTHROPLASTY (Right) By Dr. French Ana on 10/03/15  Active Problems:   Primary localized osteoarthritis of right knee  AFVSN.   Improving.  Early PT going well.  Not ambulating much yet. Somewhat somnolent yesterday afternoon after dilaudid - not given since.  Improved today.  Plan: Up with therapy  Weight Bearing: Weight Bearing as Tolerated (WBAT) Right leg Dressings: PRN VTE prophylaxis: Eliquis, SCDs, ambulation Dispo: [facility] tomorrow.  Subjective: Pain controlled with PO meds.  Tolerating some solids.  No CP, SOB.    Objective:   VITALS:   Filed Vitals:   10/04/15 0438 10/04/15 1518 10/04/15 2009 10/05/15 0428  BP: 124/53 148/74 125/60 130/62  Pulse: 60 67 68 63  Temp: 99.3 F (37.4 C) 99.2 F (37.3 C) 99.4 F (37.4 C) 99.3 F (37.4 C)  TempSrc: Oral Oral Oral Oral  Resp: 20 18 16 16   Height:      Weight:      SpO2: 86% 95% 90% 92%    Lab Results  Component Value Date   WBC 10.2 10/04/2015   HGB 12.2 10/04/2015   HCT 36.3 10/04/2015   MCV 93.1 10/04/2015   PLT 103* 10/04/2015   BMET    Component Value Date/Time   NA 140 10/04/2015 0550   K 4.2 10/04/2015 0550   CL 107 10/04/2015 0550   CO2 25 10/04/2015 0550   GLUCOSE 132* 10/04/2015 0550   BUN 12 10/04/2015 0550   CREATININE 0.67 10/04/2015 0550   CALCIUM 9.1 10/04/2015 0550   GFRNONAA >60 10/04/2015 0550   GFRAA >60 10/04/2015 0550    Physical Exam General: NAD.  Supine in bed. Resp: No increased WOB ABD soft Neurologically intact MSK Neurovascular intact Sensation intact distally Dorsiflexion/Plantar flexion intact Incision: dressing C/D/I   Prudencio Burly III 10/05/2015, 7:41 AM

## 2015-10-05 NOTE — Progress Notes (Signed)
Orthopedic Tech Progress Note Patient Details:  Molly Taylor May 19, 1926 [id number]  Patient ID: Molly Taylor, female   DOB: [birth year], 82 y.o.   MRN: [id number] Applied cpm 0-55. Had to back down to 55 from 68 due to pain.  Karolee Stamps 10/05/2015, 6:10 AM

## 2015-10-05 NOTE — Progress Notes (Signed)
Physical Therapy Treatment Patient Details Name: Molly Taylor MRN: [id number] DOB: 11-14-1926 Today's Date: 10/05/2015    History of Present Illness Pt had R TKA on 10/03/15.; difficulty mobilizing; PMH: Pacemaker, thuimb arthrosocpy, A-fib    PT Comments    Continuing work on mobility and walking, and noting slow, but present improvements; Needs more work on standing posture; tends to stand with hips flexed, making taking steps more difficult; As her pain is more under control, I anticipate good progress    Follow Up Recommendations  SNF (her age 82 qualify her for CIR)     Equipment Recommendations  Rolling walker with 5" wheels;3in1 (PT)    Recommendations for Other Services Rehab consult     Precautions / Restrictions Precautions Precautions: Knee;Fall Precaution Comments: Pt educated to not allow any pillow or bolster under knee for healing with optimal range of motion.  Required Braces or Orthoses: Knee Immobilizer - Right Restrictions RLE Weight Bearing: Weight bearing as tolerated    Mobility  Bed Mobility Overal bed mobility: Needs Assistance Bed Mobility: Supine to Sit     Supine to sit: Min assist     General bed mobility comments: Mina to get right lower extremity out of the bed. Min a to sit up at the edge of the bed.   Transfers Overall transfer level: Needs assistance Equipment used: Rolling walker (2 wheeled) Transfers: Sit to/from Stand Sit to Stand: Mod assist;+2 safety/equipment         General transfer comment: Cues for hand placement and technique; Needing heavy mod assist to rise; tends to keep hips flexed; multimodal cues for upright posture  Ambulation/Gait Ambulation/Gait assistance: +2 safety/equipment;Mod assist Ambulation Distance (Feet): 6 Feet Assistive device: Rolling walker (2 wheeled) Gait Pattern/deviations: Decreased stance time - right;Decreased step length - left;Trunk flexed     General Gait Details: Near constant  cues for gait sequence and posture; mod assist as well to advance RLE and to suport as pt stepped LLE; family assited with pushing chair behind pt   Stairs            Wheelchair Mobility    Modified Rankin (Stroke Patients Only)       Balance     Sitting balance-Leahy Scale: Fair       Standing balance-Leahy Scale: Poor                      Cognition Arousal/Alertness: Awake/alert Behavior During Therapy: WFL for tasks assessed/performed Overall Cognitive Status: Within Functional Limits for tasks assessed                      Exercises Total Joint Exercises Ankle Circles/Pumps: AROM;Both;10 reps Quad Sets: AROM;Right;10 reps Heel Slides: AAROM;Right;5 reps Straight Leg Raises: AAROM;Right;10 reps Goniometric ROM: approx 0-55    General Comments        Pertinent Vitals/Pain Pain Assessment: Faces Faces Pain Scale: Hurts whole lot Pain Location: Right knee at end range flexion and extension Pain Descriptors / Indicators: Aching;Grimacing;Guarding Pain Intervention(s): Monitored during session;Premedicated before session;Repositioned    Home Living                      Prior Function            PT Goals (current goals can now be found in the care plan section) Acute Rehab PT Goals Patient Stated Goal: To get stronger  PT Goal Formulation: With patient Time For Goal Achievement: 10/18/15  Potential to Achieve Goals: Good Progress towards PT goals: Progressing toward goals    Frequency  7X/week    PT Plan Current plan remains appropriate    Co-evaluation             End of Session Equipment Utilized During Treatment: Gait belt;Right knee immobilizer Activity Tolerance: Patient tolerated treatment well;Patient limited by pain Patient left: in chair;with call bell/phone within reach;with family/visitor present     Time: [id number] PT Time Calculation (min) (ACUTE ONLY): 38 min  Charges:  $Gait Training: 8-22  mins $Therapeutic Exercise: 8-22 mins $Therapeutic Activity: 8-22 mins                    G Codes:      Quin Hoop 10/05/2015, 4:09 PM  Roney Marion, [place] Pager [phone] Office [phone]

## 2015-10-05 NOTE — NC FL2 (Signed)
[place] LEVEL OF CARE SCREENING TOOL     IDENTIFICATION  Patient Name: Molly Taylor Birthdate: 1927-01-26 Sex: female Admission Date (Current Location): 10/03/2015  [facility] [place] Number:  Herbalist and Address:  [facility] [place] [place] [address], [place], [place] [address]      Provider Number: [id number]  Attending Physician Name and Address:  Earlie Server, MD  Relative Name and Phone Number:       Current Level of Care: Hospital Recommended Level of Care: [place] Prior Approval Number:    Date Approved/Denied:   PASRR Number:   [id number]   Discharge Plan: SNF    Current Diagnoses: Patient Active Problem List   Diagnosis Date Noted  . Primary localized osteoarthritis of right knee 10/03/2015  . Pacemaker 04/24/2013  . SOB (shortness of breath) 04/16/2013  . Encounter for therapeutic drug monitoring 04/12/2013  . Tachy-brady syndrome ([place]) 02/02/2013  . HTN (hypertension)   . Chronic anticoagulation   . Pancreatitis, gallstone   . Atrial fibrillation ([place]) 01/18/2013    Orientation RESPIRATION BLADDER Height & Weight     Self, Time, Situation, Place  O2 (4l/min) Continent Weight: 161 lb 3.2 oz (73.12 kg) Height:  5\' 6"  (167.6 cm)  BEHAVIORAL SYMPTOMS/MOOD NEUROLOGICAL BOWEL NUTRITION STATUS   (None)  (None) Continent Diet (regular)  AMBULATORY STATUS COMMUNICATION OF NEEDS Skin   Extensive Assist Verbally Surgical wounds                       Personal Care Assistance Level of Assistance  Bathing, Feeding, Dressing Bathing Assistance: Limited assistance Feeding assistance: Independent Dressing Assistance: Limited assistance     Functional Limitations Info  Sight, Hearing, Speech Sight Info: Adequate Hearing Info: Adequate Speech Info: Adequate    SPECIAL CARE FACTORS FREQUENCY  PT (By licensed PT)     PT Frequency:  (7x/week)              Contractures  Contractures Info: Not present    Additional Factors Info  Code Status, Allergies Code Status Info:  (full) Allergies Info:  (Sulfa Antibiotics, Codeine)           Current Medications (10/05/2015):  This is the current hospital active medication list Current Facility-Administered Medications  Medication Dose Route Frequency Provider Last Rate Last Dose  . acetaminophen (TYLENOL) tablet 650 mg  650 mg Oral Q6H PRN Chriss Czar, PA-C       Or  . acetaminophen (TYLENOL) suppository 650 mg  650 mg Rectal Q6H PRN Chriss Czar, PA-C      . amiodarone (PACERONE) tablet 200 mg  200 mg Oral Daily Joshua Chadwell, PA-C   200 mg at 10/05/15 0900  . apixaban (ELIQUIS) tablet 5 mg  5 mg Oral BID Ninetta Lights, MD      . bisacodyl (DULCOLAX) EC tablet 5 mg  5 mg Oral Daily PRN Chriss Czar, PA-C   5 mg at 10/05/15 0858  . diltiazem (CARDIZEM CD) 24 hr capsule 240 mg  240 mg Oral Daily Joshua Chadwell, PA-C   240 mg at 10/05/15 0901  . docusate sodium (COLACE) capsule 100 mg  100 mg Oral BID Joshua Chadwell, PA-C   100 mg at 10/05/15 0900  . HYDROcodone-acetaminophen (NORCO/VICODIN) 5-325 MG per tablet 1-2 tablet  1-2 tablet Oral Q4H PRN Chriss Czar, PA-C   1 tablet at 10/05/15 1432  . lactated ringers infusion   Intravenous Continuous [place] Gave  Ermalene Postin, MD 50 mL/hr at 10/03/15 [phone]    . menthol-cetylpyridinium (CEPACOL) lozenge 3 mg  1 lozenge Oral PRN Chriss Czar, PA-C       Or  . phenol (CHLORASEPTIC) mouth spray 1 spray  1 spray Mouth/Throat PRN Joshua Chadwell, PA-C      . metoCLOPramide (REGLAN) tablet 5-10 mg  5-10 mg Oral Q8H PRN Joshua Chadwell, PA-C       Or  . metoCLOPramide (REGLAN) injection 5-10 mg  5-10 mg Intravenous Q8H PRN Joshua Chadwell, PA-C   10 mg at 10/03/15 1547  . metoprolol succinate (TOPROL-XL) 24 hr tablet 25 mg  25 mg Oral Daily Joshua Chadwell, PA-C   25 mg at 10/05/15 0900  . morphine 2 MG/ML injection 1 mg  1 mg Intravenous Q2H PRN Earlie Server, MD    1 mg at 10/05/15 1559  . ondansetron (ZOFRAN) tablet 4 mg  4 mg Oral Q6H Joshua Chadwell, PA-C   4 mg at 10/05/15 1432   Or  . ondansetron (ZOFRAN) injection 4 mg  4 mg Intravenous Q6H Joshua Chadwell, PA-C   4 mg at 10/05/15 0858  . polyethylene glycol (MIRALAX / GLYCOLAX) packet 17 g  17 g Oral Daily PRN Chriss Czar, PA-C      . sodium phosphate (FLEET) 7-19 GM/118ML enema 1 enema  1 enema Rectal Once PRN Chriss Czar, PA-C         Discharge Medications: Please see discharge summary for a list of discharge medications.  Relevant Imaging Results:  Relevant Lab Results:   Additional Information [id number]  Junie Spencer, LCSW

## 2015-10-05 NOTE — Progress Notes (Signed)
Rehab Admissions Coordinator Note:  Patient was screened by Cleatrice Burke for appropriateness for an Inpatient Acute Rehab Consul per PT recommendation. .  At this time, we are recommending [facility]. Family requesting [facility] SNF where pt's spouse is.  Cleatrice Burke 10/05/2015, 4:38 PM  I can be reached at [phone].

## 2015-10-05 NOTE — Progress Notes (Signed)
Orthopedic Tech Progress Note Patient Details:  Molly Taylor [birth year] [id number]  CPM Right Knee CPM Right Knee: On Right Knee Flexion (Degrees): 55 Right Knee Extension (Degrees): 0 Additional Comments: Therapy tried to get patient to tolerate more but the paitient unable. Nursing will bump patient up as tolerated throught her time on the [place] 10/05/2015, 3:46 PM

## 2015-10-06 ENCOUNTER — Encounter (HOSPITAL_COMMUNITY): Payer: Self-pay | Admitting: Orthopedic Surgery

## 2015-10-06 DIAGNOSIS — R278 Other lack of coordination: Secondary | ICD-10-CM | POA: Diagnosis not present

## 2015-10-06 DIAGNOSIS — Z471 Aftercare following joint replacement surgery: Secondary | ICD-10-CM | POA: Diagnosis not present

## 2015-10-06 DIAGNOSIS — R600 Localized edema: Secondary | ICD-10-CM | POA: Diagnosis not present

## 2015-10-06 DIAGNOSIS — R350 Frequency of micturition: Secondary | ICD-10-CM | POA: Diagnosis not present

## 2015-10-06 DIAGNOSIS — R269 Unspecified abnormalities of gait and mobility: Secondary | ICD-10-CM | POA: Diagnosis not present

## 2015-10-06 DIAGNOSIS — Z96651 Presence of right artificial knee joint: Secondary | ICD-10-CM | POA: Diagnosis not present

## 2015-10-06 DIAGNOSIS — R2681 Unsteadiness on feet: Secondary | ICD-10-CM | POA: Diagnosis not present

## 2015-10-06 DIAGNOSIS — M6281 Muscle weakness (generalized): Secondary | ICD-10-CM | POA: Diagnosis not present

## 2015-10-06 DIAGNOSIS — R7981 Abnormal blood-gas level: Secondary | ICD-10-CM | POA: Diagnosis not present

## 2015-10-06 DIAGNOSIS — D62 Acute posthemorrhagic anemia: Secondary | ICD-10-CM | POA: Diagnosis not present

## 2015-10-06 DIAGNOSIS — R0902 Hypoxemia: Secondary | ICD-10-CM | POA: Diagnosis not present

## 2015-10-06 DIAGNOSIS — I482 Chronic atrial fibrillation: Secondary | ICD-10-CM | POA: Diagnosis not present

## 2015-10-06 DIAGNOSIS — I10 Essential (primary) hypertension: Secondary | ICD-10-CM | POA: Diagnosis not present

## 2015-10-06 DIAGNOSIS — M1711 Unilateral primary osteoarthritis, right knee: Secondary | ICD-10-CM | POA: Diagnosis not present

## 2015-10-06 DIAGNOSIS — M25561 Pain in right knee: Secondary | ICD-10-CM | POA: Diagnosis not present

## 2015-10-06 LAB — CBC
HEMATOCRIT: 32.5 % — AB (ref 36.0–46.0)
HEMOGLOBIN: 10.8 g/dL — AB (ref 12.0–15.0)
MCH: 30.9 pg (ref 26.0–34.0)
MCHC: 33.2 g/dL (ref 30.0–36.0)
MCV: 92.9 fL (ref 78.0–100.0)
Platelets: 95 10*3/uL — ABNORMAL LOW (ref 150–400)
RBC: 3.5 MIL/uL — AB (ref 3.87–5.11)
RDW: 15 % (ref 11.5–15.5)
WBC: 8.2 10*3/uL (ref 4.0–10.5)

## 2015-10-06 NOTE — Anesthesia Postprocedure Evaluation (Signed)
Anesthesia Post Note  Patient: Molly Taylor  Procedure(s) Performed: Procedure(s) (LRB): RIGHT TOTAL KNEE ARTHROPLASTY (Right)  Patient location during evaluation: PACU Anesthesia Type: Spinal Level of consciousness: awake Pain management: pain level controlled Vital Signs Assessment: post-procedure vital signs reviewed and stable Respiratory status: spontaneous breathing Cardiovascular status: stable Postop Assessment: no signs of nausea or vomiting Anesthetic complications: no    Last Vitals:  Filed Vitals:   10/06/15 0456 10/06/15 1041  BP: 123/59 111/56  Pulse: 66 65  Temp: 37 C 37.1 C  Resp: 16 16    Last Pain:  Filed Vitals:   10/06/15 1327  PainSc: Asleep                 Erion Hermans

## 2015-10-06 NOTE — Progress Notes (Signed)
Physical Therapy Treatment Patient Details Name: Molly Taylor MRN: [id number] DOB: 01/13/1927 Today's Date: 10/06/2015    History of Present Illness Pt had R TKA on 10/03/15.; difficulty mobilizing; PMH: Pacemaker, thuimb arthrosocpy, A-fib    PT Comments    Pt tolerated treatment session well today; however, still requiring physical assistance with bed mobility, transfers and ambulation. Pt's daughters were present during session and stated that pt is scheduled to d/c to SNF today. Pt would continue to benefit from skilled physical therapy services at this time while admitted and after d/c.    Follow Up Recommendations  SNF     Equipment Recommendations  Rolling walker with 5" wheels;3in1 (PT)    Recommendations for Other Services       Precautions / Restrictions Precautions Precautions: Knee;Fall Required Braces or Orthoses: Knee Immobilizer - Right Restrictions Weight Bearing Restrictions: Yes RLE Weight Bearing: Weight bearing as tolerated    Mobility  Bed Mobility Overal bed mobility: Needs Assistance Bed Mobility: Supine to Sit     Supine to sit: Min assist     General bed mobility comments: Min A to get right lower extremity out of the bed. Min A to sit up at the edge of the bed.   Transfers Overall transfer level: Needs assistance Equipment used: Rolling walker (2 wheeled) Transfers: Sit to/from [facility] Sit to Stand: Mod assist;+2 safety/equipment Stand pivot transfers: Mod assist;+2 safety/equipment       General transfer comment: Cues for hand placement and technique; Needing heavy mod assist to rise; tends to keep hips flexed; multimodal cues for upright posture  Ambulation/Gait Ambulation/Gait assistance: +2 safety/equipment;Mod assist Ambulation Distance (Feet): 1 Feet Assistive device: Rolling walker (2 wheeled) Gait Pattern/deviations: Decreased step length - left;Decreased stance time - right;Decreased weight shift to  right;Trunk flexed Gait velocity: decreased Gait velocity interpretation: Below normal speed for age/gender General Gait Details: Pt needed constant cues for gait sequence and posture   Stairs            Wheelchair Mobility    Modified Rankin (Stroke Patients Only)       Balance Overall balance assessment: Needs assistance Sitting-balance support: Feet supported;No upper extremity supported Sitting balance-Leahy Scale: Fair     Standing balance support: Bilateral upper extremity supported Standing balance-Leahy Scale: Poor                      Cognition Arousal/Alertness: Awake/alert Behavior During Therapy: WFL for tasks assessed/performed Overall Cognitive Status: Within Functional Limits for tasks assessed                      Exercises Total Joint Exercises Ankle Circles/Pumps: AROM;Right;Strengthening;10 reps Quad Sets: AROM;Strengthening;Right;5 reps (5 reps x2 with resting break in between sets) Heel Slides: AAROM;Right;5 reps (5 reps x2 with resting break in between sets) Hip ABduction/ADduction: AAROM;Right;10 reps    General Comments        Pertinent Vitals/Pain Pain Assessment: No/denies pain    Home Living                      Prior Function            PT Goals (current goals can now be found in the care plan section) Acute Rehab PT Goals Patient Stated Goal: To get stronger  PT Goal Formulation: With patient Time For Goal Achievement: 10/18/15 Potential to Achieve Goals: Good Progress towards PT goals: Progressing toward goals  Frequency  7X/week    PT Plan Current plan remains appropriate    Co-evaluation             End of Session Equipment Utilized During Treatment: Gait belt;Right knee immobilizer Activity Tolerance: Patient tolerated treatment well;Patient limited by pain Patient left: in chair;with call bell/phone within reach;with family/visitor present     Time: [id number] PT Time  Calculation (min) (ACUTE ONLY): 31 min  Charges:  $Therapeutic Exercise: 8-22 mins $Therapeutic Activity: 8-22 mins                    G CodesClearnce Sorrel Kirby Argueta 2015-10-20, 12:26 PM Sherie Don, [place], DPT [phone]

## 2015-10-06 NOTE — Progress Notes (Signed)
Pt discharge education and instructions completed. Report called off to nurse Monique at [facility]. Pt discharge to [facility] with [facility] to transport her to disposition. Pt IV removed; ace compression dsg remains clean, dry and intact to RLE; Knee Immobilizer remains in place to RLE. Pt picked up by [facility] to be transported off to disposition. Pt transported off unit via stretcher with daughters and belongings to the side. Delia Heady RN

## 2015-10-06 NOTE — Discharge Planning (Signed)
Patient seen on rounds this am. She is sleeping. Plan is for her to discharge to [facility] re[facility]. Her husband is a resident there. Patient and family are agreeable to plan. She will likely need ambulance transport and she is walking very short distances.  CM will follow at facility   Ladell Heads, [place]  [facility]  [phone]

## 2015-10-06 NOTE — Progress Notes (Addendum)
CSW called [facility] at 2pm  Patient will DC to: [facility] Anticipated DC date: 10/06/15 Family notified: [place] Transport by: Corey Harold -after pt works with physical therapy   Per MD patient ready for DC to [place]. RN, patient, patient's family, and facility notified of DC. RN given number for report. DC packet on chart. Ambulance transport requested for patient.   CSW signing off.  Cedric Fishman, [place] Social Worker [phone]

## 2015-10-06 NOTE — Progress Notes (Signed)
Orthopedic Tech Progress Note Patient Details:  Molly Taylor Apr 30, 1926 [id number]  Patient ID: Molly Taylor, female   DOB: 1926/08/07, 82 y.o.   MRN: [id number] Pt already in cpm.  Karolee Stamps 10/06/2015, 5:50 AM

## 2015-10-06 NOTE — Clinical Social Work Placement (Signed)
   CLINICAL SOCIAL WORK PLACEMENT  NOTE  Date:  10/06/2015  Patient Details  Name: Molly Taylor MRN: [id number] Date of Birth: 10-Mar-1927  Clinical Social Work is seeking post-discharge placement for this patient at the [place] level of care (*CSW will initial, date and re-position this form in  chart as items are completed):  Yes   Patient/family provided with [place] Work Department's list of facilities offering this level of care within the geographic area requested by the patient (or if unable, by the patient's family).  Yes   Patient/family informed of their freedom to choose among providers that offer the needed level of care, that participate in Medicare, Medicaid or managed care program needed by the patient, have an available bed and are willing to accept the patient.  Yes   Patient/family informed of [facility]'s ownership interest in [facility] and [facility], as well as of the fact that they are under no obligation to receive care at these facilities.  PASRR submitted to EDS on       PASRR number received on       Existing PASRR number confirmed on 10/05/15     FL2 transmitted to all facilities in geographic area requested by pt/family on 10/05/15     FL2 transmitted to all facilities within larger geographic area on       Patient informed that his/her managed care company has contracts with or will negotiate with certain facilities, including the following:        Yes   Patient/family informed of bed offers received.  Patient chooses bed at [facility]     Physician recommends and patient chooses bed at      Patient to be transferred to [facility] on 10/06/15.  Patient to be transferred to facility by [facility]     Patient family notified on 10/06/15 of transfer.  Name of family member notified:  Patient to call family     PHYSICIAN       Additional Comment:     _______________________________________________ Benard Halsted, [place] 10/06/2015, 9:04 AM

## 2015-10-09 ENCOUNTER — Telehealth: Payer: Self-pay | Admitting: Cardiology

## 2015-10-09 ENCOUNTER — Encounter: Payer: Self-pay | Admitting: Family

## 2015-10-09 ENCOUNTER — Non-Acute Institutional Stay (SKILLED_NURSING_FACILITY): Payer: Medicare Other | Admitting: Family

## 2015-10-09 DIAGNOSIS — R269 Unspecified abnormalities of gait and mobility: Secondary | ICD-10-CM

## 2015-10-09 DIAGNOSIS — M25561 Pain in right knee: Secondary | ICD-10-CM

## 2015-10-09 DIAGNOSIS — I482 Chronic atrial fibrillation, unspecified: Secondary | ICD-10-CM

## 2015-10-09 DIAGNOSIS — R7981 Abnormal blood-gas level: Secondary | ICD-10-CM | POA: Diagnosis not present

## 2015-10-09 NOTE — Telephone Encounter (Signed)
Informed patient of results and verbal understanding expressed.  Repeat ECHO is already ordered to be scheduled in one year. Patient agrees with treatment plan.

## 2015-10-09 NOTE — Progress Notes (Signed)
Patient ID: Molly Taylor, female   DOB: 1926/09/28, 82 y.o.   MRN: [id number]  Location:    [facility] Room Number: 902-P Place of Service:  SNF [phone]) Provider: Dinah Ngetich FNP-C   Donnie Coffin, MD  Patient Care Team: L.Donnie Coffin, MD as PCP - General (Family Medicine)  Extended Emergency Contact Information Primary Emergency Contact: Molly Taylor Address: [address]          [place], [place] [address] Molly Taylor of [place] Phone: [phone] Mobile Phone: [phone] Relation: Daughter Secondary Emergency Contact: Taylor,Molly Address: [address]          [place], [place] [address] Molly Taylor of [place] Phone: [phone] Mobile Phone: [phone] Relation: Daughter  Code Status:  Full Code  Goals of care: Advanced Directive information Advanced Directives 09/22/2015  Does patient have an advance directive? Yes  Type of Paramedic of [place];Living will  Does patient want to make changes to advanced directive? No - Patient declined  Copy of advanced directive(s) in chart? No - copy requested     Chief Complaint  Patient presents with  . Acute Visit    Concerns about O2 stats    HPI:  Pt is a 82 y.o. female seen today at [facility] for an acute visit for evaluation of low oxygen saturations. She is here for short term rehabilitation post hospital admission since 10/03/2015-10/06/2015 due to right knee pain secondary to Osteoarthritis. She underwent right knee arthroplasty 10/03/2015. She is seen in her room today. She has worked well with PT/OT while here in the facility until last night when facility nurse noted her oxygen saturation in the 80's on room air. Oxygen 2 liters via nasal cannula was applied and O2 sats went upto 91 %.Her oxygen saturations continues to be 92 -93 % on [place].She states required oxygen post-op at the hospital. She has been using her  incentive Spirometer. She denies any fever, chills, cough,wheezing or shortness of breath.    Past Medical History  Diagnosis Date  . Allergic rhinitis, cause unspecified   . Hypercholesteremia     pt. denies  . Hemorrhage of rectum and anus   . Generalized osteoarthrosis, unspecified site   . Hypertonicity of bladder   . Chronic anticoagulation   . Pancreatitis, gallstone   . A-fib ([place])   . Tachy-brady syndrome ([place])     s/p PPM  . Wears glasses     reading  . Wears partial dentures     upper partial  . PONV (postoperative nausea and vomiting)   . Family history of adverse reaction to anesthesia     all children - PONV  . HTN (hypertension)   . Urinary incontinence   . GERD (gastroesophageal reflux disease)     pt. denies  . Presence of permanent cardiac pacemaker   . History of bronchitis   . Renal cell cancer ([place])     pt. denies   Past Surgical History  Procedure Laterality Date  . Left knee replacement - dr. French Ana  2012    lt total knee  . Total abdominal hysterectomy w/ bilateral salpingoophorectomy    . Pacemaker generator change  04/2013    MDT ADDRL1 pacemaker implanted by Dr Lovena Le  . Laparoscopic cholecystectomy  2007    lap choli  . Tubal ligation    . Thumb arthroscopy  2007    right  . Pacemaker insertion  2007  .  Vein ligation    . Proximal interphalangeal fusion (pip) Right 08/22/2013    Procedure: EXCISION MUCOID CYST DEBRIDEMENT PROXIMAL INTERPHALANGEAL JOINT;  Surgeon: Wynonia Sours, MD;  Location: [facility];  Service: Orthopedics;  Laterality: Right;  . Permanent pacemaker generator change N/A 05/03/2013    Procedure: PERMANENT PACEMAKER GENERATOR CHANGE;  Surgeon: Evans Lance, MD;  Location: [facility] CATH [facility];  Service: Cardiovascular;  Laterality: N/A;  . Abdominal hysterectomy    . Colonoscopy    . Total knee arthroplasty Right 10/03/2015    Procedure: RIGHT TOTAL KNEE ARTHROPLASTY;  Surgeon: Earlie Server, MD;  Location: [facility];   Service: Orthopedics;  Laterality: Right;    Allergies  Allergen Reactions  . Sulfa Antibiotics Swelling  . Codeine Nausea And Vomiting      Medication List       This list is accurate as of: 10/09/15  6:49 PM.  Always use your most recent med list.               acetaminophen 650 MG CR tablet  Commonly known as:  TYLENOL  Take 650 mg by mouth every 8 (eight) hours as needed for pain.     amiodarone 200 MG tablet  Commonly known as:  PACERONE  Take 1 tablet (200 mg total) by mouth daily.     conjugated estrogens vaginal cream  Commonly known as:  PREMARIN  Place 1 Applicatorful vaginally 2 (two) times a week.     diltiazem 240 MG 24 hr capsule  Commonly known as:  CARDIZEM CD  Take 1 capsule by mouth once daily     ELIQUIS 5 MG Tabs tablet  Generic drug:  apixaban  Take 5 mg by mouth daily.     HYDROcodone-acetaminophen 5-325 MG tablet  Commonly known as:  NORCO  Take 1-2 tablets by mouth every 4 (four) hours as needed for moderate pain.     loratadine 10 MG tablet  Commonly known as:  CLARITIN  Take 10 mg by mouth daily as needed for allergies.     metoprolol succinate 25 MG 24 hr tablet  Commonly known as:  TOPROL-XL  Take 1 tablet (25 mg total) by mouth daily.     multivitamin-iron-minerals-folic acid chewable tablet  Chew 1 tablet by mouth 3 (three) times a week.     ondansetron 4 MG tablet  Commonly known as:  ZOFRAN  Take 4 mg by mouth every 6 (six) hours as needed for nausea or vomiting.     pseudoephedrine-guaifenesin 60-600 MG 12 hr tablet  Commonly known as:  MUCINEX D  Take 1 tablet by mouth 2 (two) times daily as needed for congestion.        Review of Systems  Constitutional: Negative for fever, chills, activity change, appetite change and fatigue.  HENT: Negative for congestion, rhinorrhea, sinus pressure, sneezing and sore throat.   Eyes: Negative.   Respiratory: Negative for cough, chest tightness, shortness of breath and wheezing.     Cardiovascular: Negative for chest pain and palpitations.       Left leg swelling   Gastrointestinal: Negative for nausea, vomiting, abdominal pain, diarrhea, constipation and abdominal distention.       LBM 10/09/2015 medium   Genitourinary: Negative for dysuria, urgency, frequency and flank pain.  Musculoskeletal: Positive for gait problem.       Right knee pain   Skin: Negative for color change, pallor and rash.       Right knee surgical drsg  Neurological: Negative for dizziness, seizures, syncope, light-headedness and headaches.  Hematological: Does not bruise/bleed easily.  Psychiatric/Behavioral: Negative for hallucinations, confusion, sleep disturbance and agitation. The patient is not nervous/anxious.     Immunization History  Administered Date(s) Administered  . PPD Test 10/06/2015   Pertinent  Health Maintenance Due  Topic Date Due  . DEXA SCAN  01/21/1994  . PNA vac Low Risk Adult (1 of 2 - PCV13) 01/21/1994  . INFLUENZA VACCINE  10/21/2015   No flowsheet data found. Functional Status Survey:    Filed Vitals:   10/09/15 1415  BP: 102/60  Pulse: 66  Temp: 98.3 F (36.8 C)  TempSrc: Oral  Resp: 18  Height: 5\' 6"  (1.676 m)  Weight: 161 lb 3.2 oz (73.12 kg)  SpO2: 92%   Body mass index is 26.03 kg/(m^2). Physical Exam  Constitutional: She is oriented to person, place, and time. She appears well-developed and well-nourished. No distress.  HENT:  Head: Normocephalic.  Mouth/Throat: Oropharynx is clear and moist. No oropharyngeal exudate.  Eyes: Conjunctivae and EOM are normal. Pupils are equal, round, and reactive to light. Right eye exhibits no discharge. Left eye exhibits no discharge. No scleral icterus.  Neck: Normal range of motion. No JVD present. No thyromegaly present.  Cardiovascular: Intact distal pulses.  Exam reveals no gallop and no friction rub.   No murmur heard. Heart rate irregular   Pulmonary/Chest: Effort normal. No respiratory distress.  She has no wheezes. She has no rales.  Bilateral Clear diminished bases  Abdominal: Soft. Bowel sounds are normal. She exhibits no distension. There is no tenderness. There is no rebound and no guarding.  Musculoskeletal: She exhibits no tenderness.  Right knee limited ROM due to pain. trace edema to right leg   Lymphadenopathy:    She has no cervical adenopathy.  Neurological: She is oriented to person, place, and time.  Skin: Skin is warm and dry. No rash noted. No erythema. No pallor.  Right knee surgical drsg dry with small marked old drainage. Surrounding skin without any signs of infections.   Psychiatric: She has a normal mood and affect.    Labs reviewed:  Recent Labs  09/22/15 1150 10/04/15 0550  NA 141 140  K 4.1 4.2  CL 110 107  CO2 27 25  GLUCOSE 71 132*  BUN 18 12  CREATININE 0.94 0.67  CALCIUM 9.7 9.1    Recent Labs  09/22/15 1150  AST 17  ALT 12*  ALKPHOS 59  BILITOT 0.9  PROT 6.4*  ALBUMIN 3.5    Recent Labs  09/22/15 1150 10/04/15 0550 10/05/15 0842 10/06/15 0335  WBC 5.8 10.2 11.0* 8.2  NEUTROABS 3.7  --   --   --   HGB 14.0 12.2 12.2 10.8*  HCT 42.7 36.3 36.0 32.5*  MCV 95.5 93.1 92.5 92.9  PLT 125* 103* 101* 95*   No results found for: TSH No results found for: HGBA1C No results found for: CHOL, HDL, LDLCALC, LDLDIRECT, TRIG, CHOLHDL  Significant Diagnostic Results in last 30 days:  Dg Chest 2 View  09/22/2015  CLINICAL DATA:  Preoperative evaluation for knee surgery, initial encounter EXAM: CHEST  2 VIEW COMPARISON:  04/16/2013 FINDINGS: Cardiac shadow is mildly enlarged but stable. A pacing device is again seen and stable. The lungs are clear bilaterally. Minimal scarring is noted posteriorly. No acute bony abnormality is noted. IMPRESSION: No active cardiopulmonary disease. Electronically Signed   By: Inez Catalina M.D.   On: 09/22/2015 13:05  Ct Angio Chest Aorta W &/or Wo Contrast  10/02/2015  CLINICAL DATA:  Aortic regurgitation  with dilated aortic root. Enlarged aorta on echo. EXAM: CT ANGIOGRAPHY CHEST WITH CONTRAST TECHNIQUE: Multidetector CT imaging of the chest was performed using the standard protocol during bolus administration of intravenous contrast. Multiplanar CT image reconstructions and MIPs were obtained to evaluate the vascular anatomy. CONTRAST:  Contrast 100 cc Isovue 370 COMPARISON:  No comparison studies available. FINDINGS: Mediastinum / Lymph Nodes: Study is nongated, creating some motion artifact at the level of the proximal aorta. Double oblique imaging in [employer] through the sinuses of Valsalva reveals transverse orthogonal aortic diameter of 4.1 x 4.2 mm. Measuring at the level of the sino-tubular junction, transaxial orthogonal measurements through the ascending aorta are 3.7 x 4.0 cm. At the level of the mid ascending aorta, transverse orthogonal diameters are 4.3 x 4.2 cm. Heart is enlarged. Coronary artery calcification is noted. No pericardial effusion. There is no axillary lymphadenopathy. No mediastinal lymphadenopathy. There is no hilar lymphadenopathy. The esophagus has normal imaging features. Lungs / Pleura: No focal airspace consolidation. No suspicious pulmonary nodule or mass. No pulmonary edema or pleural effusion. Subsegmental atelectasis/scarring noted in the lung bases. Upper Abdomen: 1.4 cm low-density lesion in the left liver cannot be fully characterized but may be a cyst. A smaller similar 7 mm lesion is seen in the right liver. MSK / Soft Tissues: Bone windows reveal no worrisome lytic or sclerotic osseous lesions. Review of the MIP images confirms the above findings. IMPRESSION: 1. Measurements of the ascending aorta, as outlined above. Maximum diameter of the ascending segments 4.3 cm. Recommend annual imaging followup by CTA or MRA. This recommendation follows 2010 ACCF/AHA/AATS/ACR/ASA/SCA/SCAI/SIR/STS/SVM Guidelines for the Diagnosis and Management of Patients with Thoracic  Aortic Disease. Circulation. 2010; [id number] Electronically Signed   By: Misty Stanley M.D.   On: 10/02/2015 16:14    Assessment/Plan 1. Low oxygen saturation Afebrile.O2 sats were in the 80's previous night. Has improved on 2 liters nasal cannula. Bilateral Lung fields CTA with diminished bases. Incentive spirometer every 8 HRS. Obtain Portable CXR Pa/Lat rule out PNA. Monitor vital signs Q shift X 5 days then resume previous orders.   2. Right knee pain S/p right knee Arthroplasty 10/03/2015. Current pain regimen effective. Continue Eliquis 5 mg tablet. Continue with PT/OT for ROM, exercise and muscle strengthening. Follow up with Ortho as directed.   3. Abnormality of gait Continue with PT/OT for ROM, exercise and muscle strengthening.Fall and safety precautions.   4. Chronic atrial fibrillation (HCC) HR irregular. Continue on Diltiazem 240 mg Tablet, metoprolol 25 mg tablet, Amiodarone 200 mg tablet and Eliquis 5 mg tablet. Continue to monitor.     Family/ staff Communication: Reviewed plan of care with patient and facility Nurse supervisor.  Labs/tests ordered:  Portable CXR Pa/Lat rule out PNA.

## 2015-10-09 NOTE — Telephone Encounter (Signed)
New message ° ° ° ° °Returning a call to the nurse °

## 2015-10-09 NOTE — Telephone Encounter (Signed)
-----   Message from Sueanne Margarita, MD sent at 10/03/2015 11:32 AM EDT ----- Chest CT confirms mildly dilated ascending thoracic aneurysm at 4.3cm (4.2cm on echo) so will continue to follow with yearly echo

## 2015-10-13 ENCOUNTER — Non-Acute Institutional Stay (SKILLED_NURSING_FACILITY): Payer: Medicare Other | Admitting: Internal Medicine

## 2015-10-13 ENCOUNTER — Encounter: Payer: Self-pay | Admitting: Internal Medicine

## 2015-10-13 ENCOUNTER — Other Ambulatory Visit: Payer: Self-pay

## 2015-10-13 DIAGNOSIS — I482 Chronic atrial fibrillation, unspecified: Secondary | ICD-10-CM

## 2015-10-13 DIAGNOSIS — R2681 Unsteadiness on feet: Secondary | ICD-10-CM

## 2015-10-13 DIAGNOSIS — R350 Frequency of micturition: Secondary | ICD-10-CM | POA: Diagnosis not present

## 2015-10-13 DIAGNOSIS — M1711 Unilateral primary osteoarthritis, right knee: Secondary | ICD-10-CM

## 2015-10-13 DIAGNOSIS — I10 Essential (primary) hypertension: Secondary | ICD-10-CM | POA: Diagnosis not present

## 2015-10-13 DIAGNOSIS — R0902 Hypoxemia: Secondary | ICD-10-CM | POA: Diagnosis not present

## 2015-10-13 DIAGNOSIS — R600 Localized edema: Secondary | ICD-10-CM

## 2015-10-13 DIAGNOSIS — D62 Acute posthemorrhagic anemia: Secondary | ICD-10-CM

## 2015-10-13 MED ORDER — OXYCODONE-ACETAMINOPHEN 5-325 MG PO TABS: 1.0000 | ORAL_TABLET | ORAL | 0 refills | Status: DC | PRN

## 2015-10-13 NOTE — Progress Notes (Signed)
LOCATION: [facility]  PCP: Donnie Coffin, MD   Code Status: Full Code  Goals of care: Advanced Directive information Advanced Directives 09/22/2015  Does patient have an advance directive? Yes  Type of Paramedic of [place];Living will  Does patient want to make changes to advanced directive? No - Patient declined  Copy of advanced directive(s) in chart? No - copy requested       Extended Emergency Contact Information Primary Emergency Contact: [name] Address: [address]          [place], [place] [address] [name] of [place] Phone: [phone] Mobile Phone: [phone] Relation: Daughter Secondary Emergency Contact: Porter,Vivian Address: [address]          [place],  [address] [name] of [place] Phone: [phone] Mobile Phone: [phone] Relation: Daughter   Allergies  Allergen Reactions  . Sulfa Antibiotics Swelling  . Codeine Nausea And Vomiting    Chief Complaint  Patient presents with  . New Admit To SNF    New Admission     HPI:  Patient is a 82 y.o. female seen today for short term rehabilitation post hospital admission from 10/03/15-10/06/15 with right knee OA. She underwent right total knee arthroplasty. She is seen in her room today with her daughters present. Her pain is under control with current pain regimen. She has muscle spasm. She complaints of increased urinary frequency. She had a U/A with c/s  Sent and this has shown no bacterial growth. She had a Chest xray done with her hypoxia and this is negative for infiltrate.   Review of Systems:  Constitutional: Negative for fever, chills, diaphoresis.  HENT: Negative for headache, congestion, nasal discharge Eyes: Negative for blurred vision, double vision and discharge.  Respiratory: Negative for cough, shortness of breath and wheezing.   Cardiovascular: Negative for chest pain, palpitations.  Gastrointestinal: Negative  for heartburn, nausea, vomiting, abdominal pain. Had bowel movement yesterday Genitourinary: Negative for dysuria and flank pain.  Musculoskeletal: Negative for back pain, fall.  Skin: Negative for itching, rash.  Neurological: Negative for dizziness. Psychiatric/Behavioral: Negative for depression   Past Medical History:  Diagnosis Date  . A-fib ([place])   . Allergic rhinitis, cause unspecified   . Chronic anticoagulation   . Family history of adverse reaction to anesthesia    all children - PONV  . Generalized osteoarthrosis, unspecified site   . GERD (gastroesophageal reflux disease)    pt. denies  . Hemorrhage of rectum and anus   . History of bronchitis   . HTN (hypertension)   . Hypercholesteremia    pt. denies  . Hypertonicity of bladder   . Pancreatitis, gallstone   . PONV (postoperative nausea and vomiting)   . Presence of permanent cardiac pacemaker   . Renal cell cancer ([place])    pt. denies  . Tachy-brady syndrome ([facility])    s/p PPM  . Urinary incontinence   . Wears glasses    reading  . Wears partial dentures    upper partial   Past Surgical History:  Procedure Laterality Date  . ABDOMINAL HYSTERECTOMY    . COLONOSCOPY    . LAPAROSCOPIC CHOLECYSTECTOMY  2007   lap choli  . left knee replacement - Dr. French Ana  2012   lt total knee  . PACEMAKER GENERATOR CHANGE  04/2013   MDT ADDRL1 pacemaker implanted by Dr Lovena Le  . PACEMAKER INSERTION  2007  . PERMANENT PACEMAKER GENERATOR CHANGE N/A 05/03/2013   Procedure:  PERMANENT PACEMAKER GENERATOR CHANGE;  Surgeon: Evans Lance, MD;  Location: [facility] CATH LAB;  Service: Cardiovascular;  Laterality: N/A;  . PROXIMAL INTERPHALANGEAL FUSION (PIP) Right 08/22/2013   Procedure: EXCISION MUCOID CYST DEBRIDEMENT PROXIMAL INTERPHALANGEAL JOINT;  Surgeon: Wynonia Sours, MD;  Location: [facility];  Service: Orthopedics;  Laterality: Right;  . THUMB ARTHROSCOPY  2007   right  . TOTAL ABDOMINAL HYSTERECTOMY W/ BILATERAL  SALPINGOOPHORECTOMY    . TOTAL KNEE ARTHROPLASTY Right 10/03/2015   Procedure: RIGHT TOTAL KNEE ARTHROPLASTY;  Surgeon: Earlie Server, MD;  Location: [facility];  Service: Orthopedics;  Laterality: Right;  . TUBAL LIGATION    . VEIN LIGATION     Social History:   reports that she has never smoked. She does not have any smokeless tobacco history on file. She reports that she does not drink alcohol or use drugs.  No family history on file.  Medications:   Medication List       Accurate as of 10/13/15  2:38 PM. Always use your most recent med list.          acetaminophen 650 MG CR tablet Commonly known as:  TYLENOL Take 650 mg by mouth every 8 (eight) hours as needed for pain.   amiodarone 200 MG tablet Commonly known as:  PACERONE Take 1 tablet (200 mg total) by mouth daily.   conjugated estrogens vaginal cream Commonly known as:  PREMARIN Place 1 Applicatorful vaginally 2 (two) times a week.   diltiazem 240 MG 24 hr capsule Commonly known as:  CARDIZEM CD Take 1 capsule by mouth once daily   ELIQUIS 5 MG Tabs tablet Generic drug:  apixaban Take 5 mg by mouth daily.   HYDROcodone-acetaminophen 5-325 MG tablet Commonly known as:  NORCO Take 1-2 tablets by mouth every 4 (four) hours as needed for moderate pain.   loratadine 10 MG tablet Commonly known as:  CLARITIN Take 10 mg by mouth daily as needed for allergies.   metoprolol succinate 25 MG 24 hr tablet Commonly known as:  TOPROL-XL Take 1 tablet (25 mg total) by mouth daily.   multivitamin-iron-minerals-folic acid chewable tablet Chew 1 tablet by mouth 3 (three) times a week.   ondansetron 4 MG tablet Commonly known as:  ZOFRAN Take 4 mg by mouth every 6 (six) hours as needed for nausea or vomiting.   pseudoephedrine-guaifenesin 60-600 MG 12 hr tablet Commonly known as:  MUCINEX D Take 1 tablet by mouth 2 (two) times daily as needed for congestion.       Immunizations: Immunization History  Administered  Date(s) Administered  . PPD Test 10/06/2015     Physical Exam: Vitals:   10/13/15 1434  BP: 114/66  Pulse: 73  Resp: 16  Temp: 98.2 F (36.8 C)  TempSrc: Oral  SpO2: 93%  Weight: 161 lb 3.2 oz (73.1 kg)  Height: 5\' 6"  (1.676 m)   Body mass index is 26.02 kg/m.  General- elderly female, well built, in no acute distress Head- normocephalic, atraumatic Nose- no nasal discharge Throat- moist mucus membrane Eyes- PERRLA, EOMI, no pallor, no icterus, no discharge, normal conjunctiva, normal sclera Neck- no cervical lymphadenopathy Cardiovascular- normal s1,s2, no murmur, 1+ right leg edema Respiratory- bilateral clear to auscultation, no wheeze, no rhonchi, no crackles, no use of accessory muscles Abdomen- bowel sounds present, soft, non tender Musculoskeletal- able to move all 4 extremities, limited right knee range of motion Neurological- alert and oriented to person, place and time Skin- warm and dry,  right knee surgical incision with staples in place Psychiatry- normal mood and affect    Labs reviewed: Basic Metabolic Panel:  Recent Labs  09/22/15 1150 10/04/15 0550  NA 141 140  K 4.1 4.2  CL 110 107  CO2 27 25  GLUCOSE 71 132*  BUN 18 12  CREATININE 0.94 0.67  CALCIUM 9.7 9.1   Liver Function Tests:  Recent Labs  09/22/15 1150  AST 17  ALT 12*  ALKPHOS 59  BILITOT 0.9  PROT 6.4*  ALBUMIN 3.5   No results for input(s): LIPASE, AMYLASE in the last 8760 hours. No results for input(s): AMMONIA in the last 8760 hours. CBC:  Recent Labs  09/22/15 1150 10/04/15 0550 10/05/15 0842 10/06/15 0335  WBC 5.8 10.2 11.0* 8.2  NEUTROABS 3.7  --   --   --   HGB 14.0 12.2 12.2 10.8*  HCT 42.7 36.3 36.0 32.5*  MCV 95.5 93.1 92.5 92.9  PLT 125* 103* 101* 95*    Radiological Exams: Dg Chest 2 View  Result Date: 09/22/2015 CLINICAL DATA:  Preoperative evaluation for knee surgery, initial encounter EXAM: CHEST  2 VIEW COMPARISON:  04/16/2013 FINDINGS:  Cardiac shadow is mildly enlarged but stable. A pacing device is again seen and stable. The lungs are clear bilaterally. Minimal scarring is noted posteriorly. No acute bony abnormality is noted. IMPRESSION: No active cardiopulmonary disease. Electronically Signed   By: Inez Catalina M.D.   On: 09/22/2015 13:05   Ct Angio Chest Aorta W &/or Wo Contrast  Result Date: 10/02/2015 CLINICAL DATA:  Aortic regurgitation with dilated aortic root. Enlarged aorta on echo. EXAM: CT ANGIOGRAPHY CHEST WITH CONTRAST TECHNIQUE: Multidetector CT imaging of the chest was performed using the standard protocol during bolus administration of intravenous contrast. Multiplanar CT image reconstructions and MIPs were obtained to evaluate the vascular anatomy. CONTRAST:  Contrast 100 cc Isovue 370 COMPARISON:  No comparison studies available. FINDINGS: Mediastinum / Lymph Nodes: Study is nongated, creating some motion artifact at the level of the proximal aorta. Double oblique imaging in [employer] through the sinuses of Valsalva reveals transverse orthogonal aortic diameter of 4.1 x 4.2 mm. Measuring at the level of the sino-tubular junction, transaxial orthogonal measurements through the ascending aorta are 3.7 x 4.0 cm. At the level of the mid ascending aorta, transverse orthogonal diameters are 4.3 x 4.2 cm. Heart is enlarged. Coronary artery calcification is noted. No pericardial effusion. There is no axillary lymphadenopathy. No mediastinal lymphadenopathy. There is no hilar lymphadenopathy. The esophagus has normal imaging features. Lungs / Pleura: No focal airspace consolidation. No suspicious pulmonary nodule or mass. No pulmonary edema or pleural effusion. Subsegmental atelectasis/scarring noted in the lung bases. Upper Abdomen: 1.4 cm low-density lesion in the left liver cannot be fully characterized but may be a cyst. A smaller similar 7 mm lesion is seen in the right liver. MSK / Soft Tissues: Bone windows reveal no  worrisome lytic or sclerotic osseous lesions. Review of the MIP images confirms the above findings. IMPRESSION: 1. Measurements of the ascending aorta, as outlined above. Maximum diameter of the ascending segments 4.3 cm. Recommend annual imaging followup by CTA or MRA. This recommendation follows 2010 ACCF/AHA/AATS/ACR/ASA/SCA/SCAI/SIR/STS/SVM Guidelines for the Diagnosis and Management of Patients with Thoracic Aortic Disease. Circulation. 2010; [id number] Electronically Signed   By: Misty Stanley M.D.   On: 10/02/2015 16:14    Assessment/Plan  Unsteady gait With right knee OA. Will have patient work with PT/OT as tolerated to regain strength and  restore function.  Fall precautions are in place.  Right knee OA S/p right toal knee arthroplasty. Has orthopedics follow up. Continue percocet 5-325 mg 1-2 tab q4h prn pain. Continue apixaban for dvt prophylaxis. Will have her work with physical therapy and occupational therapy team to help with gait training and muscle strengthening exercises.fall precautions. Skin care. Encourage to be out of bed.   Blood loss anemia Post op, monitor cbc  Leg edema Add ted hose and monitor, keep legs elevated at rest  Hypoxia On o2 2l/min on prn basis, no hypoxic episode today during PT. Wean off o2 as tolerated. cxr reviewed which is negative for acute infiltrates. Likely from atelectasis. Encouraged to use incentive spirometer  Increased urinary frequency No signs of UTI on urine report. Concern for possible OAB. Monitor clinically for now. If no improvement, consider myrbetriq.  afib Rate controlled. Continue amiodarone 200 mg daily, cardizem 240 mg daily with toprol xl 25 mg daily. Continue apixaban for anticoagulation. She has a pacemaker  HTN Monitor bp reading and continue metoprolol for now. Check bmp   Goals of care: short term rehabilitation   Labs/tests ordered: cbc, cmp  Family/ staff Communication: reviewed care plan with patient and  nursing supervisor    Blanchie Serve, MD Internal Medicine [place], [place] [address] Cell Phone (Monday-Friday 8 am - 5 pm): [phone] On Call: [phone] and follow prompts after 5 pm and on weekends Office Phone: [phone] Office Fax: [phone]

## 2015-10-13 NOTE — Telephone Encounter (Signed)
Rx faxed to [facility] @ [phone], phone number [phone]  

## 2015-10-14 ENCOUNTER — Non-Acute Institutional Stay (SKILLED_NURSING_FACILITY): Payer: Medicare Other | Admitting: Family

## 2015-10-14 ENCOUNTER — Encounter: Payer: Self-pay | Admitting: Family

## 2015-10-14 DIAGNOSIS — I482 Chronic atrial fibrillation, unspecified: Secondary | ICD-10-CM

## 2015-10-14 DIAGNOSIS — I10 Essential (primary) hypertension: Secondary | ICD-10-CM

## 2015-10-14 DIAGNOSIS — M1711 Unilateral primary osteoarthritis, right knee: Secondary | ICD-10-CM

## 2015-10-14 DIAGNOSIS — R2681 Unsteadiness on feet: Secondary | ICD-10-CM

## 2015-10-14 LAB — CBC AND DIFFERENTIAL
HCT: 36 % (ref 36–46)
HEMATOCRIT: 36 % (ref 36–46)
HEMOGLOBIN: 11.9 g/dL — AB (ref 12.0–16.0)
Hemoglobin: 11.9 g/dL — AB (ref 12.0–16.0)
Hemoglobin: 11.9 g/dL — AB (ref 12.0–16.0)
PLATELETS: 26 10*3/uL — AB (ref 150–399)
Platelets: 261 10*3/uL (ref 150–399)
WBC: 7.2 10*3/mL
WBC: 7.2 10^3/mL
WBC: 7.2 10^3/mL

## 2015-10-14 LAB — HEPATIC FUNCTION PANEL
ALT: 19 U/L (ref 7–35)
AST: 23 U/L (ref 13–35)
Alkaline Phosphatase: 74 U/L (ref 25–125)
Bilirubin, Total: 1.6 mg/dL

## 2015-10-14 LAB — BASIC METABOLIC PANEL
BUN: 14 mg/dL (ref 4–21)
CREATININE: 0.7 mg/dL (ref 0.5–1.1)
GLUCOSE: 87 mg/dL
POTASSIUM: 4.5 mmol/L (ref 3.4–5.3)
Sodium: 142 mmol/L (ref 137–147)

## 2015-10-14 NOTE — Progress Notes (Signed)
Location:    [facility] Room Number: 902-P Place of Service:  SNF [phone])  Provider: Marlowe Sax FNP-C   PCP: Donnie Coffin, MD Patient Care Team: L.Donnie Coffin, MD as PCP - General (Family Medicine)  Extended Emergency Contact Information Primary Emergency Contact: [name] Address: [address]          [place], [place] [address] [name] of [place] Phone: [phone] Mobile Phone: [phone] Relation: Daughter Secondary Emergency Contact: Porter,Vivian Address: [address]          [place], [place] [address] [name] of [place] Phone: [phone] Mobile Phone: [phone] Relation: Daughter  Code Status: Full Code  Goals of care:  Advanced Directive information Advanced Directives 09/22/2015  Does patient have an advance directive? Yes  Type of Paramedic of [place];Living will  Does patient want to make changes to advanced directive? No - Patient declined  Copy of advanced directive(s) in chart? No - copy requested     Allergies  Allergen Reactions  . Sulfa Antibiotics Swelling  . Codeine Nausea And Vomiting    Chief Complaint  Patient presents with  . Discharge Note    HPI:  82 y.o. female seen today at  [facility] for discharge home. She was here for short term rehabilitation post hospital admission from 10/03/15-10/06/15 with right knee OA. She underwent right total knee arthroplasty on 10/03/2015. She is seen in her room today. She denies any acute issues this visit. She states right knee pain under control with current regimen. She has worked well with PT/OT.She also uses CPM. During the course of her rehab here she complained of increased frequent urination U/A and C/S were negative for UTI though urine color was noted to be cloudy. Encouraged to increase fluid intake. Her oxygen saturation dropped to 80's on 10/08/2015 overnight and had to wear  continuous oxygen via nasal cannula. Her portable CXR was negative. She has wean off oxygen since then without any shortness of breath or hypoxia. Incenter's spirometer encouraged.She denies any fever, chills, cough or shortness of breath. She is stable for discharge home.She will be discharge home with right knee CPM. She will continue ROM, exercise, Gait stability and muscle strengthening  with outpatient therapy. Prescription medication will be written x 1 month then patient to follow up with PCP in 1-2 weeks. Facility staff report no new concerns.     Past Medical History:  Diagnosis Date  . A-fib ([place])   . Allergic rhinitis, cause unspecified   . Chronic anticoagulation   . Family history of adverse reaction to anesthesia    all children - PONV  . Generalized osteoarthrosis, unspecified site   . GERD (gastroesophageal reflux disease)    pt. denies  . Hemorrhage of rectum and anus   . History of bronchitis   . HTN (hypertension)   . Hypercholesteremia    pt. denies  . Hypertonicity of bladder   . Pancreatitis, gallstone   . PONV (postoperative nausea and vomiting)   . Presence of permanent cardiac pacemaker   . Renal cell cancer ([place])    pt. denies  . Tachy-brady syndrome (HCC)    s/p PPM  . Urinary incontinence   . Wears glasses    reading  . Wears partial dentures    upper partial    Past Surgical History:  Procedure Laterality Date  . ABDOMINAL HYSTERECTOMY    . COLONOSCOPY    . LAPAROSCOPIC  CHOLECYSTECTOMY  2007   lap choli  . left knee replacement - Dr. French Ana  2012   lt total knee  . PACEMAKER GENERATOR CHANGE  04/2013   MDT ADDRL1 pacemaker implanted by Dr Lovena Le  . PACEMAKER INSERTION  2007  . PERMANENT PACEMAKER GENERATOR CHANGE N/A 05/03/2013   Procedure: PERMANENT PACEMAKER GENERATOR CHANGE;  Surgeon: Evans Lance, MD;  Location: [facility] CATH LAB;  Service: Cardiovascular;  Laterality: N/A;  . PROXIMAL INTERPHALANGEAL FUSION (PIP) Right 08/22/2013    Procedure: EXCISION MUCOID CYST DEBRIDEMENT PROXIMAL INTERPHALANGEAL JOINT;  Surgeon: Wynonia Sours, MD;  Location: [facility] [employer] [facility];  Service: Orthopedics;  Laterality: Right;  . THUMB ARTHROSCOPY  2007   right  . TOTAL ABDOMINAL HYSTERECTOMY W/ BILATERAL SALPINGOOPHORECTOMY    . TOTAL KNEE ARTHROPLASTY Right 10/03/2015   Procedure: RIGHT TOTAL KNEE ARTHROPLASTY;  Surgeon: Earlie Server, MD;  Location: [facility];  Service: Orthopedics;  Laterality: Right;  . TUBAL LIGATION    . VEIN LIGATION        reports that she has never smoked. She does not have any smokeless tobacco history on file. She reports that she does not drink alcohol or use drugs. Social History   Social History  . Marital status: Married    Spouse name: N/A  . Number of children: N/A  . Years of education: N/A   Occupational History  . Not on file.   Social History Main Topics  . Smoking status: Never Smoker  . Smokeless tobacco: Not on file  . Alcohol use No  . Drug use: No  . Sexual activity: Not on file   Other Topics Concern  . Not on file   Social History Narrative  . No narrative on file   Functional Status Survey:    Allergies  Allergen Reactions  . Sulfa Antibiotics Swelling  . Codeine Nausea And Vomiting    Pertinent  Health Maintenance Due  Topic Date Due  . DEXA SCAN  01/21/1994  . PNA vac Low Risk Adult (1 of 2 - PCV13) 01/21/1994  . INFLUENZA VACCINE  10/21/2015    Medications:   Medication List       Accurate as of 10/14/15  5:45 PM. Always use your most recent med list.          acetaminophen 650 MG CR tablet Commonly known as:  TYLENOL Take 650 mg by mouth every 8 (eight) hours as needed for pain.   amiodarone 200 MG tablet Commonly known as:  PACERONE Take 1 tablet (200 mg total) by mouth daily.   BIOFREEZE 4 % Gel Generic drug:  Menthol (Topical Analgesic) Apply 1 application topically 2 (two) times daily. Apply to her leg and knee for pain. Stop date  10/20/15   conjugated estrogens vaginal cream Commonly known as:  PREMARIN Place 1 Applicatorful vaginally 2 (two) times a week.   diltiazem 240 MG 24 hr capsule Commonly known as:  CARDIZEM CD Take 1 capsule by mouth once daily   ELIQUIS 5 MG Tabs tablet Generic drug:  apixaban Take 5 mg by mouth daily.   HYDROcodone-acetaminophen 5-325 MG tablet Commonly known as:  NORCO Take 1-2 tablets by mouth every 4 (four) hours as needed for moderate pain.   loratadine 10 MG tablet Commonly known as:  CLARITIN Take 10 mg by mouth daily as needed for allergies.   methocarbamol 500 MG tablet Commonly known as:  ROBAXIN Take 500 mg by mouth every 8 (eight) hours as needed for muscle  spasms.   metoprolol succinate 25 MG 24 hr tablet Commonly known as:  TOPROL-XL Take 1 tablet (25 mg total) by mouth daily.   multivitamin-iron-minerals-folic acid chewable tablet Chew 1 tablet by mouth 3 (three) times a week.   ondansetron 4 MG tablet Commonly known as:  ZOFRAN Take 4 mg by mouth every 6 (six) hours as needed for nausea or vomiting.   oxyCODONE-acetaminophen 5-325 MG tablet Commonly known as:  PERCOCET/ROXICET Take 1 tablet by mouth every 4 (four) hours as needed for moderate pain.   pseudoephedrine-guaifenesin 60-600 MG 12 hr tablet Commonly known as:  MUCINEX D Take 1 tablet by mouth 2 (two) times daily as needed for congestion.       Review of Systems  Constitutional: Negative for activity change, appetite change, chills, fatigue and fever.  HENT: Negative for congestion, rhinorrhea, sinus pressure, sneezing and sore throat.   Eyes: Negative.   Respiratory: Negative for cough, chest tightness, shortness of breath and wheezing.   Cardiovascular: Negative for chest pain and palpitations.       Wears Ted hose   Gastrointestinal: Negative for abdominal distention, abdominal pain, constipation, diarrhea, nausea and vomiting.  Endocrine: Negative.   Genitourinary: Negative for  dysuria, flank pain, frequency and urgency.  Musculoskeletal: Positive for gait problem.       Right knee pain controlled with current medication   Skin: Negative for color change, pallor and rash.       Right knee surgical drsg   Neurological: Negative for dizziness, seizures, syncope, light-headedness and headaches.  Hematological: Does not bruise/bleed easily.  Psychiatric/Behavioral: Negative for agitation, confusion, hallucinations and sleep disturbance. The patient is not nervous/anxious.     Vitals:   10/14/15 0855  BP: 114/66  Pulse: 73  Resp: 16  Temp: 98.2 F (36.8 C)  TempSrc: Oral  SpO2: 93%  Weight: 161 lb 3.2 oz (73.1 kg)  Height: 5\' 6"  (1.676 m)   Body mass index is 26.02 kg/m. Physical Exam  Constitutional: She is oriented to person, place, and time. She appears well-developed and well-nourished. No distress.  HENT:  Head: Normocephalic.  Mouth/Throat: Oropharynx is clear and moist. No oropharyngeal exudate.  Eyes: Conjunctivae and EOM are normal. Pupils are equal, round, and reactive to light. Right eye exhibits no discharge. Left eye exhibits no discharge. No scleral icterus.  Neck: Normal range of motion. No JVD present. No thyromegaly present.  Cardiovascular: Intact distal pulses.  Exam reveals no gallop and no friction rub.   No murmur heard. Pacemaker   Pulmonary/Chest: Effort normal and breath sounds normal. No respiratory distress. She has no wheezes. She has no rales.  Abdominal: Soft. Bowel sounds are normal. She exhibits no distension. There is no tenderness. There is no rebound and no guarding.  Musculoskeletal: She exhibits no tenderness.  Unsteady gait.Uses walker with assistance.Right knee limited ROM due to pain. Ted hose in place.Tolerating CMP well.   Lymphadenopathy:    She has no cervical adenopathy.  Neurological: She is oriented to person, place, and time.  Skin: Skin is warm and dry. No rash noted. No erythema. No pallor.  Right knee  surgical drsg dry, clean and intact. Surrounding skin without any signs of infections.   Psychiatric: She has a normal mood and affect.    Labs reviewed: Basic Metabolic Panel:  Recent Labs  09/22/15 1150 10/04/15 0550  NA 141 140  K 4.1 4.2  CL 110 107  CO2 27 25  GLUCOSE 71 132*  BUN 18 12  CREATININE 0.94 0.67  CALCIUM 9.7 9.1   Liver Function Tests:  Recent Labs  09/22/15 1150  AST 17  ALT 12*  ALKPHOS 59  BILITOT 0.9  PROT 6.4*  ALBUMIN 3.5   No results for input(s): LIPASE, AMYLASE in the last 8760 hours. No results for input(s): AMMONIA in the last 8760 hours. CBC:  Recent Labs  09/22/15 1150 10/04/15 0550 10/05/15 0842 10/06/15 0335  WBC 5.8 10.2 11.0* 8.2  NEUTROABS 3.7  --   --   --   HGB 14.0 12.2 12.2 10.8*  HCT 42.7 36.3 36.0 32.5*  MCV 95.5 93.1 92.5 92.9  PLT 125* 103* 101* 95*   Cardiac Enzymes: No results for input(s): CKTOTAL, CKMB, CKMBINDEX, TROPONINI in the last 8760 hours. BNP: Invalid input(s): POCBNP CBG: No results for input(s): GLUCAP in the last 8760 hours.  Procedures and Imaging Studies During Stay: Dg Chest 2 View  Result Date: 09/22/2015 CLINICAL DATA:  Preoperative evaluation for knee surgery, initial encounter EXAM: CHEST  2 VIEW COMPARISON:  04/16/2013 FINDINGS: Cardiac shadow is mildly enlarged but stable. A pacing device is again seen and stable. The lungs are clear bilaterally. Minimal scarring is noted posteriorly. No acute bony abnormality is noted. IMPRESSION: No active cardiopulmonary disease. Electronically Signed   By: Inez Catalina M.D.   On: 09/22/2015 13:05   Ct Angio Chest Aorta W &/or Wo Contrast  Result Date: 10/02/2015 CLINICAL DATA:  Aortic regurgitation with dilated aortic root. Enlarged aorta on echo. EXAM: CT ANGIOGRAPHY CHEST WITH CONTRAST TECHNIQUE: Multidetector CT imaging of the chest was performed using the standard protocol during bolus administration of intravenous contrast. Multiplanar CT image  reconstructions and MIPs were obtained to evaluate the vascular anatomy. CONTRAST:  Contrast 100 cc Isovue 370 COMPARISON:  No comparison studies available. FINDINGS: Mediastinum / Lymph Nodes: Study is nongated, creating some motion artifact at the level of the proximal aorta. Double oblique imaging in [employer] through the sinuses of Valsalva reveals transverse orthogonal aortic diameter of 4.1 x 4.2 mm. Measuring at the level of the sino-tubular junction, transaxial orthogonal measurements through the ascending aorta are 3.7 x 4.0 cm. At the level of the mid ascending aorta, transverse orthogonal diameters are 4.3 x 4.2 cm. Heart is enlarged. Coronary artery calcification is noted. No pericardial effusion. There is no axillary lymphadenopathy. No mediastinal lymphadenopathy. There is no hilar lymphadenopathy. The esophagus has normal imaging features. Lungs / Pleura: No focal airspace consolidation. No suspicious pulmonary nodule or mass. No pulmonary edema or pleural effusion. Subsegmental atelectasis/scarring noted in the lung bases. Upper Abdomen: 1.4 cm low-density lesion in the left liver cannot be fully characterized but may be a cyst. A smaller similar 7 mm lesion is seen in the right liver. MSK / Soft Tissues: Bone windows reveal no worrisome lytic or sclerotic osseous lesions. Review of the MIP images confirms the above findings. IMPRESSION: 1. Measurements of the ascending aorta, as outlined above. Maximum diameter of the ascending segments 4.3 cm. Recommend annual imaging followup by CTA or MRA. This recommendation follows 2010 ACCF/AHA/AATS/ACR/ASA/SCA/SCAI/SIR/STS/SVM Guidelines for the Diagnosis and Management of Patients with Thoracic Aortic Disease. Circulation. 2010; [id number] Electronically Signed   By: Misty Stanley M.D.   On: 10/02/2015 16:14    Assessment/Plan:   1. Essential hypertension B/p stable. Continue on Diltiazem 240 mg Capsule and Metoprolol 25 mg 24 HR tablet.  BMP in 1-2 weeks with PCP  2. Chronic atrial fibrillation (HCC) Continue on Amiodarone 200 mg Tablet, Diltiazem  240 mg Capsule and Metoprolol 25 mg 24 HR tablet. Continue on Eliqus 5 mg Tablet.   3. Primary localized osteoarthritis of right knee S/p short term rehabilitation post hospital admission from 10/03/15-10/06/15 with right knee OA. She underwent right total knee arthroplasty on 10/03/2015.Continue on  Eliqus 5 mg Tablet for DVT prevention. She will continue with PT/OT on outpatient. Discharge home with CPM patient 's family directed by facility staff on operation of CPM. Follow up with Ortho specialist as directed.   4. Unsteady gait Has worked with PT/OT. Fall and safety precautions. She does not require any DME has own walker and 3-1 at home.     Patient is being discharged with the following home health services:    Out patient Therapy  Patient is being discharged with the following durable medical equipment:    CPM  Patient has been advised to f/u with their PCP in 1-2 weeks to bring them up to date on their rehab stay.  Social services at facility was responsible for arranging this appointment.  Pt was provided with a 30 day supply of prescriptions for medications and refills must be obtained from their PCP.  For controlled substances, a more limited supply may be provided adequate until PCP appointment only.  Future labs/tests needed:  CBC, BMP in 1-2 weeks with PCP

## 2015-10-16 DIAGNOSIS — M1711 Unilateral primary osteoarthritis, right knee: Secondary | ICD-10-CM | POA: Diagnosis not present

## 2015-10-17 ENCOUNTER — Other Ambulatory Visit: Payer: Self-pay | Admitting: *Deleted

## 2015-10-17 ENCOUNTER — Telehealth: Payer: Self-pay | Admitting: Internal Medicine

## 2015-10-17 ENCOUNTER — Telehealth: Payer: Self-pay

## 2015-10-17 ENCOUNTER — Other Ambulatory Visit: Payer: Self-pay

## 2015-10-17 DIAGNOSIS — M6281 Muscle weakness (generalized): Secondary | ICD-10-CM | POA: Diagnosis not present

## 2015-10-17 DIAGNOSIS — M25561 Pain in right knee: Secondary | ICD-10-CM | POA: Diagnosis not present

## 2015-10-17 DIAGNOSIS — R262 Difficulty in walking, not elsewhere classified: Secondary | ICD-10-CM | POA: Diagnosis not present

## 2015-10-17 DIAGNOSIS — M25661 Stiffness of right knee, not elsewhere classified: Secondary | ICD-10-CM | POA: Diagnosis not present

## 2015-10-17 MED ORDER — APIXABAN 5 MG PO TABS: 5.0000 mg | ORAL_TABLET | Freq: Two times a day (BID) | ORAL | 11 refills | Status: DC

## 2015-10-17 NOTE — Telephone Encounter (Signed)
New message      FYI the pt wants her prescriptions to be sent to [facility] on [address], [place], [place].

## 2015-10-17 NOTE — Telephone Encounter (Addendum)
Daughter presents today at front desk, stating mom needs a PA for Eliquis 5mg . She only has 2 pills left so I provided samples, 2 boxes. Eliquis is listed as a Historical med in her chart, to be taken daily only. I don't see where this was ordered like this, by any cardiologist. Advised her daughter that it should be taken bid. New order sent to [facility], and I will do the PA. Approved, with a [id number].

## 2015-10-17 NOTE — Telephone Encounter (Signed)
LM TO CALL BACK ./CY 

## 2015-10-19 ENCOUNTER — Encounter (HOSPITAL_BASED_OUTPATIENT_CLINIC_OR_DEPARTMENT_OTHER): Payer: Self-pay | Admitting: *Deleted

## 2015-10-19 ENCOUNTER — Emergency Department (HOSPITAL_BASED_OUTPATIENT_CLINIC_OR_DEPARTMENT_OTHER)
Admission: EM | Admit: 2015-10-19 | Discharge: 2015-10-19 | Disposition: A | Discharge status: Home / Self Care | Payer: Medicare Other | Attending: Emergency Medicine | Admitting: Emergency Medicine

## 2015-10-19 ENCOUNTER — Emergency Department (HOSPITAL_BASED_OUTPATIENT_CLINIC_OR_DEPARTMENT_OTHER): Payer: Medicare Other

## 2015-10-19 DIAGNOSIS — I10 Essential (primary) hypertension: Secondary | ICD-10-CM | POA: Diagnosis not present

## 2015-10-19 DIAGNOSIS — R05 Cough: Secondary | ICD-10-CM | POA: Diagnosis not present

## 2015-10-19 DIAGNOSIS — N390 Urinary tract infection, site not specified: Secondary | ICD-10-CM | POA: Insufficient documentation

## 2015-10-19 DIAGNOSIS — Z85528 Personal history of other malignant neoplasm of kidney: Secondary | ICD-10-CM | POA: Diagnosis not present

## 2015-10-19 DIAGNOSIS — E860 Dehydration: Secondary | ICD-10-CM | POA: Diagnosis not present

## 2015-10-19 DIAGNOSIS — R110 Nausea: Secondary | ICD-10-CM | POA: Diagnosis not present

## 2015-10-19 DIAGNOSIS — Z79899 Other long term (current) drug therapy: Secondary | ICD-10-CM | POA: Insufficient documentation

## 2015-10-19 LAB — CBC WITH DIFFERENTIAL/PLATELET
BASOS ABS: 0 10*3/uL (ref 0.0–0.1)
Basophils Relative: 0 %
EOS ABS: 0 10*3/uL (ref 0.0–0.7)
EOS PCT: 1 %
HCT: 37.3 % (ref 36.0–46.0)
Hemoglobin: 12.8 g/dL (ref 12.0–15.0)
LYMPHS PCT: 12 %
Lymphs Abs: 1.1 10*3/uL (ref 0.7–4.0)
MCH: 31.7 pg (ref 26.0–34.0)
MCHC: 34.3 g/dL (ref 30.0–36.0)
MCV: 92.3 fL (ref 78.0–100.0)
MONO ABS: 0.7 10*3/uL (ref 0.1–1.0)
Monocytes Relative: 8 %
Neutro Abs: 6.6 10*3/uL (ref 1.7–7.7)
Neutrophils Relative %: 79 %
PLATELETS: 293 10*3/uL (ref 150–400)
RBC: 4.04 MIL/uL (ref 3.87–5.11)
RDW: 14.8 % (ref 11.5–15.5)
WBC: 8.5 10*3/uL (ref 4.0–10.5)

## 2015-10-19 LAB — COMPREHENSIVE METABOLIC PANEL
ALK PHOS: 65 U/L (ref 38–126)
ALT: 16 U/L (ref 14–54)
AST: 19 U/L (ref 15–41)
Albumin: 3.5 g/dL (ref 3.5–5.0)
Anion gap: 7 (ref 5–15)
BUN: 15 mg/dL (ref 6–20)
CALCIUM: 9.7 mg/dL (ref 8.9–10.3)
CO2: 23 mmol/L (ref 22–32)
CREATININE: 0.85 mg/dL (ref 0.44–1.00)
Chloride: 109 mmol/L (ref 101–111)
Glucose, Bld: 95 mg/dL (ref 65–99)
Potassium: 3.9 mmol/L (ref 3.5–5.1)
Sodium: 139 mmol/L (ref 135–145)
Total Bilirubin: 1.3 mg/dL — ABNORMAL HIGH (ref 0.3–1.2)
Total Protein: 6.6 g/dL (ref 6.5–8.1)

## 2015-10-19 LAB — URINALYSIS, ROUTINE W REFLEX MICROSCOPIC
Bilirubin Urine: NEGATIVE
GLUCOSE, UA: NEGATIVE mg/dL
HGB URINE DIPSTICK: NEGATIVE
KETONES UR: NEGATIVE mg/dL
Nitrite: NEGATIVE
PROTEIN: NEGATIVE mg/dL
Specific Gravity, Urine: 1.017 (ref 1.005–1.030)
pH: 7 (ref 5.0–8.0)

## 2015-10-19 LAB — URINE MICROSCOPIC-ADD ON

## 2015-10-19 LAB — TROPONIN I

## 2015-10-19 MED ORDER — CEPHALEXIN 500 MG PO CAPS: 500.0000 mg | ORAL_CAPSULE | Freq: Two times a day (BID) | ORAL | 0 refills | Status: DC

## 2015-10-19 MED ORDER — ONDANSETRON 4 MG PO TBDP: ORAL_TABLET | ORAL | 0 refills | Status: DC

## 2015-10-19 MED ORDER — ONDANSETRON HCL 4 MG/2ML IJ SOLN
4.0000 mg | Freq: Once | INTRAMUSCULAR | Status: AC
  Administered 2015-10-19: 4 mg via INTRAVENOUS
  Filled 2015-10-19: qty 2

## 2015-10-19 MED ORDER — SODIUM CHLORIDE 0.9 % IV BOLUS (SEPSIS)
500.0000 mL | Freq: Once | INTRAVENOUS | Status: AC
  Administered 2015-10-19: 500 mL via INTRAVENOUS

## 2015-10-19 MED ORDER — CEPHALEXIN 250 MG PO CAPS
500.0000 mg | ORAL_CAPSULE | Freq: Once | ORAL | Status: AC
  Administered 2015-10-19: 500 mg via ORAL
  Filled 2015-10-19: qty 2

## 2015-10-19 NOTE — ED Triage Notes (Signed)
Pt reports generalized weakness, fatigue, nausea and decreased po intake x Thursday. Seen at [facility] walk in clinic today and was told to come to ed for labs and fluids.

## 2015-10-19 NOTE — ED Provider Notes (Signed)
[facility] DEPT [facility] Provider Note   CSN: [id number] Arrival date & time: 10/19/15  1026  First Provider Contact:  First MD Initiated Contact with Patient 10/19/15 1042        History   Chief Complaint Chief Complaint  Patient presents with  . Nausea    HPI Molly Taylor is a 82 y.o. female.  Patient presents with nausea. She has a history of a recent right total knee replacement on July 14. She was in a rehabilitation facility following this. She's been at home for the last 4 days. Patient states that since she's been home she's had some nausea. She's had some dry heaves but no other vomiting. She denies any chest congestion. No fevers. She's had a little bit of coughing. No urinary symptoms. She states she had a good bowel movement this morning. She doesn't feel constipated. No new medications. She's not currently taking narcotic pain medication.  She is only taking Tylenol. She states her knee is slowly improving. She denies any increased swelling or redness to the area. She denies any chest pain or shortness of breath. She denies any headache or dizziness.      Past Medical History:  Diagnosis Date  . A-fib ([place])   . Allergic rhinitis, cause unspecified   . Chronic anticoagulation   . Family history of adverse reaction to anesthesia    all children - PONV  . Generalized osteoarthrosis, unspecified site   . GERD (gastroesophageal reflux disease)    pt. denies  . Hemorrhage of rectum and anus   . History of bronchitis   . HTN (hypertension)   . Hypercholesteremia    pt. denies  . Hypertonicity of bladder   . Pancreatitis, gallstone   . PONV (postoperative nausea and vomiting)   . Presence of permanent cardiac pacemaker   . Renal cell cancer ([place])    pt. denies  . Tachy-brady syndrome (HCC)    s/p PPM  . Urinary incontinence   . Wears glasses    reading  . Wears partial dentures    upper partial    Patient Active Problem List   Diagnosis Date Noted    . Primary localized osteoarthritis of right knee 10/03/2015  . Pacemaker 04/24/2013  . SOB (shortness of breath) 04/16/2013  . Encounter for therapeutic drug monitoring 04/12/2013  . Tachy-brady syndrome ([place]) 02/02/2013  . HTN (hypertension)   . Chronic anticoagulation   . Pancreatitis, gallstone   . Atrial fibrillation ([place]) 01/18/2013    Past Surgical History:  Procedure Laterality Date  . ABDOMINAL HYSTERECTOMY    . COLONOSCOPY    . LAPAROSCOPIC CHOLECYSTECTOMY  2007   lap choli  . left knee replacement - Dr. French Ana  2012   lt total knee  . PACEMAKER GENERATOR CHANGE  04/2013   MDT ADDRL1 pacemaker implanted by Dr Lovena Le  . PACEMAKER INSERTION  2007  . PERMANENT PACEMAKER GENERATOR CHANGE N/A 05/03/2013   Procedure: PERMANENT PACEMAKER GENERATOR CHANGE;  Surgeon: Evans Lance, MD;  Location: [facility] CATH LAB;  Service: Cardiovascular;  Laterality: N/A;  . PROXIMAL INTERPHALANGEAL FUSION (PIP) Right 08/22/2013   Procedure: EXCISION MUCOID CYST DEBRIDEMENT PROXIMAL INTERPHALANGEAL JOINT;  Surgeon: Wynonia Sours, MD;  Location: [facility];  Service: Orthopedics;  Laterality: Right;  . THUMB ARTHROSCOPY  2007   right  . TOTAL ABDOMINAL HYSTERECTOMY W/ BILATERAL SALPINGOOPHORECTOMY    . TOTAL KNEE ARTHROPLASTY Right 10/03/2015   Procedure: RIGHT TOTAL KNEE ARTHROPLASTY;  Surgeon: Earlie Server, MD;  Location: [facility];  Service: Orthopedics;  Laterality: Right;  . TUBAL LIGATION    . VEIN LIGATION      OB History    No data available       Home Medications    Prior to Admission medications   Medication Sig Start Date End Date Taking? Authorizing Provider  acetaminophen (TYLENOL) 650 MG CR tablet Take 650 mg by mouth every 8 (eight) hours as needed for pain.    Historical Provider, MD  amiodarone (PACERONE) 200 MG tablet Take 1 tablet (200 mg total) by mouth daily. 07/11/15   Evans Lance, MD  apixaban (ELIQUIS) 5 MG TABS tablet Take 1 tablet (5 mg total) by mouth  2 (two) times daily. 10/17/15   Sueanne Margarita, MD  cephALEXin (KEFLEX) 500 MG capsule Take 1 capsule (500 mg total) by mouth 2 (two) times daily. 10/19/15   Malvin Johns, MD  conjugated estrogens (PREMARIN) vaginal cream Place 1 Applicatorful vaginally 2 (two) times a week.    Historical Provider, MD  diltiazem (CARDIZEM CD) 240 MG 24 hr capsule Take 1 capsule by mouth once daily 02/06/15   Evans Lance, MD  HYDROcodone-acetaminophen [facility]) 5-325 MG tablet Take 1-2 tablets by mouth every 4 (four) hours as needed for moderate pain. 10/03/15   Chriss Czar, PA-C  loratadine (CLARITIN) 10 MG tablet Take 10 mg by mouth daily as needed for allergies.     Historical Provider, MD  Menthol, Topical Analgesic, (BIOFREEZE) 4 % GEL Apply 1 application topically 2 (two) times daily. Apply to her leg and knee for pain. Stop date 10/20/15    Historical Provider, MD  methocarbamol (ROBAXIN) 500 MG tablet Take 500 mg by mouth every 8 (eight) hours as needed for muscle spasms.    Historical Provider, MD  metoprolol succinate (TOPROL-XL) 25 MG 24 hr tablet Take 1 tablet (25 mg total) by mouth daily. 11/14/14   Sueanne Margarita, MD  multivitamin-iron-minerals-folic acid (CENTRUM) chewable tablet Chew 1 tablet by mouth 3 (three) times a week.     Historical Provider, MD  ondansetron (ZOFRAN ODT) 4 MG disintegrating tablet 4mg  ODT q4 hours prn nausea/vomit 10/19/15   Malvin Johns, MD  ondansetron (ZOFRAN) 4 MG tablet Take 4 mg by mouth every 6 (six) hours as needed for nausea or vomiting.    Historical Provider, MD  oxyCODONE-acetaminophen (PERCOCET/ROXICET) 5-325 MG tablet Take 1 tablet by mouth every 4 (four) hours as needed for moderate pain. 10/13/15   Lauree Chandler, NP  pseudoephedrine-guaifenesin (MUCINEX D) 60-600 MG per tablet Take 1 tablet by mouth 2 (two) times daily as needed for congestion.     Historical Provider, MD    Family History History reviewed. No pertinent family history.  Social  History Social History  Substance Use Topics  . Smoking status: Never Smoker  . Smokeless tobacco: Never Used  . Alcohol use No     Allergies   Sulfa antibiotics and Codeine   Review of Systems Review of Systems  Constitutional: Negative for chills, diaphoresis, fatigue and fever.  HENT: Negative for congestion, rhinorrhea and sneezing.   Eyes: Negative.   Respiratory: Negative for cough, chest tightness and shortness of breath.   Cardiovascular: Negative for chest pain and leg swelling.  Gastrointestinal: Positive for nausea. Negative for abdominal pain, blood in stool, diarrhea and vomiting.  Genitourinary: Negative for difficulty urinating, flank pain, frequency and hematuria.  Musculoskeletal: Positive for arthralgias. Negative for back pain.  Skin: Negative for rash.  Neurological: Negative for dizziness, speech difficulty, weakness, numbness and headaches.     Physical Exam Updated Vital Signs BP 141/72   Pulse 66   Temp 98.6 F (37 C) (Oral)   Resp (!) 9   Ht 5\' 5"  (1.651 m)   Wt 156 lb (70.8 kg)   SpO2 96%   BMI 25.96 kg/m   Physical Exam  Constitutional: She is oriented to person, place, and time. She appears well-developed and well-nourished.  HENT:  Head: Normocephalic and atraumatic.  Eyes: Pupils are equal, round, and reactive to light.  Neck: Normal range of motion. Neck supple.  Cardiovascular: Normal rate, regular rhythm and normal heart sounds.   Pulmonary/Chest: Effort normal and breath sounds normal. No respiratory distress. She has no wheezes. She has no rales. She exhibits no tenderness.  Abdominal: Soft. Bowel sounds are normal. There is no tenderness. There is no rebound and no guarding.  Musculoskeletal: Normal range of motion. She exhibits no edema.  Moderate swelling to the right knee. There is mild warmth but no erythema. Incision appears intact.  Lymphadenopathy:    She has no cervical adenopathy.  Neurological: She is alert and  oriented to person, place, and time.  Skin: Skin is warm and dry. No rash noted.  Psychiatric: She has a normal mood and affect.     ED Treatments / Results  Labs (all labs ordered are listed, but only abnormal results are displayed) Results for orders placed or performed during the hospital encounter of 10/19/15  CBC with Differential  Result Value Ref Range   WBC 8.5 4.0 - 10.5 K/uL   RBC 4.04 3.87 - 5.11 MIL/uL   Hemoglobin 12.8 12.0 - 15.0 g/dL   HCT 37.3 36.0 - 46.0 %   MCV 92.3 78.0 - 100.0 fL   MCH 31.7 26.0 - 34.0 pg   MCHC 34.3 30.0 - 36.0 g/dL   RDW 14.8 11.5 - 15.5 %   Platelets 293 150 - 400 K/uL   Neutrophils Relative % 79 %   Neutro Abs 6.6 1.7 - 7.7 K/uL   Lymphocytes Relative 12 %   Lymphs Abs 1.1 0.7 - 4.0 K/uL   Monocytes Relative 8 %   Monocytes Absolute 0.7 0.1 - 1.0 K/uL   Eosinophils Relative 1 %   Eosinophils Absolute 0.0 0.0 - 0.7 K/uL   Basophils Relative 0 %   Basophils Absolute 0.0 0.0 - 0.1 K/uL  Urinalysis, Routine w reflex microscopic  Result Value Ref Range   Color, Urine YELLOW YELLOW   APPearance CLOUDY (A) CLEAR   Specific Gravity, Urine 1.017 1.005 - 1.030   pH 7.0 5.0 - 8.0   Glucose, UA NEGATIVE NEGATIVE mg/dL   Hgb urine dipstick NEGATIVE NEGATIVE   Bilirubin Urine NEGATIVE NEGATIVE   Ketones, ur NEGATIVE NEGATIVE mg/dL   Protein, ur NEGATIVE NEGATIVE mg/dL   Nitrite NEGATIVE NEGATIVE   Leukocytes, UA MODERATE (A) NEGATIVE  Troponin I  Result Value Ref Range   Troponin I <0.03 <0.03 ng/mL  Urine microscopic-add on  Result Value Ref Range   Squamous Epithelial / LPF 0-5 (A) NONE SEEN   WBC, UA 6-30 0 - 5 WBC/hpf   RBC / HPF 0-5 0 - 5 RBC/hpf   Bacteria, UA MANY (A) NONE SEEN  Comprehensive metabolic panel  Result Value Ref Range   Sodium 139 135 - 145 mmol/L   Potassium 3.9 3.5 - 5.1 mmol/L   Chloride 109 101 - 111 mmol/L   CO2 23 22 -  32 mmol/L   Glucose, Bld 95 65 - 99 mg/dL   BUN 15 6 - 20 mg/dL   Creatinine, Ser  0.85 0.44 - 1.00 mg/dL   Calcium 9.7 8.9 - 10.3 mg/dL   Total Protein 6.6 6.5 - 8.1 g/dL   Albumin 3.5 3.5 - 5.0 g/dL   AST 19 15 - 41 U/L   ALT 16 14 - 54 U/L   Alkaline Phosphatase 65 38 - 126 U/L   Total Bilirubin 1.3 (H) 0.3 - 1.2 mg/dL   GFR calc non Af Amer >60 >60 mL/min   GFR calc Af Amer >60 >60 mL/min   Anion gap 7 5 - 15   Dg Chest 2 View  Result Date: 10/19/2015 CLINICAL DATA:  Nausea, cough EXAM: CHEST  2 VIEW COMPARISON:  09/22/2015 FINDINGS: There is hyperinflation of the lungs compatible with COPD. Mild cardiomegaly. Left base remains in place, unchanged. Lungs are clear. No effusions or acute bony abnormality. IMPRESSION: COPD.  No active disease. Electronically Signed   By: Rolm Baptise M.D.   On: 10/19/2015 11:56  Dg Chest 2 View  Result Date: 09/22/2015 CLINICAL DATA:  Preoperative evaluation for knee surgery, initial encounter EXAM: CHEST  2 VIEW COMPARISON:  04/16/2013 FINDINGS: Cardiac shadow is mildly enlarged but stable. A pacing device is again seen and stable. The lungs are clear bilaterally. Minimal scarring is noted posteriorly. No acute bony abnormality is noted. IMPRESSION: No active cardiopulmonary disease. Electronically Signed   By: Inez Catalina M.D.   On: 09/22/2015 13:05   Ct Angio Chest Aorta W &/or Wo Contrast  Result Date: 10/02/2015 CLINICAL DATA:  Aortic regurgitation with dilated aortic root. Enlarged aorta on echo. EXAM: CT ANGIOGRAPHY CHEST WITH CONTRAST TECHNIQUE: Multidetector CT imaging of the chest was performed using the standard protocol during bolus administration of intravenous contrast. Multiplanar CT image reconstructions and MIPs were obtained to evaluate the vascular anatomy. CONTRAST:  Contrast 100 cc Isovue 370 COMPARISON:  No comparison studies available. FINDINGS: Mediastinum / Lymph Nodes: Study is nongated, creating some motion artifact at the level of the proximal aorta. Double oblique imaging in [employer] through the  sinuses of Valsalva reveals transverse orthogonal aortic diameter of 4.1 x 4.2 mm. Measuring at the level of the sino-tubular junction, transaxial orthogonal measurements through the ascending aorta are 3.7 x 4.0 cm. At the level of the mid ascending aorta, transverse orthogonal diameters are 4.3 x 4.2 cm. Heart is enlarged. Coronary artery calcification is noted. No pericardial effusion. There is no axillary lymphadenopathy. No mediastinal lymphadenopathy. There is no hilar lymphadenopathy. The esophagus has normal imaging features. Lungs / Pleura: No focal airspace consolidation. No suspicious pulmonary nodule or mass. No pulmonary edema or pleural effusion. Subsegmental atelectasis/scarring noted in the lung bases. Upper Abdomen: 1.4 cm low-density lesion in the left liver cannot be fully characterized but may be a cyst. A smaller similar 7 mm lesion is seen in the right liver. MSK / Soft Tissues: Bone windows reveal no worrisome lytic or sclerotic osseous lesions. Review of the MIP images confirms the above findings. IMPRESSION: 1. Measurements of the ascending aorta, as outlined above. Maximum diameter of the ascending segments 4.3 cm. Recommend annual imaging followup by CTA or MRA. This recommendation follows 2010 ACCF/AHA/AATS/ACR/ASA/SCA/SCAI/SIR/STS/SVM Guidelines for the Diagnosis and Management of Patients with Thoracic Aortic Disease. Circulation. 2010; [id number] Electronically Signed   By: Misty Stanley M.D.   On: 10/02/2015 16:14     EKG  EKG Interpretation  Date/Time:  Sunday October 19 2015 11:22:12 EDT Ventricular Rate:  63 PR Interval:    QRS Duration: 171 QT Interval:  503 QTC Calculation: 515 R Axis:   -74 Text Interpretation:  Sinus or ectopic atrial rhythm Short PR interval RBBB and LAFB Left ventricular hypertrophy Inferior infarct, acute (RCA) Lateral leads are also involved Probable RV involvement, suggest recording right precordial leads complexes look similar to her prior  paced rhythm, but no pacemaker spikes seen. Confirmed by Colin Ellers  MD, [place] [id number]) on 10/19/2015 11:29:20 AM       Radiology Dg Chest 2 View  Result Date: 10/19/2015 CLINICAL DATA:  Nausea, cough EXAM: CHEST  2 VIEW COMPARISON:  09/22/2015 FINDINGS: There is hyperinflation of the lungs compatible with COPD. Mild cardiomegaly. Left base remains in place, unchanged. Lungs are clear. No effusions or acute bony abnormality. IMPRESSION: COPD.  No active disease. Electronically Signed   By: Rolm Baptise M.D.   On: 10/19/2015 11:56   Procedures Procedures (including critical care time)  Medications Ordered in ED Medications  ondansetron (ZOFRAN) injection 4 mg (4 mg Intravenous Given 10/19/15 1121)  sodium chloride 0.9 % bolus 500 mL (0 mLs Intravenous Stopped 10/19/15 1235)  cephALEXin (KEFLEX) capsule 500 mg (500 mg Oral Given 10/19/15 1346)     Initial Impression / Assessment and Plan / ED Course  I have reviewed the triage vital signs and the nursing notes.  Pertinent labs & imaging results that were available during my care of the patient were reviewed by me and considered in my medical decision making (see chart for details).  Clinical Course    Patient presents with nausea. Her labs are non-concerning. Her urine does appear to be infected. It was sent for culture. She was given some IV fluids and Zofran. She's feeling better and is tolerating oral fluids. If any symptoms that we more consistent with acute coronary syndrome. Her initial EKG showed findings concerning for an acute MI however I feel it was more likely paced rhythm that we weren't seeing the pacemaker spikes. She doesn't have any chest pain or shortness of breath. We did a portable EKG which did show the pacemaker spikes. It looks the same as her prior paced EKGs. She doesn't have any abdominal pain or need for abdominal imaging. She's having bowel movements so I doubt this is related to constipation. I will start her on  antibiotics for her UTI. I encouraged her daughters to have her follow-up with her PCP within the next few days if her symptoms are not improving or return here as needed for any worsening symptoms.  Final Clinical Impressions(s) / ED Diagnoses   Final diagnoses:  Nausea  UTI (lower urinary tract infection)    New Prescriptions New Prescriptions   CEPHALEXIN (KEFLEX) 500 MG CAPSULE    Take 1 capsule (500 mg total) by mouth 2 (two) times daily.   ONDANSETRON (ZOFRAN ODT) 4 MG DISINTEGRATING TABLET    4mg  ODT q4 hours prn nausea/vomit     Malvin Johns, MD 10/19/15 1353

## 2015-10-20 ENCOUNTER — Other Ambulatory Visit: Payer: Self-pay

## 2015-10-20 DIAGNOSIS — R262 Difficulty in walking, not elsewhere classified: Secondary | ICD-10-CM | POA: Diagnosis not present

## 2015-10-20 DIAGNOSIS — M25661 Stiffness of right knee, not elsewhere classified: Secondary | ICD-10-CM | POA: Diagnosis not present

## 2015-10-20 DIAGNOSIS — M6281 Muscle weakness (generalized): Secondary | ICD-10-CM | POA: Diagnosis not present

## 2015-10-20 DIAGNOSIS — M25561 Pain in right knee: Secondary | ICD-10-CM | POA: Diagnosis not present

## 2015-10-20 LAB — URINE CULTURE

## 2015-10-20 MED ORDER — APIXABAN 5 MG PO TABS: 5.0000 mg | ORAL_TABLET | Freq: Two times a day (BID) | ORAL | 11 refills | Status: DC

## 2015-10-22 DIAGNOSIS — M6281 Muscle weakness (generalized): Secondary | ICD-10-CM | POA: Diagnosis not present

## 2015-10-22 DIAGNOSIS — R262 Difficulty in walking, not elsewhere classified: Secondary | ICD-10-CM | POA: Diagnosis not present

## 2015-10-22 DIAGNOSIS — M25561 Pain in right knee: Secondary | ICD-10-CM | POA: Diagnosis not present

## 2015-10-22 DIAGNOSIS — M25661 Stiffness of right knee, not elsewhere classified: Secondary | ICD-10-CM | POA: Diagnosis not present

## 2015-10-23 DIAGNOSIS — K219 Gastro-esophageal reflux disease without esophagitis: Secondary | ICD-10-CM | POA: Diagnosis not present

## 2015-10-23 DIAGNOSIS — N390 Urinary tract infection, site not specified: Secondary | ICD-10-CM | POA: Diagnosis not present

## 2015-10-23 DIAGNOSIS — I10 Essential (primary) hypertension: Secondary | ICD-10-CM | POA: Diagnosis not present

## 2015-10-23 DIAGNOSIS — M150 Primary generalized (osteo)arthritis: Secondary | ICD-10-CM | POA: Diagnosis not present

## 2015-10-24 DIAGNOSIS — M25561 Pain in right knee: Secondary | ICD-10-CM | POA: Diagnosis not present

## 2015-10-24 DIAGNOSIS — M6281 Muscle weakness (generalized): Secondary | ICD-10-CM | POA: Diagnosis not present

## 2015-10-24 DIAGNOSIS — R262 Difficulty in walking, not elsewhere classified: Secondary | ICD-10-CM | POA: Diagnosis not present

## 2015-10-24 DIAGNOSIS — M25611 Stiffness of right shoulder, not elsewhere classified: Secondary | ICD-10-CM | POA: Diagnosis not present

## 2015-10-27 DIAGNOSIS — M25561 Pain in right knee: Secondary | ICD-10-CM | POA: Diagnosis not present

## 2015-10-27 DIAGNOSIS — R262 Difficulty in walking, not elsewhere classified: Secondary | ICD-10-CM | POA: Diagnosis not present

## 2015-10-27 DIAGNOSIS — M25661 Stiffness of right knee, not elsewhere classified: Secondary | ICD-10-CM | POA: Diagnosis not present

## 2015-10-27 DIAGNOSIS — M6281 Muscle weakness (generalized): Secondary | ICD-10-CM | POA: Diagnosis not present

## 2015-10-29 DIAGNOSIS — M25661 Stiffness of right knee, not elsewhere classified: Secondary | ICD-10-CM | POA: Diagnosis not present

## 2015-10-29 DIAGNOSIS — M6281 Muscle weakness (generalized): Secondary | ICD-10-CM | POA: Diagnosis not present

## 2015-10-29 DIAGNOSIS — M25561 Pain in right knee: Secondary | ICD-10-CM | POA: Diagnosis not present

## 2015-10-29 DIAGNOSIS — R262 Difficulty in walking, not elsewhere classified: Secondary | ICD-10-CM | POA: Diagnosis not present

## 2015-10-30 DIAGNOSIS — M25561 Pain in right knee: Secondary | ICD-10-CM | POA: Diagnosis not present

## 2015-10-31 DIAGNOSIS — M25561 Pain in right knee: Secondary | ICD-10-CM | POA: Diagnosis not present

## 2015-10-31 DIAGNOSIS — M6281 Muscle weakness (generalized): Secondary | ICD-10-CM | POA: Diagnosis not present

## 2015-10-31 DIAGNOSIS — R262 Difficulty in walking, not elsewhere classified: Secondary | ICD-10-CM | POA: Diagnosis not present

## 2015-10-31 DIAGNOSIS — M25661 Stiffness of right knee, not elsewhere classified: Secondary | ICD-10-CM | POA: Diagnosis not present

## 2015-11-04 DIAGNOSIS — M25561 Pain in right knee: Secondary | ICD-10-CM | POA: Diagnosis not present

## 2015-11-04 DIAGNOSIS — M25661 Stiffness of right knee, not elsewhere classified: Secondary | ICD-10-CM | POA: Diagnosis not present

## 2015-11-04 DIAGNOSIS — R262 Difficulty in walking, not elsewhere classified: Secondary | ICD-10-CM | POA: Diagnosis not present

## 2015-11-04 DIAGNOSIS — M6281 Muscle weakness (generalized): Secondary | ICD-10-CM | POA: Diagnosis not present

## 2015-11-07 DIAGNOSIS — M25561 Pain in right knee: Secondary | ICD-10-CM | POA: Diagnosis not present

## 2015-11-07 DIAGNOSIS — M25661 Stiffness of right knee, not elsewhere classified: Secondary | ICD-10-CM | POA: Diagnosis not present

## 2015-11-07 DIAGNOSIS — M6281 Muscle weakness (generalized): Secondary | ICD-10-CM | POA: Diagnosis not present

## 2015-11-07 DIAGNOSIS — R262 Difficulty in walking, not elsewhere classified: Secondary | ICD-10-CM | POA: Diagnosis not present

## 2015-11-11 DIAGNOSIS — M25561 Pain in right knee: Secondary | ICD-10-CM | POA: Diagnosis not present

## 2015-11-11 DIAGNOSIS — R262 Difficulty in walking, not elsewhere classified: Secondary | ICD-10-CM | POA: Diagnosis not present

## 2015-11-11 DIAGNOSIS — M6281 Muscle weakness (generalized): Secondary | ICD-10-CM | POA: Diagnosis not present

## 2015-11-11 DIAGNOSIS — M25661 Stiffness of right knee, not elsewhere classified: Secondary | ICD-10-CM | POA: Diagnosis not present

## 2015-11-13 DIAGNOSIS — M25561 Pain in right knee: Secondary | ICD-10-CM | POA: Diagnosis not present

## 2015-11-13 DIAGNOSIS — R262 Difficulty in walking, not elsewhere classified: Secondary | ICD-10-CM | POA: Diagnosis not present

## 2015-11-13 DIAGNOSIS — M6281 Muscle weakness (generalized): Secondary | ICD-10-CM | POA: Diagnosis not present

## 2015-11-13 DIAGNOSIS — M25661 Stiffness of right knee, not elsewhere classified: Secondary | ICD-10-CM | POA: Diagnosis not present

## 2015-11-14 ENCOUNTER — Ambulatory Visit: Payer: Medicare Other | Admitting: Cardiology

## 2015-11-18 DIAGNOSIS — M25561 Pain in right knee: Secondary | ICD-10-CM | POA: Diagnosis not present

## 2015-11-18 DIAGNOSIS — R262 Difficulty in walking, not elsewhere classified: Secondary | ICD-10-CM | POA: Diagnosis not present

## 2015-11-18 DIAGNOSIS — M6281 Muscle weakness (generalized): Secondary | ICD-10-CM | POA: Diagnosis not present

## 2015-11-18 DIAGNOSIS — M25661 Stiffness of right knee, not elsewhere classified: Secondary | ICD-10-CM | POA: Diagnosis not present

## 2015-11-20 DIAGNOSIS — M25561 Pain in right knee: Secondary | ICD-10-CM | POA: Diagnosis not present

## 2015-11-20 DIAGNOSIS — R262 Difficulty in walking, not elsewhere classified: Secondary | ICD-10-CM | POA: Diagnosis not present

## 2015-11-20 DIAGNOSIS — M6281 Muscle weakness (generalized): Secondary | ICD-10-CM | POA: Diagnosis not present

## 2015-11-20 DIAGNOSIS — M25661 Stiffness of right knee, not elsewhere classified: Secondary | ICD-10-CM | POA: Diagnosis not present

## 2015-11-21 ENCOUNTER — Encounter: Payer: Self-pay | Admitting: Cardiology

## 2015-11-25 DIAGNOSIS — M25561 Pain in right knee: Secondary | ICD-10-CM | POA: Diagnosis not present

## 2015-11-25 DIAGNOSIS — M6281 Muscle weakness (generalized): Secondary | ICD-10-CM | POA: Diagnosis not present

## 2015-11-25 DIAGNOSIS — M25661 Stiffness of right knee, not elsewhere classified: Secondary | ICD-10-CM | POA: Diagnosis not present

## 2015-11-25 DIAGNOSIS — R262 Difficulty in walking, not elsewhere classified: Secondary | ICD-10-CM | POA: Diagnosis not present

## 2015-11-27 DIAGNOSIS — M25561 Pain in right knee: Secondary | ICD-10-CM | POA: Diagnosis not present

## 2015-11-27 DIAGNOSIS — M6281 Muscle weakness (generalized): Secondary | ICD-10-CM | POA: Diagnosis not present

## 2015-11-27 DIAGNOSIS — M25661 Stiffness of right knee, not elsewhere classified: Secondary | ICD-10-CM | POA: Diagnosis not present

## 2015-11-27 DIAGNOSIS — R262 Difficulty in walking, not elsewhere classified: Secondary | ICD-10-CM | POA: Diagnosis not present

## 2015-12-02 DIAGNOSIS — M25661 Stiffness of right knee, not elsewhere classified: Secondary | ICD-10-CM | POA: Diagnosis not present

## 2015-12-02 DIAGNOSIS — R262 Difficulty in walking, not elsewhere classified: Secondary | ICD-10-CM | POA: Diagnosis not present

## 2015-12-02 DIAGNOSIS — M6281 Muscle weakness (generalized): Secondary | ICD-10-CM | POA: Diagnosis not present

## 2015-12-02 DIAGNOSIS — M25561 Pain in right knee: Secondary | ICD-10-CM | POA: Diagnosis not present

## 2015-12-04 DIAGNOSIS — R262 Difficulty in walking, not elsewhere classified: Secondary | ICD-10-CM | POA: Diagnosis not present

## 2015-12-04 DIAGNOSIS — M25561 Pain in right knee: Secondary | ICD-10-CM | POA: Diagnosis not present

## 2015-12-04 DIAGNOSIS — M6281 Muscle weakness (generalized): Secondary | ICD-10-CM | POA: Diagnosis not present

## 2015-12-04 DIAGNOSIS — M25661 Stiffness of right knee, not elsewhere classified: Secondary | ICD-10-CM | POA: Diagnosis not present

## 2015-12-04 DIAGNOSIS — Z23 Encounter for immunization: Secondary | ICD-10-CM | POA: Diagnosis not present

## 2015-12-06 ENCOUNTER — Encounter: Payer: Self-pay | Admitting: Cardiology

## 2015-12-06 NOTE — Progress Notes (Signed)
Cardiology Office Note    Date:  12/08/2015   ID:  Molly Taylor, DOB 10/18/1926, MRN [id number]  PCP:  Donnie Coffin, MD  Cardiologist:  Fransico Him, MD   Chief Complaint  Patient presents with  . Atrial Fibrillation  . Hypertension    History of Present Illness:  Molly Taylor is a 82 y.o. female with a history of HTN, PAF, tachybrady syndrome s/p PPM and chronic systemic anticoagulation presents today for followup. Since she saw me last, she has had a right TKR and is doing well.  She denies any chest pain or pressure. She denies any palpitations, SOB, DOE, dizziness, syncope.  She has chronic LE edema on occasion and more so in the RLE since her knee surgery.    Past Medical History:  Diagnosis Date  . Allergic rhinitis, cause unspecified   . Chronic anticoagulation   . Family history of adverse reaction to anesthesia    all children - PONV  . Generalized osteoarthrosis, unspecified site   . GERD (gastroesophageal reflux disease)    pt. denies  . Hemorrhage of rectum and anus   . History of bronchitis   . HTN (hypertension)   . Hypercholesteremia    pt. denies  . Hypertonicity of bladder   . PAF (paroxysmal atrial fibrillation) (HCC) paf   CHADS2VASC score is 4  . Pancreatitis, gallstone   . PONV (postoperative nausea and vomiting)   . Presence of permanent cardiac pacemaker   . Renal cell cancer ([place])    pt. denies  . Tachy-brady syndrome (HCC)    s/p PPM  . Urinary incontinence   . Wears glasses    reading  . Wears partial dentures    upper partial    Past Surgical History:  Procedure Laterality Date  . ABDOMINAL HYSTERECTOMY    . COLONOSCOPY    . LAPAROSCOPIC CHOLECYSTECTOMY  2007   lap choli  . left knee replacement - Dr. French Ana  2012   lt total knee  . PACEMAKER GENERATOR CHANGE  04/2013   MDT ADDRL1 pacemaker implanted by Dr Lovena Le  . PACEMAKER INSERTION  2007  . PERMANENT PACEMAKER GENERATOR CHANGE N/A 05/03/2013   Procedure:  PERMANENT PACEMAKER GENERATOR CHANGE;  Surgeon: Evans Lance, MD;  Location: [facility] CATH LAB;  Service: Cardiovascular;  Laterality: N/A;  . PROXIMAL INTERPHALANGEAL FUSION (PIP) Right 08/22/2013   Procedure: EXCISION MUCOID CYST DEBRIDEMENT PROXIMAL INTERPHALANGEAL JOINT;  Surgeon: Wynonia Sours, MD;  Location: [facility];  Service: Orthopedics;  Laterality: Right;  . THUMB ARTHROSCOPY  2007   right  . TOTAL ABDOMINAL HYSTERECTOMY W/ BILATERAL SALPINGOOPHORECTOMY    . TOTAL KNEE ARTHROPLASTY Right 10/03/2015   Procedure: RIGHT TOTAL KNEE ARTHROPLASTY;  Surgeon: Earlie Server, MD;  Location: [facility];  Service: Orthopedics;  Laterality: Right;  . TUBAL LIGATION    . VEIN LIGATION      Current Medications: Outpatient Medications Prior to Visit  Medication Sig Dispense Refill  . acetaminophen (TYLENOL) 650 MG CR tablet Take 650 mg by mouth every 8 (eight) hours as needed for pain.    Marland Kitchen amiodarone (PACERONE) 200 MG tablet Take 1 tablet (200 mg total) by mouth daily. 90 tablet 3  . apixaban (ELIQUIS) 5 MG TABS tablet Take 1 tablet (5 mg total) by mouth 2 (two) times daily. These are correct directions. Last rx said 1 po daily. Cannot refill till 11/15/2015 60 tablet 11  . conjugated estrogens (PREMARIN) vaginal cream Place 1 Applicatorful vaginally  2 (two) times a week.    . loratadine (CLARITIN) 10 MG tablet Take 10 mg by mouth daily as needed for allergies.     . metoprolol succinate (TOPROL-XL) 25 MG 24 hr tablet Take 1 tablet (25 mg total) by mouth daily. 90 tablet 3  . cephALEXin (KEFLEX) 500 MG capsule Take 1 capsule (500 mg total) by mouth 2 (two) times daily. 14 capsule 0  . diltiazem (CARDIZEM CD) 240 MG 24 hr capsule Take 1 capsule by mouth once daily 30 capsule 9  . HYDROcodone-acetaminophen (NORCO) 5-325 MG tablet Take 1-2 tablets by mouth every 4 (four) hours as needed for moderate pain. 80 tablet 0  . Menthol, Topical Analgesic, (BIOFREEZE) 4 % GEL Apply 1 application  topically 2 (two) times daily. Apply to her leg and knee for pain. Stop date 10/20/15    . methocarbamol (ROBAXIN) 500 MG tablet Take 500 mg by mouth every 8 (eight) hours as needed for muscle spasms.    . multivitamin-iron-minerals-folic acid (CENTRUM) chewable tablet Chew 1 tablet by mouth 3 (three) times a week.     . ondansetron (ZOFRAN ODT) 4 MG disintegrating tablet 4mg  ODT q4 hours prn nausea/vomit 10 tablet 0  . ondansetron (ZOFRAN) 4 MG tablet Take 4 mg by mouth every 6 (six) hours as needed for nausea or vomiting.    Marland Kitchen oxyCODONE-acetaminophen (PERCOCET/ROXICET) 5-325 MG tablet Take 1 tablet by mouth every 4 (four) hours as needed for moderate pain. 180 tablet 0  . pseudoephedrine-guaifenesin (MUCINEX D) 60-600 MG per tablet Take 1 tablet by mouth 2 (two) times daily as needed for congestion.      No facility-administered medications prior to visit.      Allergies:   Sulfa antibiotics and Codeine   Social History   Social History  . Marital status: Married    Spouse name: N/A  . Number of children: N/A  . Years of education: N/A   Social History Main Topics  . Smoking status: Never Smoker  . Smokeless tobacco: Never Used  . Alcohol use No  . Drug use: No  . Sexual activity: Not Asked   Other Topics Concern  . None   Social History Narrative  . None     Family History:  The patient's family history is not on file.   ROS:   Please see the history of present illness.    ROS All other systems reviewed and are negative.  No flowsheet data found.     PHYSICAL EXAM:   VS:  BP 100/70   Pulse 71   Ht 5\' 4"  (1.626 m)   Wt 157 lb (71.2 kg)   SpO2 95%   BMI 26.95 kg/m    GEN: Well nourished, well developed, in no acute distress  HEENT: normal  Neck: no JVD, carotid bruits, or masses Cardiac: RRR; no murmurs, rubs, or gallops,no edema.  Intact distal pulses bilaterally.  Respiratory:  clear to auscultation bilaterally, normal work of breathing GI: soft,  nontender, nondistended, + BS MS: no deformity or atrophy  Skin: warm and dry, no rash Neuro:  Alert and Oriented x 3, Strength and sensation are intact Psych: euthymic mood, full affect  Wt Readings from Last 3 Encounters:  12/08/15 157 lb (71.2 kg)  10/19/15 156 lb (70.8 kg)  10/14/15 161 lb 3.2 oz (73.1 kg)      Studies/Labs Reviewed:   EKG:  EKG is not ordered today.   Recent Labs: 10/19/2015: ALT 16; BUN 15; Creatinine, Ser  0.85; Hemoglobin 12.8; Platelets 293; Potassium 3.9; Sodium 139   Lipid Panel No results found for: CHOL, TRIG, HDL, CHOLHDL, VLDL, LDLCALC, LDLDIRECT  Additional studies/ records that were reviewed today include:  none    ASSESSMENT:    1. Paroxysmal atrial fibrillation (HCC)   2. Essential hypertension   3. Tachy-brady syndrome ([place])      PLAN:  In order of problems listed above:  1. PAF maintaining NSR on Amio.  Continue apixaban/amio/BB and cardizem.Marland Kitchen  Recent CBC and BMET stable.  Recent LFTs normal.  Will check TSH and PFTs.  She was encouraged to get a yearly eye exam due to chronic Amio use. 2. HTN - BP controlled on current meds.  Continue BB and CCB. 3. Tachybrady syndrome s/p PPM - followed in pacer clinic.     Medication Adjustments/Labs and Tests Ordered: Current medicines are reviewed at length with the patient today.  Concerns regarding medicines are outlined above.  Medication changes, Labs and Tests ordered today are listed in the Patient Instructions below.  There are no Patient Instructions on file for this visit.   Signed, Fransico Him, MD  12/08/2015 10:19 AM    [facility] [address], [place], [place]  [address] Phone: [phone]; Fax: [phone]

## 2015-12-08 ENCOUNTER — Ambulatory Visit (INDEPENDENT_AMBULATORY_CARE_PROVIDER_SITE_OTHER): Payer: Medicare Other | Admitting: Cardiology

## 2015-12-08 ENCOUNTER — Encounter: Payer: Self-pay | Admitting: Cardiology

## 2015-12-08 VITALS — BP 100/70 | HR 71 | Ht 64.0 in | Wt 157.0 lb

## 2015-12-08 DIAGNOSIS — I495 Sick sinus syndrome: Secondary | ICD-10-CM

## 2015-12-08 DIAGNOSIS — I10 Essential (primary) hypertension: Secondary | ICD-10-CM

## 2015-12-08 DIAGNOSIS — R262 Difficulty in walking, not elsewhere classified: Secondary | ICD-10-CM | POA: Diagnosis not present

## 2015-12-08 DIAGNOSIS — M25661 Stiffness of right knee, not elsewhere classified: Secondary | ICD-10-CM | POA: Diagnosis not present

## 2015-12-08 DIAGNOSIS — I480 Paroxysmal atrial fibrillation: Secondary | ICD-10-CM

## 2015-12-08 DIAGNOSIS — M25561 Pain in right knee: Secondary | ICD-10-CM | POA: Diagnosis not present

## 2015-12-08 DIAGNOSIS — M6281 Muscle weakness (generalized): Secondary | ICD-10-CM | POA: Diagnosis not present

## 2015-12-08 DIAGNOSIS — I739 Peripheral vascular disease, unspecified: Secondary | ICD-10-CM | POA: Diagnosis not present

## 2015-12-08 DIAGNOSIS — L603 Nail dystrophy: Secondary | ICD-10-CM | POA: Diagnosis not present

## 2015-12-08 LAB — TSH: TSH: 1.26 mIU/L

## 2015-12-08 NOTE — Patient Instructions (Signed)
Medication Instructions:  None  Labwork: TSH today  Testing/Procedures: Your physician has recommended that you have a pulmonary function test. Pulmonary Function Tests are a group of tests that measure how well air moves in and out of your lungs.   Follow-Up: Your physician wants you to follow-up in: 1 year with Dr. Radford Pax. You will receive a reminder letter in the mail two months in advance. If you don't receive a letter, please call our office to schedule the follow-up appointment.   Any Other Special Instructions Will Be Listed Below (If Applicable).     If you need a refill on your cardiac medications before your next appointment, please call your pharmacy.

## 2015-12-09 DIAGNOSIS — R350 Frequency of micturition: Secondary | ICD-10-CM | POA: Diagnosis not present

## 2015-12-09 DIAGNOSIS — R351 Nocturia: Secondary | ICD-10-CM | POA: Diagnosis not present

## 2015-12-10 ENCOUNTER — Other Ambulatory Visit: Payer: Self-pay | Admitting: *Deleted

## 2015-12-10 DIAGNOSIS — I480 Paroxysmal atrial fibrillation: Secondary | ICD-10-CM

## 2015-12-10 DIAGNOSIS — M25661 Stiffness of right knee, not elsewhere classified: Secondary | ICD-10-CM | POA: Diagnosis not present

## 2015-12-10 DIAGNOSIS — M6281 Muscle weakness (generalized): Secondary | ICD-10-CM | POA: Diagnosis not present

## 2015-12-10 DIAGNOSIS — M25561 Pain in right knee: Secondary | ICD-10-CM | POA: Diagnosis not present

## 2015-12-10 DIAGNOSIS — R262 Difficulty in walking, not elsewhere classified: Secondary | ICD-10-CM | POA: Diagnosis not present

## 2015-12-10 MED ORDER — AMIODARONE HCL 200 MG PO TABS: 200.0000 mg | ORAL_TABLET | Freq: Every day | ORAL | 3 refills | Status: DC

## 2015-12-10 MED ORDER — CARTIA XT 240 MG PO CP24: 240.0000 mg | ORAL_CAPSULE | Freq: Every day | ORAL | 3 refills | Status: DC

## 2015-12-10 MED ORDER — METOPROLOL SUCCINATE ER 25 MG PO TB24: 25.0000 mg | ORAL_TABLET | Freq: Every day | ORAL | 3 refills | Status: DC

## 2015-12-11 DIAGNOSIS — M25561 Pain in right knee: Secondary | ICD-10-CM | POA: Diagnosis not present

## 2015-12-17 DIAGNOSIS — M25561 Pain in right knee: Secondary | ICD-10-CM | POA: Diagnosis not present

## 2015-12-17 DIAGNOSIS — M6281 Muscle weakness (generalized): Secondary | ICD-10-CM | POA: Diagnosis not present

## 2015-12-17 DIAGNOSIS — M25661 Stiffness of right knee, not elsewhere classified: Secondary | ICD-10-CM | POA: Diagnosis not present

## 2015-12-17 DIAGNOSIS — R262 Difficulty in walking, not elsewhere classified: Secondary | ICD-10-CM | POA: Diagnosis not present

## 2015-12-23 DIAGNOSIS — M6281 Muscle weakness (generalized): Secondary | ICD-10-CM | POA: Diagnosis not present

## 2015-12-23 DIAGNOSIS — N3946 Mixed incontinence: Secondary | ICD-10-CM | POA: Diagnosis not present

## 2015-12-23 DIAGNOSIS — R3915 Urgency of urination: Secondary | ICD-10-CM | POA: Diagnosis not present

## 2015-12-23 DIAGNOSIS — R278 Other lack of coordination: Secondary | ICD-10-CM | POA: Diagnosis not present

## 2015-12-25 ENCOUNTER — Telehealth: Payer: Self-pay | Admitting: Cardiology

## 2015-12-25 ENCOUNTER — Ambulatory Visit (INDEPENDENT_AMBULATORY_CARE_PROVIDER_SITE_OTHER): Payer: Medicare Other | Admitting: *Deleted

## 2015-12-25 DIAGNOSIS — I495 Sick sinus syndrome: Secondary | ICD-10-CM

## 2015-12-25 NOTE — Telephone Encounter (Signed)
LMOVM reminding pt to send remote transmission.   

## 2015-12-25 NOTE — Progress Notes (Signed)
Remote pacemaker transmission.   

## 2015-12-26 ENCOUNTER — Encounter: Payer: Self-pay | Admitting: Cardiology

## 2016-01-01 DIAGNOSIS — R350 Frequency of micturition: Secondary | ICD-10-CM | POA: Diagnosis not present

## 2016-01-01 DIAGNOSIS — N3941 Urge incontinence: Secondary | ICD-10-CM | POA: Diagnosis not present

## 2016-01-01 DIAGNOSIS — M6281 Muscle weakness (generalized): Secondary | ICD-10-CM | POA: Diagnosis not present

## 2016-01-01 DIAGNOSIS — R278 Other lack of coordination: Secondary | ICD-10-CM | POA: Diagnosis not present

## 2016-01-04 LAB — CUP PACEART REMOTE DEVICE CHECK
Battery Impedance: 184 Ohm
Battery Remaining Longevity: 110 mo
Brady Statistic AP VP Percent: 98 %
Brady Statistic AP VS Percent: 0 %
Brady Statistic AS VP Percent: 1 %
Brady Statistic AS VS Percent: 1 %
Implantable Lead Implant Date: 20070313
Implantable Lead Implant Date: 20070313
Implantable Lead Location: 753859
Implantable Lead Model: 5092
Lead Channel Impedance Value: 522 Ohm
Lead Channel Impedance Value: 636 Ohm
Lead Channel Pacing Threshold Amplitude: 0.75 V
Lead Channel Pacing Threshold Amplitude: 0.75 V
Lead Channel Pacing Threshold Pulse Width: 0.4 ms
Lead Channel Setting Pacing Amplitude: 2.5 V
MDC IDC LEAD LOCATION: 753860
MDC IDC MSMT BATTERY VOLTAGE: 2.78 V
MDC IDC MSMT LEADCHNL RV PACING THRESHOLD PULSEWIDTH: 0.4 ms
MDC IDC SESS DTM: 20171005185039
MDC IDC SET LEADCHNL RA PACING AMPLITUDE: 2 V
MDC IDC SET LEADCHNL RV PACING PULSEWIDTH: 0.4 ms
MDC IDC SET LEADCHNL RV SENSING SENSITIVITY: 5.6 mV

## 2016-01-16 DIAGNOSIS — I480 Paroxysmal atrial fibrillation: Secondary | ICD-10-CM | POA: Diagnosis not present

## 2016-01-16 DIAGNOSIS — J309 Allergic rhinitis, unspecified: Secondary | ICD-10-CM | POA: Diagnosis not present

## 2016-01-16 DIAGNOSIS — N3281 Overactive bladder: Secondary | ICD-10-CM | POA: Diagnosis not present

## 2016-01-16 DIAGNOSIS — I10 Essential (primary) hypertension: Secondary | ICD-10-CM | POA: Diagnosis not present

## 2016-01-16 DIAGNOSIS — M150 Primary generalized (osteo)arthritis: Secondary | ICD-10-CM | POA: Diagnosis not present

## 2016-01-20 ENCOUNTER — Encounter (INDEPENDENT_AMBULATORY_CARE_PROVIDER_SITE_OTHER): Payer: Medicare Other | Admitting: Internal Medicine

## 2016-01-20 DIAGNOSIS — I480 Paroxysmal atrial fibrillation: Secondary | ICD-10-CM

## 2016-01-20 DIAGNOSIS — I10 Essential (primary) hypertension: Secondary | ICD-10-CM

## 2016-01-20 DIAGNOSIS — I495 Sick sinus syndrome: Secondary | ICD-10-CM

## 2016-01-20 LAB — PULMONARY FUNCTION TEST
DL/VA % pred: 56 %
DL/VA: 2.73 ml/min/mmHg/L
DLCO UNC: 13.15 ml/min/mmHg
DLCO cor % pred: 51 %
DLCO cor: 12.57 ml/min/mmHg
DLCO unc % pred: 54 %
FEF 25-75 PRE: 1.74 L/s
FEF 25-75 Post: 2.39 L/sec
FEF2575-%Change-Post: 37 %
FEF2575-%PRED-POST: 227 %
FEF2575-%Pred-Pre: 165 %
FEV1-%CHANGE-POST: 7 %
FEV1-%PRED-POST: 158 %
FEV1-%PRED-PRE: 146 %
FEV1-Post: 2.15 L
FEV1-Pre: 2 L
FEV1FVC-%Change-Post: 6 %
FEV1FVC-%Pred-Pre: 104 %
FEV6-%CHANGE-POST: 3 %
FEV6-%PRED-POST: 157 %
FEV6-%Pred-Pre: 152 %
FEV6-POST: 2.62 L
FEV6-PRE: 2.55 L
FEV6FVC-%CHANGE-POST: 1 %
FEV6FVC-%PRED-POST: 105 %
FEV6FVC-%PRED-PRE: 103 %
FVC-%CHANGE-POST: 1 %
FVC-%Pred-Post: 149 %
FVC-%Pred-Pre: 147 %
FVC-Post: 2.62 L
FVC-Pre: 2.58 L
POST FEV6/FVC RATIO: 100 %
PRE FEV6/FVC RATIO: 99 %
Post FEV1/FVC ratio: 82 %
Pre FEV1/FVC ratio: 77 %
RV % PRED: 86 %
RV: 2.19 L
TLC % PRED: 91 %
TLC: 4.64 L

## 2016-01-21 DIAGNOSIS — M6281 Muscle weakness (generalized): Secondary | ICD-10-CM | POA: Diagnosis not present

## 2016-01-21 DIAGNOSIS — N3941 Urge incontinence: Secondary | ICD-10-CM | POA: Diagnosis not present

## 2016-01-21 DIAGNOSIS — R350 Frequency of micturition: Secondary | ICD-10-CM | POA: Diagnosis not present

## 2016-01-21 DIAGNOSIS — R278 Other lack of coordination: Secondary | ICD-10-CM | POA: Diagnosis not present

## 2016-03-08 DIAGNOSIS — M79675 Pain in left toe(s): Secondary | ICD-10-CM | POA: Diagnosis not present

## 2016-03-08 DIAGNOSIS — L603 Nail dystrophy: Secondary | ICD-10-CM | POA: Diagnosis not present

## 2016-03-08 DIAGNOSIS — B351 Tinea unguium: Secondary | ICD-10-CM | POA: Diagnosis not present

## 2016-03-08 DIAGNOSIS — M79674 Pain in right toe(s): Secondary | ICD-10-CM | POA: Diagnosis not present

## 2016-03-08 DIAGNOSIS — I739 Peripheral vascular disease, unspecified: Secondary | ICD-10-CM | POA: Diagnosis not present

## 2016-03-25 ENCOUNTER — Ambulatory Visit (INDEPENDENT_AMBULATORY_CARE_PROVIDER_SITE_OTHER): Payer: Medicare Other | Admitting: *Deleted

## 2016-03-25 ENCOUNTER — Telehealth: Payer: Self-pay | Admitting: Cardiology

## 2016-03-25 DIAGNOSIS — I495 Sick sinus syndrome: Secondary | ICD-10-CM

## 2016-03-25 NOTE — Telephone Encounter (Signed)
LMOVM reminding pt to send remote transmission.   

## 2016-03-26 ENCOUNTER — Encounter: Payer: Self-pay | Admitting: Cardiology

## 2016-03-26 NOTE — Progress Notes (Signed)
Remote pacemaker transmission.   

## 2016-03-26 NOTE — Progress Notes (Signed)
Letter  

## 2016-04-13 LAB — CUP PACEART REMOTE DEVICE CHECK
Battery Impedance: 208 Ohm
Battery Remaining Longevity: 105 mo
Battery Voltage: 2.78 V
Brady Statistic AP VP Percent: 98 %
Brady Statistic AP VS Percent: 0 %
Brady Statistic AS VP Percent: 1 %
Implantable Lead Implant Date: 20070313
Implantable Lead Location: 753859
Implantable Lead Model: 5076
Implantable Lead Model: 5092
Implantable Pulse Generator Implant Date: 20150212
Lead Channel Impedance Value: 500 Ohm
Lead Channel Pacing Threshold Amplitude: 0.75 V
Lead Channel Pacing Threshold Amplitude: 1 V
Lead Channel Setting Pacing Amplitude: 2 V
Lead Channel Setting Pacing Amplitude: 2.5 V
Lead Channel Setting Pacing Pulse Width: 0.4 ms
MDC IDC LEAD IMPLANT DT: 20070313
MDC IDC LEAD LOCATION: 753860
MDC IDC MSMT LEADCHNL RA PACING THRESHOLD PULSEWIDTH: 0.4 ms
MDC IDC MSMT LEADCHNL RV IMPEDANCE VALUE: 638 Ohm
MDC IDC MSMT LEADCHNL RV PACING THRESHOLD PULSEWIDTH: 0.4 ms
MDC IDC SESS DTM: 20180105193047
MDC IDC SET LEADCHNL RV SENSING SENSITIVITY: 5.6 mV
MDC IDC STAT BRADY AS VS PERCENT: 0 %

## 2016-04-20 ENCOUNTER — Telehealth: Payer: Self-pay

## 2016-04-21 NOTE — Telephone Encounter (Signed)
Spoke to dentist about dental procedure for patient and recommended holding Eliquis for 1-2 days prior to dental extraction with restart when ok from bleeding risk. GT

## 2016-05-04 DIAGNOSIS — Z961 Presence of intraocular lens: Secondary | ICD-10-CM | POA: Diagnosis not present

## 2016-05-04 DIAGNOSIS — H5212 Myopia, left eye: Secondary | ICD-10-CM | POA: Diagnosis not present

## 2016-05-31 DIAGNOSIS — I739 Peripheral vascular disease, unspecified: Secondary | ICD-10-CM | POA: Diagnosis not present

## 2016-05-31 DIAGNOSIS — M79674 Pain in right toe(s): Secondary | ICD-10-CM | POA: Diagnosis not present

## 2016-05-31 DIAGNOSIS — L603 Nail dystrophy: Secondary | ICD-10-CM | POA: Diagnosis not present

## 2016-05-31 DIAGNOSIS — B351 Tinea unguium: Secondary | ICD-10-CM | POA: Diagnosis not present

## 2016-05-31 DIAGNOSIS — M79675 Pain in left toe(s): Secondary | ICD-10-CM | POA: Diagnosis not present

## 2016-06-24 ENCOUNTER — Telehealth: Payer: Self-pay | Admitting: Cardiology

## 2016-06-24 ENCOUNTER — Ambulatory Visit (INDEPENDENT_AMBULATORY_CARE_PROVIDER_SITE_OTHER): Payer: Medicare Other | Admitting: *Deleted

## 2016-06-24 DIAGNOSIS — I495 Sick sinus syndrome: Secondary | ICD-10-CM | POA: Diagnosis not present

## 2016-06-24 NOTE — Telephone Encounter (Signed)
Spoke with pt and reminded pt of remote transmission that is due today. Pt verbalized understanding.   

## 2016-06-25 ENCOUNTER — Encounter: Payer: Self-pay | Admitting: Cardiology

## 2016-06-25 LAB — CUP PACEART REMOTE DEVICE CHECK
Battery Impedance: 232 Ohm
Brady Statistic AP VP Percent: 99 %
Brady Statistic AP VS Percent: 0 %
Brady Statistic AS VS Percent: 0 %
Date Time Interrogation Session: 20180405193427
Implantable Lead Implant Date: 20070313
Implantable Lead Location: 753859
Implantable Lead Model: 5076
Lead Channel Impedance Value: 615 Ohm
Lead Channel Pacing Threshold Amplitude: 1 V
Lead Channel Pacing Threshold Pulse Width: 0.4 ms
Lead Channel Setting Pacing Amplitude: 2.5 V
Lead Channel Setting Sensing Sensitivity: 5.6 mV
MDC IDC LEAD IMPLANT DT: 20070313
MDC IDC LEAD LOCATION: 753860
MDC IDC MSMT BATTERY REMAINING LONGEVITY: 101 mo
MDC IDC MSMT BATTERY VOLTAGE: 2.78 V
MDC IDC MSMT LEADCHNL RA IMPEDANCE VALUE: 500 Ohm
MDC IDC MSMT LEADCHNL RA PACING THRESHOLD AMPLITUDE: 0.75 V
MDC IDC MSMT LEADCHNL RV PACING THRESHOLD PULSEWIDTH: 0.4 ms
MDC IDC PG IMPLANT DT: 20150212
MDC IDC SET LEADCHNL RA PACING AMPLITUDE: 2 V
MDC IDC SET LEADCHNL RV PACING PULSEWIDTH: 0.4 ms
MDC IDC STAT BRADY AS VP PERCENT: 1 %

## 2016-06-25 NOTE — Progress Notes (Signed)
Letter  

## 2016-06-25 NOTE — Progress Notes (Signed)
Remote pacemaker transmission.   

## 2016-07-13 ENCOUNTER — Ambulatory Visit: Payer: Self-pay | Admitting: Pharmacist

## 2016-07-13 DIAGNOSIS — Z5181 Encounter for therapeutic drug level monitoring: Secondary | ICD-10-CM

## 2016-08-06 ENCOUNTER — Telehealth (HOSPITAL_COMMUNITY): Payer: Self-pay | Admitting: Cardiology

## 2016-08-06 NOTE — Telephone Encounter (Signed)
08/06/2016 02:01 PM Phone ([place]) Molly Taylor (Self) [phone] (H)   Left Message - Called pt and lmsg for her to CB to get scheduled for echo in July.    By Verdene Rio

## 2016-08-27 DIAGNOSIS — I739 Peripheral vascular disease, unspecified: Secondary | ICD-10-CM | POA: Diagnosis not present

## 2016-08-27 DIAGNOSIS — M79675 Pain in left toe(s): Secondary | ICD-10-CM | POA: Diagnosis not present

## 2016-08-27 DIAGNOSIS — M79674 Pain in right toe(s): Secondary | ICD-10-CM | POA: Diagnosis not present

## 2016-08-27 DIAGNOSIS — B351 Tinea unguium: Secondary | ICD-10-CM | POA: Diagnosis not present

## 2016-08-27 DIAGNOSIS — L603 Nail dystrophy: Secondary | ICD-10-CM | POA: Diagnosis not present

## 2016-09-06 ENCOUNTER — Other Ambulatory Visit: Payer: Self-pay | Admitting: Physician Assistant

## 2016-09-06 ENCOUNTER — Ambulatory Visit
Admission: RE | Admit: 2016-09-06 | Discharge: 2016-09-06 | Disposition: A | Discharge status: Home / Self Care | Payer: Medicare Other | Source: Ambulatory Visit | Attending: Physician Assistant | Admitting: Physician Assistant

## 2016-09-06 DIAGNOSIS — R42 Dizziness and giddiness: Secondary | ICD-10-CM | POA: Diagnosis not present

## 2016-09-06 DIAGNOSIS — R059 Cough, unspecified: Secondary | ICD-10-CM

## 2016-09-06 DIAGNOSIS — K219 Gastro-esophageal reflux disease without esophagitis: Secondary | ICD-10-CM | POA: Diagnosis not present

## 2016-09-06 DIAGNOSIS — R05 Cough: Secondary | ICD-10-CM | POA: Diagnosis not present

## 2016-09-06 DIAGNOSIS — I10 Essential (primary) hypertension: Secondary | ICD-10-CM | POA: Diagnosis not present

## 2016-09-16 ENCOUNTER — Ambulatory Visit
Admission: RE | Admit: 2016-09-16 | Discharge: 2016-09-16 | Disposition: A | Discharge status: Home / Self Care | Payer: Medicare Other | Source: Ambulatory Visit | Attending: Family Medicine | Admitting: Family Medicine

## 2016-09-16 ENCOUNTER — Other Ambulatory Visit: Payer: Self-pay | Admitting: Family Medicine

## 2016-09-16 DIAGNOSIS — R0609 Other forms of dyspnea: Secondary | ICD-10-CM | POA: Diagnosis not present

## 2016-09-16 DIAGNOSIS — R05 Cough: Secondary | ICD-10-CM | POA: Diagnosis not present

## 2016-09-16 DIAGNOSIS — J189 Pneumonia, unspecified organism: Secondary | ICD-10-CM | POA: Diagnosis not present

## 2016-09-19 ENCOUNTER — Encounter (HOSPITAL_COMMUNITY): Payer: Self-pay | Admitting: Emergency Medicine

## 2016-09-19 ENCOUNTER — Inpatient Hospital Stay (HOSPITAL_COMMUNITY)
Admission: EM | Admit: 2016-09-19 | Discharge: 2016-09-24 | DRG: 193 | Disposition: A | Discharge status: Home Health | Payer: Medicare Other | Attending: Internal Medicine | Admitting: Internal Medicine

## 2016-09-19 ENCOUNTER — Emergency Department (HOSPITAL_COMMUNITY): Payer: Medicare Other

## 2016-09-19 DIAGNOSIS — R059 Cough, unspecified: Secondary | ICD-10-CM

## 2016-09-19 DIAGNOSIS — Z96653 Presence of artificial knee joint, bilateral: Secondary | ICD-10-CM | POA: Diagnosis present

## 2016-09-19 DIAGNOSIS — R0602 Shortness of breath: Secondary | ICD-10-CM

## 2016-09-19 DIAGNOSIS — R1314 Dysphagia, pharyngoesophageal phase: Secondary | ICD-10-CM | POA: Diagnosis present

## 2016-09-19 DIAGNOSIS — I495 Sick sinus syndrome: Secondary | ICD-10-CM | POA: Diagnosis present

## 2016-09-19 DIAGNOSIS — I10 Essential (primary) hypertension: Secondary | ICD-10-CM | POA: Diagnosis present

## 2016-09-19 DIAGNOSIS — J189 Pneumonia, unspecified organism: Secondary | ICD-10-CM | POA: Diagnosis present

## 2016-09-19 DIAGNOSIS — B370 Candidal stomatitis: Secondary | ICD-10-CM | POA: Diagnosis not present

## 2016-09-19 DIAGNOSIS — R05 Cough: Secondary | ICD-10-CM | POA: Diagnosis not present

## 2016-09-19 DIAGNOSIS — I951 Orthostatic hypotension: Secondary | ICD-10-CM | POA: Diagnosis present

## 2016-09-19 DIAGNOSIS — I480 Paroxysmal atrial fibrillation: Secondary | ICD-10-CM | POA: Diagnosis present

## 2016-09-19 DIAGNOSIS — J40 Bronchitis, not specified as acute or chronic: Secondary | ICD-10-CM | POA: Diagnosis present

## 2016-09-19 DIAGNOSIS — Z7901 Long term (current) use of anticoagulants: Secondary | ICD-10-CM

## 2016-09-19 DIAGNOSIS — Z79899 Other long term (current) drug therapy: Secondary | ICD-10-CM

## 2016-09-19 DIAGNOSIS — J181 Lobar pneumonia, unspecified organism: Secondary | ICD-10-CM | POA: Diagnosis present

## 2016-09-19 DIAGNOSIS — Z950 Presence of cardiac pacemaker: Secondary | ICD-10-CM | POA: Diagnosis present

## 2016-09-19 DIAGNOSIS — J690 Pneumonitis due to inhalation of food and vomit: Principal | ICD-10-CM | POA: Diagnosis present

## 2016-09-19 DIAGNOSIS — K219 Gastro-esophageal reflux disease without esophagitis: Secondary | ICD-10-CM | POA: Diagnosis present

## 2016-09-19 DIAGNOSIS — J9601 Acute respiratory failure with hypoxia: Secondary | ICD-10-CM | POA: Diagnosis present

## 2016-09-19 DIAGNOSIS — I77810 Thoracic aortic ectasia: Secondary | ICD-10-CM | POA: Diagnosis present

## 2016-09-19 DIAGNOSIS — J9801 Acute bronchospasm: Secondary | ICD-10-CM | POA: Diagnosis present

## 2016-09-19 LAB — CBC WITH DIFFERENTIAL/PLATELET
BASOS ABS: 0 10*3/uL (ref 0.0–0.1)
BASOS PCT: 0 %
Eosinophils Absolute: 0.3 10*3/uL (ref 0.0–0.7)
Eosinophils Relative: 5 %
HEMATOCRIT: 38.1 % (ref 36.0–46.0)
Hemoglobin: 13.6 g/dL (ref 12.0–15.0)
LYMPHS PCT: 13 %
Lymphs Abs: 0.9 10*3/uL (ref 0.7–4.0)
MCH: 32 pg (ref 26.0–34.0)
MCHC: 35.7 g/dL (ref 30.0–36.0)
MCV: 89.6 fL (ref 78.0–100.0)
Monocytes Absolute: 0.5 10*3/uL (ref 0.1–1.0)
Monocytes Relative: 8 %
NEUTROS ABS: 5 10*3/uL (ref 1.7–7.7)
NEUTROS PCT: 74 %
Platelets: 190 10*3/uL (ref 150–400)
RBC: 4.25 MIL/uL (ref 3.87–5.11)
RDW: 13.7 % (ref 11.5–15.5)
WBC: 6.7 10*3/uL (ref 4.0–10.5)

## 2016-09-19 LAB — COMPREHENSIVE METABOLIC PANEL
ALBUMIN: 3.3 g/dL — AB (ref 3.5–5.0)
ALT: 13 U/L — AB (ref 14–54)
AST: 16 U/L (ref 15–41)
Alkaline Phosphatase: 66 U/L (ref 38–126)
Anion gap: 8 (ref 5–15)
BILIRUBIN TOTAL: 0.9 mg/dL (ref 0.3–1.2)
BUN: 16 mg/dL (ref 6–20)
CHLORIDE: 109 mmol/L (ref 101–111)
CO2: 22 mmol/L (ref 22–32)
CREATININE: 0.89 mg/dL (ref 0.44–1.00)
Calcium: 9.4 mg/dL (ref 8.9–10.3)
GFR calc Af Amer: >60 mL/min (ref >60)
GFR, EST NON AFRICAN AMERICAN: 57 mL/min — AB (ref >60)
GLUCOSE: 113 mg/dL — AB (ref 65–99)
POTASSIUM: 3.6 mmol/L (ref 3.5–5.1)
Sodium: 139 mmol/L (ref 135–145)
Total Protein: 7.3 g/dL (ref 6.5–8.1)

## 2016-09-19 LAB — I-STAT TROPONIN, ED: Troponin i, poc: 0 ng/mL (ref 0.00–0.08)

## 2016-09-19 LAB — I-STAT CG4 LACTIC ACID, ED: LACTIC ACID, VENOUS: 1.08 mmol/L (ref 0.5–1.9)

## 2016-09-19 LAB — BRAIN NATRIURETIC PEPTIDE: B Natriuretic Peptide: 124.1 pg/mL — ABNORMAL HIGH (ref 0.0–100.0)

## 2016-09-19 MED ORDER — LEVOFLOXACIN IN D5W 750 MG/150ML IV SOLN
750.0000 mg | Freq: Once | INTRAVENOUS | Status: AC
  Administered 2016-09-19: 750 mg via INTRAVENOUS
  Filled 2016-09-19: qty 150

## 2016-09-19 NOTE — ED Triage Notes (Signed)
Pt from home with family. Pt was seen by PCP and diagnosed with pneumonia. Pt states she has had SOB x 2 weeks. Pt states she initially was put on a Z pack and then on 6/28 was put on doxycycline and another antibiotic she can not remember the name of. Pt reports SOB following taking antibiotics. Pt has clear lung sounds and is able to maintain her oxygen saturation. Pt is not febrile and is not tachycardic at time of assessment.

## 2016-09-19 NOTE — ED Provider Notes (Signed)
[facility] DEPT Provider Note   CSN: [id number] Arrival date & time: 09/19/16  2100     History   Chief Complaint Chief Complaint  Patient presents with  . Pneumonia    HPI Molly Taylor is a 83 y.o. female with a hx of Known GERD, HTN, paroxysmal A. fib anticoagulated presents to the Emergency Department complaining of gradual, persistent, progressively worsening SOB, weakness and fatigue onset 2 weeks ago but worsening in the last 3 days.  Patient's daughters at bedside report that she was diagnosed with pneumonia via x-ray 2 weeks ago and given azithromycin. She did not significantly improve and had repeat x-ray 4 days ago showing progressive pneumonia. She was started on doxycycline and azithromycin. Family reports that her cough is worse and her fatigue is worse. Family reports some swelling of her lower legs which resolves with elevation. Patient denies chest pain, syncope or near syncope. Nothing seems to make her symptoms better or worse.  The history is provided by the patient and medical records. No language interpreter was used.    Past Medical History:  Diagnosis Date  . Allergic rhinitis, cause unspecified   . Chronic anticoagulation   . Family history of adverse reaction to anesthesia    all children - PONV  . Generalized osteoarthrosis, unspecified site   . GERD (gastroesophageal reflux disease)    pt. denies  . Hemorrhage of rectum and anus   . History of bronchitis   . HTN (hypertension)   . Hypercholesteremia    pt. denies  . Hypertonicity of bladder   . PAF (paroxysmal atrial fibrillation) (HCC) paf   CHADS2VASC score is 4  . Pancreatitis, gallstone   . PONV (postoperative nausea and vomiting)   . Presence of permanent cardiac pacemaker   . Renal cell cancer ([place])    pt. denies  . Tachy-brady syndrome (HCC)    s/p PPM  . Urinary incontinence   . Wears glasses    reading  . Wears partial dentures    upper partial    Patient Active Problem  List   Diagnosis Date Noted  . Pneumonia 09/19/2016  . Primary localized osteoarthritis of right knee 10/03/2015  . Pacemaker 04/24/2013  . SOB (shortness of breath) 04/16/2013  . Encounter for therapeutic drug monitoring 04/12/2013  . Tachy-brady syndrome ([place]) 02/02/2013  . HTN (hypertension)   . Chronic anticoagulation   . Pancreatitis, gallstone   . Atrial fibrillation ([place]) 01/18/2013    Past Surgical History:  Procedure Laterality Date  . ABDOMINAL HYSTERECTOMY    . COLONOSCOPY    . LAPAROSCOPIC CHOLECYSTECTOMY  2007   lap choli  . left knee replacement - Dr. French Ana  2012   lt total knee  . PACEMAKER GENERATOR CHANGE  04/2013   MDT ADDRL1 pacemaker implanted by Dr Lovena Le  . PACEMAKER INSERTION  2007  . PERMANENT PACEMAKER GENERATOR CHANGE N/A 05/03/2013   Procedure: PERMANENT PACEMAKER GENERATOR CHANGE;  Surgeon: Evans Lance, MD;  Location: [facility] CATH LAB;  Service: Cardiovascular;  Laterality: N/A;  . PROXIMAL INTERPHALANGEAL FUSION (PIP) Right 08/22/2013   Procedure: EXCISION MUCOID CYST DEBRIDEMENT PROXIMAL INTERPHALANGEAL JOINT;  Surgeon: Wynonia Sours, MD;  Location: [facility];  Service: Orthopedics;  Laterality: Right;  . THUMB ARTHROSCOPY  2007   right  . TOTAL ABDOMINAL HYSTERECTOMY W/ BILATERAL SALPINGOOPHORECTOMY    . TOTAL KNEE ARTHROPLASTY Right 10/03/2015   Procedure: RIGHT TOTAL KNEE ARTHROPLASTY;  Surgeon: Earlie Server, MD;  Location: [facility];  Service:  Orthopedics;  Laterality: Right;  . TUBAL LIGATION    . VEIN LIGATION      OB History    No data available       Home Medications    Prior to Admission medications   Medication Sig Start Date End Date Taking? Authorizing Provider  acetaminophen (TYLENOL) 650 MG CR tablet Take 650 mg by mouth every 8 (eight) hours as needed for pain.    [provider]  amiodarone (PACERONE) 200 MG tablet Take 1 tablet (200 mg total) by mouth daily. 12/10/15   Sueanne Margarita, MD  apixaban  (ELIQUIS) 5 MG TABS tablet Take 1 tablet (5 mg total) by mouth 2 (two) times daily. These are correct directions. Last rx said 1 po daily. Cannot refill till 11/15/2015 10/20/15   Evans Lance, MD  CARTIA XT 240 MG 24 hr capsule Take 1 capsule (240 mg total) by mouth daily. 12/10/15   Sueanne Margarita, MD  conjugated estrogens (PREMARIN) vaginal cream Place 1 Applicatorful vaginally 2 (two) times a week.    [provider]  loratadine (CLARITIN) 10 MG tablet Take 10 mg by mouth daily as needed for allergies.     [provider]  metoprolol succinate (TOPROL-XL) 25 MG 24 hr tablet Take 1 tablet (25 mg total) by mouth daily. 12/10/15   Sueanne Margarita, MD    Family History No family history on file.  Social History Social History  Substance Use Topics  . Smoking status: Never Smoker  . Smokeless tobacco: Never Used  . Alcohol use No     Allergies   Sulfa antibiotics and Codeine   Review of Systems Review of Systems  Constitutional: Positive for fever. Negative for appetite change, diaphoresis, fatigue and unexpected weight change.  HENT: Negative for mouth sores.   Eyes: Negative for visual disturbance.  Respiratory: Positive for chest tightness and shortness of breath. Negative for cough and wheezing.   Cardiovascular: Negative for chest pain.  Gastrointestinal: Negative for abdominal pain, constipation, diarrhea, nausea and vomiting.  Endocrine: Negative for polydipsia, polyphagia and polyuria.  Genitourinary: Negative for dysuria, frequency, hematuria and urgency.  Musculoskeletal: Negative for back pain, neck pain and neck stiffness.  Skin: Negative for rash.  Allergic/Immunologic: Negative for immunocompromised state.  Neurological: Negative for syncope, light-headedness and headaches.  Hematological: Does not bruise/bleed easily.  Psychiatric/Behavioral: Negative for sleep disturbance. The patient is not nervous/anxious.   All other systems reviewed and are  negative.    Physical Exam Updated Vital Signs BP (!) 141/81   Pulse (!) 59   Temp 98.4 F (36.9 C) (Oral)   Resp 19   Ht 5\' 7"  (1.702 m)   Wt 71.2 kg (157 lb)   SpO2 92%   BMI 24.59 kg/m   Physical Exam  Constitutional: She appears well-developed and well-nourished. No distress.  Awake, alert,  HENT:  Head: Normocephalic and atraumatic.  Mouth/Throat: Oropharynx is clear and moist. No oropharyngeal exudate.  Eyes: Conjunctivae are normal. No scleral icterus.  Neck: Normal range of motion. Neck supple.  Cardiovascular: Normal rate, regular rhythm and intact distal pulses.   Pulmonary/Chest: Effort normal. Tachypnea noted. No respiratory distress. She has no wheezes. She has rhonchi ( bilateral).  Equal chest expansion  Abdominal: Soft. Bowel sounds are normal. She exhibits no mass. There is no tenderness. There is no rebound and no guarding.  Musculoskeletal: Normal range of motion. She exhibits no edema.  Neurological: She is alert.  Speech is clear and  goal oriented Moves extremities without ataxia  Skin: Skin is warm and dry. She is not diaphoretic.  Psychiatric: She has a normal mood and affect.  Nursing note and vitals reviewed.    ED Treatments / Results  Labs (all labs ordered are listed, but only abnormal results are displayed) Labs Reviewed  COMPREHENSIVE METABOLIC PANEL - Abnormal; Notable for the following:       Result Value   Glucose, Bld 113 (*)    Albumin 3.3 (*)    ALT 13 (*)    GFR calc non Af Amer 57 (*)    All other components within normal limits  BRAIN NATRIURETIC PEPTIDE - Abnormal; Notable for the following:    B Natriuretic Peptide 124.1 (*)    All other components within normal limits  CULTURE, BLOOD (ROUTINE X 2)  CULTURE, BLOOD (ROUTINE X 2)  CBC WITH DIFFERENTIAL/PLATELET  URINALYSIS, ROUTINE W REFLEX MICROSCOPIC  I-STAT CG4 LACTIC ACID, ED  I-STAT TROPOININ, ED    EKG  EKG Interpretation  Date/Time:  Sunday September 19 2016  22:39:06 EDT Ventricular Rate:  60 PR Interval:    QRS Duration: 169 QT Interval:  534 QTC Calculation: 534 R Axis:   -58 Text Interpretation:  Atrial-ventricular dual-paced rhythm No further analysis attempted due to paced rhythm No significant change since last tracing Confirmed by Isla Pence [phone]) on 09/19/2016 11:07:52 PM       Radiology Dg Chest 2 View  Result Date: 09/19/2016 CLINICAL DATA:  Acute onset of congested cough and shortness of breath. Initial encounter. EXAM: CHEST  2 VIEW COMPARISON:  Chest radiograph performed 09/16/2016 FINDINGS: The lungs are well-aerated. Patchy bilateral airspace opacities are progressively worsening and concerning for multifocal pneumonia. There is no evidence of pleural effusion or pneumothorax. The heart is mildly enlarged. Mild vascular congestion is noted. A pacemaker is noted at the left chest wall, with leads ending at the right atrium and right ventricle. No acute osseous abnormalities are seen. IMPRESSION: 1. Progressively worsening patchy bibasilar airspace opacities, concerning for multifocal pneumonia. 2. Mild cardiomegaly and mild vascular congestion. Electronically Signed   By: Garald Balding M.D.   On: 09/19/2016 23:06    Procedures Procedures (including critical care time)  Medications Ordered in ED Medications  levofloxacin (LEVAQUIN) IVPB 750 mg (not administered)     Initial Impression / Assessment and Plan / ED Course  I have reviewed the triage vital signs and the nursing notes.  Pertinent labs & imaging results that were available during my care of the patient were reviewed by me and considered in my medical decision making (see chart for details).  Clinical Course as of Sep 19 2345  Nancy Fetter Sep 19, 2016  2346 Discussed with Dr. Hal Hope who will admit.  Levaquin started.    [HM]    Clinical Course User Index [HM] Iliani Vejar, Jarrett Soho, PA-C    Pt presents with increasing weakness and shortness of breath with  diagnosis of pneumonia several weeks ago with worsening SOB.  Pt is afebrile without elevation in lactic acid. No evidence of sepsis.  CXR with evidence of progressive now bibasilar multifocal pneumonia.  She will need admission for IV abx.    The patient was discussed with and seen by Dr. Gilford Raid who agrees with the treatment plan.   Final Clinical Impressions(s) / ED Diagnoses   Final diagnoses:  Community acquired pneumonia, unspecified laterality  Shortness of breath  Cough    New Prescriptions New Prescriptions   No medications on file  Andreah Goheen, Gwenlyn Perking 09/19/16 2347    Isla Pence, MD 09/19/16 [phone]

## 2016-09-20 ENCOUNTER — Observation Stay (HOSPITAL_BASED_OUTPATIENT_CLINIC_OR_DEPARTMENT_OTHER): Payer: Medicare Other

## 2016-09-20 ENCOUNTER — Observation Stay (HOSPITAL_COMMUNITY): Payer: Medicare Other

## 2016-09-20 ENCOUNTER — Encounter (HOSPITAL_COMMUNITY): Payer: Self-pay | Admitting: Internal Medicine

## 2016-09-20 DIAGNOSIS — R0602 Shortness of breath: Secondary | ICD-10-CM | POA: Diagnosis not present

## 2016-09-20 DIAGNOSIS — Z79899 Other long term (current) drug therapy: Secondary | ICD-10-CM | POA: Diagnosis not present

## 2016-09-20 DIAGNOSIS — I495 Sick sinus syndrome: Secondary | ICD-10-CM | POA: Diagnosis not present

## 2016-09-20 DIAGNOSIS — J40 Bronchitis, not specified as acute or chronic: Secondary | ICD-10-CM | POA: Diagnosis present

## 2016-09-20 DIAGNOSIS — B370 Candidal stomatitis: Secondary | ICD-10-CM | POA: Diagnosis present

## 2016-09-20 DIAGNOSIS — R05 Cough: Secondary | ICD-10-CM | POA: Diagnosis not present

## 2016-09-20 DIAGNOSIS — I77810 Thoracic aortic ectasia: Secondary | ICD-10-CM | POA: Diagnosis present

## 2016-09-20 DIAGNOSIS — J181 Lobar pneumonia, unspecified organism: Secondary | ICD-10-CM | POA: Diagnosis not present

## 2016-09-20 DIAGNOSIS — R0600 Dyspnea, unspecified: Secondary | ICD-10-CM

## 2016-09-20 DIAGNOSIS — Z96653 Presence of artificial knee joint, bilateral: Secondary | ICD-10-CM | POA: Diagnosis present

## 2016-09-20 DIAGNOSIS — J9801 Acute bronchospasm: Secondary | ICD-10-CM | POA: Diagnosis present

## 2016-09-20 DIAGNOSIS — Z7901 Long term (current) use of anticoagulants: Secondary | ICD-10-CM | POA: Diagnosis not present

## 2016-09-20 DIAGNOSIS — J690 Pneumonitis due to inhalation of food and vomit: Secondary | ICD-10-CM | POA: Diagnosis not present

## 2016-09-20 DIAGNOSIS — R1314 Dysphagia, pharyngoesophageal phase: Secondary | ICD-10-CM | POA: Diagnosis present

## 2016-09-20 DIAGNOSIS — J9601 Acute respiratory failure with hypoxia: Secondary | ICD-10-CM | POA: Diagnosis present

## 2016-09-20 DIAGNOSIS — I480 Paroxysmal atrial fibrillation: Secondary | ICD-10-CM | POA: Diagnosis present

## 2016-09-20 DIAGNOSIS — I10 Essential (primary) hypertension: Secondary | ICD-10-CM | POA: Diagnosis not present

## 2016-09-20 DIAGNOSIS — J189 Pneumonia, unspecified organism: Secondary | ICD-10-CM

## 2016-09-20 DIAGNOSIS — I951 Orthostatic hypotension: Secondary | ICD-10-CM | POA: Diagnosis present

## 2016-09-20 DIAGNOSIS — K219 Gastro-esophageal reflux disease without esophagitis: Secondary | ICD-10-CM | POA: Diagnosis present

## 2016-09-20 DIAGNOSIS — Z950 Presence of cardiac pacemaker: Secondary | ICD-10-CM | POA: Diagnosis not present

## 2016-09-20 LAB — BASIC METABOLIC PANEL
Anion gap: 8 (ref 5–15)
BUN: 16 mg/dL (ref 6–20)
CO2: 24 mmol/L (ref 22–32)
Calcium: 9.2 mg/dL (ref 8.9–10.3)
Chloride: 107 mmol/L (ref 101–111)
Creatinine, Ser: 0.81 mg/dL (ref 0.44–1.00)
Glucose, Bld: 113 mg/dL — ABNORMAL HIGH (ref 65–99)
POTASSIUM: 3.4 mmol/L — AB (ref 3.5–5.1)
SODIUM: 139 mmol/L (ref 135–145)

## 2016-09-20 LAB — CBC
HEMATOCRIT: 35.7 % — AB (ref 36.0–46.0)
Hemoglobin: 12.6 g/dL (ref 12.0–15.0)
MCH: 31.5 pg (ref 26.0–34.0)
MCHC: 35.3 g/dL (ref 30.0–36.0)
MCV: 89.3 fL (ref 78.0–100.0)
PLATELETS: 180 10*3/uL (ref 150–400)
RBC: 4 MIL/uL (ref 3.87–5.11)
RDW: 13.7 % (ref 11.5–15.5)
WBC: 6.6 10*3/uL (ref 4.0–10.5)

## 2016-09-20 LAB — URINALYSIS, ROUTINE W REFLEX MICROSCOPIC
BACTERIA UA: NONE SEEN
BILIRUBIN URINE: NEGATIVE
Glucose, UA: NEGATIVE mg/dL
Ketones, ur: NEGATIVE mg/dL
LEUKOCYTES UA: NEGATIVE
Nitrite: NEGATIVE
PH: 5 (ref 5.0–8.0)
Protein, ur: NEGATIVE mg/dL
Specific Gravity, Urine: 1.013 (ref 1.005–1.030)

## 2016-09-20 LAB — CORTISOL: CORTISOL PLASMA: 32.7 ug/dL

## 2016-09-20 LAB — ECHOCARDIOGRAM COMPLETE
Height: 67 in
WEIGHTICAEL: 2610.25 [oz_av]

## 2016-09-20 LAB — TROPONIN I: Troponin I: <0.03 ng/mL (ref <0.03)

## 2016-09-20 LAB — D-DIMER, QUANTITATIVE (NOT AT ARMC): D DIMER QUANT: 0.83 ug{FEU}/mL — AB (ref 0.00–0.50)

## 2016-09-20 LAB — STREP PNEUMONIAE URINARY ANTIGEN: STREP PNEUMO URINARY ANTIGEN: NEGATIVE

## 2016-09-20 LAB — LACTIC ACID, PLASMA: Lactic Acid, Venous: 1 mmol/L (ref 0.5–1.9)

## 2016-09-20 MED ORDER — IOPAMIDOL (ISOVUE-370) INJECTION 76%
100.0000 mL | Freq: Once | INTRAVENOUS | Status: AC | PRN
  Administered 2016-09-20: 100 mL via INTRAVENOUS

## 2016-09-20 MED ORDER — METOPROLOL SUCCINATE ER 25 MG PO TB24
25.0000 mg | ORAL_TABLET | Freq: Every day | ORAL | Status: DC
  Administered 2016-09-20 – 2016-09-24 (×5): 25 mg via ORAL
  Filled 2016-09-20 (×5): qty 1

## 2016-09-20 MED ORDER — DILTIAZEM HCL ER COATED BEADS 240 MG PO CP24
240.0000 mg | ORAL_CAPSULE | Freq: Every day | ORAL | Status: DC
  Administered 2016-09-20 – 2016-09-24 (×5): 240 mg via ORAL
  Filled 2016-09-20 (×5): qty 1

## 2016-09-20 MED ORDER — AMIODARONE HCL 200 MG PO TABS
200.0000 mg | ORAL_TABLET | Freq: Every day | ORAL | Status: DC
  Administered 2016-09-20 – 2016-09-24 (×4): 200 mg via ORAL
  Filled 2016-09-20 (×5): qty 1

## 2016-09-20 MED ORDER — LEVOFLOXACIN IN D5W 750 MG/150ML IV SOLN
750.0000 mg | INTRAVENOUS | Status: DC
  Administered 2016-09-21: 750 mg via INTRAVENOUS
  Filled 2016-09-20: qty 150

## 2016-09-20 MED ORDER — ACETAMINOPHEN 325 MG PO TABS
650.0000 mg | ORAL_TABLET | Freq: Four times a day (QID) | ORAL | Status: DC | PRN
Start: 2016-09-20 — End: 2016-09-24

## 2016-09-20 MED ORDER — ACETAMINOPHEN 650 MG RE SUPP: 650.0000 mg | Freq: Four times a day (QID) | RECTAL | Status: DC | PRN

## 2016-09-20 MED ORDER — APIXABAN 5 MG PO TABS
5.0000 mg | ORAL_TABLET | Freq: Two times a day (BID) | ORAL | Status: DC
  Administered 2016-09-20 – 2016-09-24 (×9): 5 mg via ORAL
  Filled 2016-09-20 (×9): qty 1

## 2016-09-20 MED ORDER — LORATADINE 10 MG PO TABS: 10.0000 mg | ORAL_TABLET | Freq: Every day | ORAL | Status: DC | PRN

## 2016-09-20 MED ORDER — SODIUM CHLORIDE 0.9 % IV SOLN
INTRAVENOUS | Status: DC
  Administered 2016-09-20 – 2016-09-22 (×3): via INTRAVENOUS

## 2016-09-20 MED ORDER — ONDANSETRON HCL 4 MG/2ML IJ SOLN
4.0000 mg | Freq: Once | INTRAMUSCULAR | Status: AC
  Administered 2016-09-20: 4 mg via INTRAVENOUS
  Filled 2016-09-20: qty 2

## 2016-09-20 MED ORDER — SODIUM CHLORIDE 0.9 % IV BOLUS (SEPSIS)
250.0000 mL | Freq: Once | INTRAVENOUS | Status: AC
  Administered 2016-09-20: 250 mL via INTRAVENOUS

## 2016-09-20 MED ORDER — IOPAMIDOL (ISOVUE-370) INJECTION 76%
INTRAVENOUS | Status: AC
  Filled 2016-09-20: qty 100

## 2016-09-20 NOTE — H&P (Signed)
History and Physical    Molly Taylor JOI:[id number] DOB: Feb 27, 1927 DOA: 09/19/2016  PCP: Alroy Dust, L.Marlou Sa, MD  Patient coming from: Home.  Chief Complaint: Cough and dizziness.  HPI: Molly Taylor is a 83 y.o. female with history of tachybradycardia syndrome status post pacemaker placement, paroxysmal atrial fibrillation, hypertension was brought to the ER the patient had persistent cough over the last few weeks. Patient states she also has been getting dizzy on standing. Denies any chest pain nausea vomiting diarrhea. Patient states she has been getting productive cough last few weeks and 2 weeks who had gone to her PCP and was given empiric antibiotics for possible pneumonia. Despite which patient was still having productive cough for which patient had chest x-ray done which showed pneumonia and was prescribed another course of different antibiotics. Patient had been taking these for last 3 days but due to worsening dizziness and cough patient presented to the ER. Patient states her sputum has been whitish in color. Denies any recent travel but has been visiting her husband in the rehabilitation.   ED Course: Chest x-ray in the ER shows worsening pneumonia. Patient also states she easily gets dizzy on standing. Patient was started on Levaquin. Cultures were ordered.  Review of Systems: As per HPI, rest all negative.   Past Medical History:  Diagnosis Date  . Allergic rhinitis, cause unspecified   . Chronic anticoagulation   . Family history of adverse reaction to anesthesia    all children - PONV  . Generalized osteoarthrosis, unspecified site   . GERD (gastroesophageal reflux disease)    pt. denies  . Hemorrhage of rectum and anus   . History of bronchitis   . HTN (hypertension)   . Hypercholesteremia    pt. denies  . Hypertonicity of bladder   . PAF (paroxysmal atrial fibrillation) (HCC) paf   CHADS2VASC score is 4  . Pancreatitis, gallstone   . PONV (postoperative  nausea and vomiting)   . Presence of permanent cardiac pacemaker   . Renal cell cancer ([place])    pt. denies  . Tachy-brady syndrome (HCC)    s/p PPM  . Urinary incontinence   . Wears glasses    reading  . Wears partial dentures    upper partial    Past Surgical History:  Procedure Laterality Date  . ABDOMINAL HYSTERECTOMY    . COLONOSCOPY    . LAPAROSCOPIC CHOLECYSTECTOMY  2007   lap choli  . left knee replacement - Dr. French Ana  2012   lt total knee  . PACEMAKER GENERATOR CHANGE  04/2013   MDT ADDRL1 pacemaker implanted by Dr Lovena Le  . PACEMAKER INSERTION  2007  . PERMANENT PACEMAKER GENERATOR CHANGE N/A 05/03/2013   Procedure: PERMANENT PACEMAKER GENERATOR CHANGE;  Surgeon: Evans Lance, MD;  Location: [facility] CATH LAB;  Service: Cardiovascular;  Laterality: N/A;  . PROXIMAL INTERPHALANGEAL FUSION (PIP) Right 08/22/2013   Procedure: EXCISION MUCOID CYST DEBRIDEMENT PROXIMAL INTERPHALANGEAL JOINT;  Surgeon: Wynonia Sours, MD;  Location: [place] [facility];  Service: Orthopedics;  Laterality: Right;  . THUMB ARTHROSCOPY  2007   right  . TOTAL ABDOMINAL HYSTERECTOMY W/ BILATERAL SALPINGOOPHORECTOMY    . TOTAL KNEE ARTHROPLASTY Right 10/03/2015   Procedure: RIGHT TOTAL KNEE ARTHROPLASTY;  Surgeon: Earlie Server, MD;  Location: [facility];  Service: Orthopedics;  Laterality: Right;  . TUBAL LIGATION    . VEIN LIGATION       reports that she has never smoked. She has never used smokeless  tobacco. She reports that she does not drink alcohol or use drugs.  Allergies  Allergen Reactions  . Sulfa Antibiotics Swelling  . Codeine Nausea And Vomiting    Family History  Problem Relation Age of Onset  . Hypertension Other     Prior to Admission medications   Medication Sig Start Date End Date Taking? Authorizing Provider  amiodarone (PACERONE) 200 MG tablet Take 1 tablet (200 mg total) by mouth daily. 12/10/15  Yes Turner, Eber Hong, MD  amoxicillin (AMOXIL) 500 MG capsule Take 1,000 mg  by mouth 3 (three) times daily.   Yes [provider]  apixaban (ELIQUIS) 5 MG TABS tablet Take 1 tablet (5 mg total) by mouth 2 (two) times daily. These are correct directions. Last rx said 1 po daily. Cannot refill till 11/15/2015 10/20/15  Yes Evans Lance, MD  CARTIA XT 240 MG 24 hr capsule Take 1 capsule (240 mg total) by mouth daily. 12/10/15  Yes Turner, Eber Hong, MD  doxycycline (VIBRAMYCIN) 100 MG capsule Take 100 mg by mouth 2 (two) times daily.   Yes [provider]  loratadine (CLARITIN) 10 MG tablet Take 10 mg by mouth daily as needed for allergies.   Yes [provider]  metoprolol succinate (TOPROL-XL) 25 MG 24 hr tablet Take 1 tablet (25 mg total) by mouth daily. 12/10/15  Yes Sueanne Margarita, MD    Physical Exam: Vitals:   09/19/16 2241 09/19/16 2347 09/19/16 2351 09/20/16 0033  BP: (!) 141/81 (!) 153/72  (!) 142/75  Pulse: (!) 59 63 61 65  Resp: 19 20 16 17   Temp:      TempSrc:      SpO2: 92% 92% 92% 93%  Weight:      Height:          Constitutional: Moderately built and nourished. Vitals:   09/19/16 2241 09/19/16 2347 09/19/16 2351 09/20/16 0033  BP: (!) 141/81 (!) 153/72  (!) 142/75  Pulse: (!) 59 63 61 65  Resp: 19 20 16 17   Temp:      TempSrc:      SpO2: 92% 92% 92% 93%  Weight:      Height:       Eyes: Anicteric no pallor. ENMT: No discharge from the ears eyes nose and mouth. Neck: No JVD appreciated no mass felt. Respiratory: No rhonchi or crepitations. Cardiovascular: S1-S2 heard no murmurs appreciated. Abdomen: Soft nontender bowel sounds present. Musculoskeletal: No edema. No joint effusion. Skin: No rash. Skin appears warm. Neurologic: Alert awake oriented to time place and person. Moves all extremities. Psychiatric: Appears normal. Normal affect.   Labs on Admission: I have personally reviewed following labs and imaging studies  CBC:  Recent Labs Lab 09/19/16 2217  WBC 6.7  NEUTROABS 5.0  HGB 13.6  HCT  38.1  MCV 89.6  PLT 578   Basic Metabolic Panel:  Recent Labs Lab 09/19/16 2217  NA 139  K 3.6  CL 109  CO2 22  GLUCOSE 113*  BUN 16  CREATININE 0.89  CALCIUM 9.4   GFR: Estimated Creatinine Clearance: 43.3 mL/min (by C-G formula based on SCr of 0.89 mg/dL). Liver Function Tests:  Recent Labs Lab 09/19/16 2217  AST 16  ALT 13*  ALKPHOS 66  BILITOT 0.9  PROT 7.3  ALBUMIN 3.3*   No results for input(s): LIPASE, AMYLASE in the last 168 hours. No results for input(s): AMMONIA in the last 168 hours. Coagulation Profile: No results for input(s): INR, PROTIME in  the last 168 hours. Cardiac Enzymes: No results for input(s): CKTOTAL, CKMB, CKMBINDEX, TROPONINI in the last 168 hours. BNP (last 3 results) No results for input(s): PROBNP in the last 8760 hours. HbA1C: No results for input(s): HGBA1C in the last 72 hours. CBG: No results for input(s): GLUCAP in the last 168 hours. Lipid Profile: No results for input(s): CHOL, HDL, LDLCALC, TRIG, CHOLHDL, LDLDIRECT in the last 72 hours. Thyroid Function Tests: No results for input(s): TSH, T4TOTAL, FREET4, T3FREE, THYROIDAB in the last 72 hours. Anemia Panel: No results for input(s): VITAMINB12, FOLATE, FERRITIN, TIBC, IRON, RETICCTPCT in the last 72 hours. Urine analysis:    Component Value Date/Time   COLORURINE YELLOW 09/19/2016 2304   APPEARANCEUR CLEAR 09/19/2016 2304   LABSPEC 1.013 09/19/2016 2304   PHURINE 5.0 09/19/2016 2304   GLUCOSEU NEGATIVE 09/19/2016 2304   HGBUR MODERATE (A) 09/19/2016 2304   BILIRUBINUR NEGATIVE 09/19/2016 2304   KETONESUR NEGATIVE 09/19/2016 2304   PROTEINUR NEGATIVE 09/19/2016 2304   UROBILINOGEN 0.2 03/16/2011 1529   NITRITE NEGATIVE 09/19/2016 2304   LEUKOCYTESUR NEGATIVE 09/19/2016 2304   Sepsis Labs: @LABRCNTIP (procalcitonin:4,lacticidven:4) )No results found for this or any previous visit (from the past 240 hour(s)).   Radiological Exams on Admission: Dg Chest 2  View  Result Date: 09/19/2016 CLINICAL DATA:  Acute onset of congested cough and shortness of breath. Initial encounter. EXAM: CHEST  2 VIEW COMPARISON:  Chest radiograph performed 09/16/2016 FINDINGS: The lungs are well-aerated. Patchy bilateral airspace opacities are progressively worsening and concerning for multifocal pneumonia. There is no evidence of pleural effusion or pneumothorax. The heart is mildly enlarged. Mild vascular congestion is noted. A pacemaker is noted at the left chest wall, with leads ending at the right atrium and right ventricle. No acute osseous abnormalities are seen. IMPRESSION: 1. Progressively worsening patchy bibasilar airspace opacities, concerning for multifocal pneumonia. 2. Mild cardiomegaly and mild vascular congestion. Electronically Signed   By: Garald Balding M.D.   On: 09/19/2016 23:06    EKG: Independently reviewed. Paced rhythm.  Assessment/Plan Principal Problem:   CAP (community acquired pneumonia) Active Problems:   HTN (hypertension)   Tachy-brady syndrome ([place])   Pacemaker    1. Community-acquired pneumonia - patient at this time has been placed on Levaquin. Follow sputum cultures urine for Legionella and strep antigen. Since patient has been having persistent cough for last many weeks I have ordered d-dimer because patient is also short of breath and if positive will get CT angiogram of the chest to check for PE or any amiodarone-induced toxicity or any other cause for pneumonitis. 2. Orthostatic hypotension with dizziness - patient is clearly orthostatic with blood pressure dropping from 601 systolic to 85 systolic on standing. I have ordered 250 mL normal saline bolus. Check lactic acid, cortisol levels 2-D echo. May have to hold patient's rate limiting medications if patient continues to remain orthostatic after bolus. 3. Paroxysmal atrial fibrillation presently rate controlled on Cardizem and metoprolol and amiodarone. See #1 and  #2. 4. Tachybradycardia syndrome status post pacemaker placement. 5. Hypertension see #2.   DVT prophylaxis: Apixaban. Code Status: Full code.  Family Communication: Discussed with patient.  Disposition Plan: Home.  Consults called: None.  Admission status: Observation.    Rise Patience MD [facility] Pager [phone].  If 7PM-7AM, please contact night-coverage [email or web address] Password [facility]  09/20/2016, 12:43 AM

## 2016-09-20 NOTE — Progress Notes (Signed)
Pharmacy Antibiotic Note  Molly Taylor is a 83 y.o. female c/o worsening SOB admitted on 09/19/2016 with pneumonia.  Pharmacy has been consulted for levaquin dosing.  Plan: Levaquin 750 mg IV q48h F/u scr/cultures   Height: 5\' 7"  (170.2 cm) Weight: 157 lb (71.2 kg) IBW/kg (Calculated) : 61.6  Temp (24hrs), Avg:98.4 F (36.9 C), Min:98.4 F (36.9 C), Max:98.4 F (36.9 C)   Recent Labs Lab 09/19/16 2217 09/19/16 2301  WBC 6.7  --   CREATININE 0.89  --   LATICACIDVEN  --  1.08    Estimated Creatinine Clearance: 43.3 mL/min (by C-G formula based on SCr of 0.89 mg/dL).    Allergies  Allergen Reactions  . Sulfa Antibiotics Swelling  . Codeine Nausea And Vomiting    Antimicrobials this admission: 7/1 levaquin >>    >>   Dose adjustments this admission:   Microbiology results:  BCx:   UCx:    Sputum:   MRSA PCR:   Thank you for allowing pharmacy to be a part of this patient's care.  Dorrene German 09/20/2016 12:47 AM

## 2016-09-20 NOTE — Progress Notes (Signed)
  Echocardiogram 2D Echocardiogram has been performed.  Kashtyn Jankowski T Roopa Graver 09/20/2016, 2:37 PM

## 2016-09-20 NOTE — Progress Notes (Signed)
Patient seen and examined at bedside, patient admitted after midnight, please see earlier detailed admission note by Rise Patience, MD. Briefly, patient presented with dyspnea with concern for pneumonia. D-dimer mildly elevated and CT PE ordered. Antibiotics started.   Cordelia Poche, MD [facility] 09/20/2016, 10:47 AM Pager: [phone]

## 2016-09-21 DIAGNOSIS — I495 Sick sinus syndrome: Secondary | ICD-10-CM

## 2016-09-21 DIAGNOSIS — I10 Essential (primary) hypertension: Secondary | ICD-10-CM

## 2016-09-21 DIAGNOSIS — J181 Lobar pneumonia, unspecified organism: Secondary | ICD-10-CM

## 2016-09-21 DIAGNOSIS — Z950 Presence of cardiac pacemaker: Secondary | ICD-10-CM

## 2016-09-21 DIAGNOSIS — R0602 Shortness of breath: Secondary | ICD-10-CM

## 2016-09-21 LAB — MRSA PCR SCREENING: MRSA BY PCR: NEGATIVE

## 2016-09-21 MED ORDER — POLYETHYLENE GLYCOL 3350 17 G PO PACK: 17.0000 g | PACK | Freq: Every day | ORAL | Status: DC | PRN

## 2016-09-21 NOTE — Progress Notes (Addendum)
PROGRESS NOTE    Molly Taylor  NTI:[id number] DOB: 03-22-1927 DOA: 09/19/2016 PCP: Alroy Dust, L.Marlou Sa, MD   Brief Narrative: Molly Taylor is a 83 y.o. female with a history of tachycardia bradycardia syndrome status post pacemaker, paroxysmal atrial fibrillation, and hypertension. She presents with pneumonia with failed outpatient treatment. She started on Levaquin IV. Blood culture and sputum cultures pending. Developed oxygen requirement.   Assessment & Plan:   Principal Problem:   CAP (community acquired pneumonia) Active Problems:   HTN (hypertension)   Tachy-brady syndrome (HCC)   Pacemaker   Lobar pneumonia Seen on x-ray and CT scan. Failed outpatient treatment of amoxicillin, doxycycline. No evidence of aspiration symptoms per patient. -continue Levaquin -flutter valve -incentive spirometer -blood/sputum culture  Acute respiratory failure with hypoxia Secondary to pneumonia. -wean to room air.  Ascending aorta dilation Stable from CT scan from last year. Daughter has had two surgeries for aortic aneurysms with rupture. -patient will follow-up with cardiologist. Will need yearly monitoring.  Tachy-brady syndrome Paroxysmal a-fib Patient with a pacemaker -continue metoprolol/diltiazem/amiodarone -continue Eliquis  Essential hypertension -continue metoprolol -continue diltiazem   DVT prophylaxis: Eliquis Code Status: Full code Family Communication: Two daughters at bedside Disposition Plan: Discharge in 24-48 hours, likely home   Consultants:   None  Procedures:   None  Antimicrobials:  Levaquin   Subjective: Cough with sputum. No dyspnea or chest pain.  Objective: Vitals:   09/20/16 1500 09/20/16 2157 09/21/16 0411 09/21/16 1336  BP: 126/70 (!) 120/57 (!) 112/58 133/70  Pulse: (!) 101 61 63 64  Resp: 18 18 16 18   Temp: 98 F (36.7 C) 99.2 F (37.3 C) 99.1 F (37.3 C) 98.3 F (36.8 C)  TempSrc: Oral Oral Oral Oral  SpO2: 92%  94% 92% 93%  Weight:      Height:        Intake/Output Summary (Last 24 hours) at 09/21/16 1413 Last data filed at 09/21/16 0600  Gross per 24 hour  Intake             1350 ml  Output                0 ml  Net             1350 ml   Filed Weights   09/19/16 2120 09/20/16 0106  Weight: 71.2 kg (157 lb) 74 kg (163 lb 2.3 oz)    Examination:  General exam: Appears calm and comfortable Respiratory system: Decreased breath sounds with crackles at bases. Respiratory effort normal. Cardiovascular system: S1 & S2 heard, RRR. No murmurs. Gastrointestinal system: Abdomen is nondistended, soft and nontender. No organomegaly or masses felt. Normal bowel sounds heard. Central nervous system: Alert and oriented. No focal neurological deficits. Extremities: No edema. No calf tenderness Skin: No cyanosis. No rashes Psychiatry: Judgement and insight appear normal. Mood & affect appropriate.     Data Reviewed: I have personally reviewed following labs and imaging studies  CBC:  Recent Labs Lab 09/19/16 2217 09/20/16 0125  WBC 6.7 6.6  NEUTROABS 5.0  --   HGB 13.6 12.6  HCT 38.1 35.7*  MCV 89.6 89.3  PLT 190 867   Basic Metabolic Panel:  Recent Labs Lab 09/19/16 2217 09/20/16 0125  NA 139 139  K 3.6 3.4*  CL 109 107  CO2 22 24  GLUCOSE 113* 113*  BUN 16 16  CREATININE 0.89 0.81  CALCIUM 9.4 9.2   GFR: Estimated Creatinine Clearance: 51.4 mL/min (by C-G formula based on  SCr of 0.81 mg/dL). Liver Function Tests:  Recent Labs Lab 09/19/16 2217  AST 16  ALT 13*  ALKPHOS 66  BILITOT 0.9  PROT 7.3  ALBUMIN 3.3*   No results for input(s): LIPASE, AMYLASE in the last 168 hours. No results for input(s): AMMONIA in the last 168 hours. Coagulation Profile: No results for input(s): INR, PROTIME in the last 168 hours. Cardiac Enzymes:  Recent Labs Lab 09/20/16 0522  TROPONINI <0.03   BNP (last 3 results) No results for input(s): PROBNP in the last 8760  hours. HbA1C: No results for input(s): HGBA1C in the last 72 hours. CBG: No results for input(s): GLUCAP in the last 168 hours. Lipid Profile: No results for input(s): CHOL, HDL, LDLCALC, TRIG, CHOLHDL, LDLDIRECT in the last 72 hours. Thyroid Function Tests: No results for input(s): TSH, T4TOTAL, FREET4, T3FREE, THYROIDAB in the last 72 hours. Anemia Panel: No results for input(s): VITAMINB12, FOLATE, FERRITIN, TIBC, IRON, RETICCTPCT in the last 72 hours. Sepsis Labs:  Recent Labs Lab 09/19/16 2301 09/20/16 0534  LATICACIDVEN 1.08 1.0    Recent Results (from the past 240 hour(s))  Blood Culture (routine x 2)     Status: None (Preliminary result)   Collection Time: 09/19/16 10:50 PM  Result Value Ref Range Status   Specimen Description BLOOD RIGHT ANTECUBITAL  Final   Special Requests   Final    BOTTLES DRAWN AEROBIC AND ANAEROBIC Blood Culture adequate volume Performed at [facility], [place] [address], [place], [place] [address]    Culture PENDING  Incomplete   Report Status PENDING  Incomplete  Blood Culture (routine x 2)     Status: None (Preliminary result)   Collection Time: 09/20/16 12:18 AM  Result Value Ref Range Status   Specimen Description BLOOD LEFT ANTECUBITAL  Final   Special Requests   Final    BOTTLES DRAWN AEROBIC AND ANAEROBIC Blood Culture adequate volume Performed at [facility], [place] [address], [place], [place] [address]    Culture PENDING  Incomplete   Report Status PENDING  Incomplete  MRSA PCR Screening     Status: None   Collection Time: 09/20/16  3:20 PM  Result Value Ref Range Status   MRSA by PCR NEGATIVE NEGATIVE Final    Comment:        The GeneXpert MRSA Assay (FDA approved for NASAL specimens only), is one component of a comprehensive MRSA colonization surveillance program. It is not intended to diagnose MRSA infection nor to guide or monitor treatment for MRSA infections.          Radiology Studies: Dg Chest 2  View  Result Date: 09/19/2016 CLINICAL DATA:  Acute onset of congested cough and shortness of breath. Initial encounter. EXAM: CHEST  2 VIEW COMPARISON:  Chest radiograph performed 09/16/2016 FINDINGS: The lungs are well-aerated. Patchy bilateral airspace opacities are progressively worsening and concerning for multifocal pneumonia. There is no evidence of pleural effusion or pneumothorax. The heart is mildly enlarged. Mild vascular congestion is noted. A pacemaker is noted at the left chest wall, with leads ending at the right atrium and right ventricle. No acute osseous abnormalities are seen. IMPRESSION: 1. Progressively worsening patchy bibasilar airspace opacities, concerning for multifocal pneumonia. 2. Mild cardiomegaly and mild vascular congestion. Electronically Signed   By: Garald Balding M.D.   On: 09/19/2016 23:06   Ct Angio Chest Pe W Or Wo Contrast  Result Date: 09/20/2016 CLINICAL DATA:  Dyspnea.  History of renal cell carcinoma. EXAM:  CT ANGIOGRAPHY CHEST WITH CONTRAST TECHNIQUE: Multidetector CT imaging of the chest was performed using the standard protocol during bolus administration of intravenous contrast. Multiplanar CT image reconstructions and MIPs were obtained to evaluate the vascular anatomy. CONTRAST:  100 cc Isovue 370 intravenously COMPARISON:  Chest radiograph 09/19/2016 FINDINGS: Cardiovascular: Satisfactory opacification of the pulmonary arteries to the segmental level. No evidence of pulmonary embolism. Enlarged heart. No pericardial effusion. Calcific atherosclerotic disease of the aorta and coronary arteries. Mild dilation of the ascending aorta measuring 4.2 cm in greatest dimension. Mediastinum/Nodes: No enlarged mediastinal, hilar, or axillary lymph nodes. Thyroid gland, trachea, and esophagus demonstrate no significant findings. Lungs/Pleura: Multifocal patchy airspace consolidation in lingula and bilateral lower lobes. Additional areas of ground-glass consolidation in  bilateral upper lobes. Upper Abdomen: No acute abnormality. Musculoskeletal: No chest wall abnormality. No acute or significant osseous findings. Cardiac pacemaker within the left chest wall with intact leads. Review of the MIP images confirms the above findings. IMPRESSION: Bilateral lower lobe patchy airspace consolidation which may represent multilobar pneumonia. Aspiration pneumonia is also on the differential diagnosis. Additional nonspecific areas of ground-glass opacity in bilateral upper lobes. In the acute clinical settings, these may represent infectious or inflammatory consolidation. However follow-up is recommended after resolution of the acute symptoms. No evidence of pulmonary embolus. Mild dilation of the ascending aorta measuring 4.2 cm. Recommend annual imaging followup by CTA or MRA. This recommendation follows 2010 ACCF/AHA/AATS/ACR/ASA/SCA/SCAI/SIR/STS/SVM Guidelines for the Diagnosis and Management of Patients with Thoracic Aortic Disease. Circulation. 2010; 121: J194-R740 Electronically Signed   By: Fidela Salisbury M.D.   On: 09/20/2016 12:15        Scheduled Meds: . amiodarone  200 mg Oral Daily  . apixaban  5 mg Oral BID  . diltiazem  240 mg Oral Daily  . metoprolol succinate  25 mg Oral Daily   Continuous Infusions: . sodium chloride 75 mL/hr at 09/20/16 1200  . levofloxacin (LEVAQUIN) IV       LOS: 1 day     Cordelia Poche, MD [facility] 09/21/2016, 2:13 PM Pager: [phone]  If 7PM-7AM, please contact night-coverage [email or web address] Password [email or web address] 09/21/2016, 2:13 PM

## 2016-09-21 NOTE — Evaluation (Signed)
Physical Therapy Evaluation Patient Details Name: Molly Taylor MRN: [id number] DOB: 25-Jun-1926 Today's Date: 09/21/2016   History of Present Illness  83 yo female admitted with Pna. Hx of tachybrady syndrome, pacemaker, afib, htn, R TKA 2017  Clinical Impression  On eval, pt required Min assist for mobility. She walked ~125 feet with intermittent use of hallway handrail to steady. O2 sat dropped to 85% on RA, dyspnea 2/4 with ambulation. Pt participated well. Will follow and progress activity as tolerated. Recommend HHPT f/u.    Follow Up Recommendations Home health PT;Supervision - Intermittent    Equipment Recommendations   (continuing to assess-possibly cane?)    Recommendations for Other Services       Precautions / Restrictions Precautions Precautions: Fall Restrictions Weight Bearing Restrictions: No      Mobility  Bed Mobility Overal bed mobility: Needs Assistance Bed Mobility: Supine to Sit;Sit to Supine     Supine to sit: Supervision Sit to supine: Supervision   General bed mobility comments: for safety, lines  Transfers Overall transfer level: Needs assistance   Transfers: Sit to/from Stand Sit to Stand: Min guard         General transfer comment: close guard for safety. Increased time.   Ambulation/Gait Ambulation/Gait assistance: Min assist Ambulation Distance (Feet): 125 Feet Assistive device:  (int use of hallway handrail) Gait Pattern/deviations: Step-through pattern;Decreased stride length;Drifts right/left     General Gait Details: Intermittent assist to stabilize. O2 sats dropped to 85% on RA, dyspnea 2/4.   Stairs            Wheelchair Mobility    Modified Rankin (Stroke Patients Only)       Balance Overall balance assessment: Needs assistance           Standing balance-Leahy Scale: Fair                               Pertinent Vitals/Pain Pain Assessment: No/denies pain    Home Living  Family/patient expects to be discharged to:: Private residence Living Arrangements: Alone   Type of Home: House Home Access: Stairs to enter   Technical brewer of Steps: 2 Home Layout: One level Home Equipment: None      Prior Function Level of Independence: Independent               Hand Dominance        Extremity/Trunk Assessment   Upper Extremity Assessment Upper Extremity Assessment: Generalized weakness    Lower Extremity Assessment Lower Extremity Assessment: Generalized weakness    Cervical / Trunk Assessment Cervical / Trunk Assessment: Normal  Communication   Communication: No difficulties  Cognition Arousal/Alertness: Awake/alert Behavior During Therapy: WFL for tasks assessed/performed Overall Cognitive Status: Within Functional Limits for tasks assessed                                        General Comments      Exercises     Assessment/Plan    PT Assessment Patient needs continued PT services  PT Problem List Decreased strength;Decreased mobility;Decreased activity tolerance;Decreased balance;Decreased knowledge of use of DME       PT Treatment Interventions Gait training;Therapeutic activities;Therapeutic exercise;Patient/family education;DME instruction;Functional mobility training    PT Goals (Current goals can be found in the Care Plan section)  Acute Rehab PT Goals Patient Stated Goal: to regain  PLOF. home.  PT Goal Formulation: With patient Time For Goal Achievement: 10/05/16 Potential to Achieve Goals: Good    Frequency Min 3X/week   Barriers to discharge        Co-evaluation               AM-PAC PT "6 Clicks" Daily Activity  Outcome Measure Difficulty turning over in bed (including adjusting bedclothes, sheets and blankets)?: A Little Difficulty moving from lying on back to sitting on the side of the bed? : A Little Difficulty sitting down on and standing up from a chair with arms (e.g.,  wheelchair, bedside commode, etc,.)?: A Little Help needed moving to and from a bed to chair (including a wheelchair)?: A Little Help needed walking in hospital room?: A Little Help needed climbing 3-5 steps with a railing? : A Little 6 Click Score: 18    End of Session Equipment Utilized During Treatment: Gait belt Activity Tolerance: Patient tolerated treatment well Patient left: in bed;with call bell/phone within reach;with family/visitor present   PT Visit Diagnosis: Muscle weakness (generalized) (M62.81);Difficulty in walking, not elsewhere classified (R26.2)    Time: 9163-8466 PT Time Calculation (min) (ACUTE ONLY): 15 min   Charges:   PT Evaluation $PT Eval Low Complexity: 1 Procedure     PT G Codes:          [place]on [place], MPT Pager: [phone]

## 2016-09-22 DIAGNOSIS — R05 Cough: Secondary | ICD-10-CM

## 2016-09-22 LAB — BASIC METABOLIC PANEL
ANION GAP: 8 (ref 5–15)
BUN: 11 mg/dL (ref 6–20)
CALCIUM: 9 mg/dL (ref 8.9–10.3)
CO2: 21 mmol/L — AB (ref 22–32)
Chloride: 109 mmol/L (ref 101–111)
Creatinine, Ser: 0.69 mg/dL (ref 0.44–1.00)
GFR calc Af Amer: >60 mL/min (ref >60)
GFR calc non Af Amer: >60 mL/min (ref >60)
GLUCOSE: 137 mg/dL — AB (ref 65–99)
POTASSIUM: 3.9 mmol/L (ref 3.5–5.1)
Sodium: 138 mmol/L (ref 135–145)

## 2016-09-22 LAB — GLUCOSE, CAPILLARY
GLUCOSE-CAPILLARY: 89 mg/dL (ref 65–99)
Glucose-Capillary: 88 mg/dL (ref 65–99)

## 2016-09-22 LAB — LEGIONELLA PNEUMOPHILA SEROGP 1 UR AG: L. pneumophila Serogp 1 Ur Ag: NEGATIVE

## 2016-09-22 MED ORDER — IPRATROPIUM-ALBUTEROL 0.5-2.5 (3) MG/3ML IN SOLN
3.0000 mL | Freq: Four times a day (QID) | RESPIRATORY_TRACT | Status: DC
  Administered 2016-09-22 (×2): 3 mL via RESPIRATORY_TRACT
  Filled 2016-09-22 (×2): qty 3

## 2016-09-22 MED ORDER — PANTOPRAZOLE SODIUM 40 MG PO TBEC
40.0000 mg | DELAYED_RELEASE_TABLET | Freq: Two times a day (BID) | ORAL | Status: DC
  Administered 2016-09-22 – 2016-09-24 (×5): 40 mg via ORAL
  Filled 2016-09-22 (×5): qty 1

## 2016-09-22 MED ORDER — BUDESONIDE 0.25 MG/2ML IN SUSP
0.2500 mg | Freq: Two times a day (BID) | RESPIRATORY_TRACT | Status: DC
  Administered 2016-09-22 – 2016-09-24 (×5): 0.25 mg via RESPIRATORY_TRACT
  Filled 2016-09-22 (×5): qty 2

## 2016-09-22 MED ORDER — POLYETHYLENE GLYCOL 3350 17 G PO PACK
17.0000 g | PACK | Freq: Every day | ORAL | Status: DC
  Administered 2016-09-23 – 2016-09-24 (×2): 17 g via ORAL
  Filled 2016-09-22 (×2): qty 1

## 2016-09-22 MED ORDER — NYSTATIN 100000 UNIT/ML MT SUSP
5.0000 mL | Freq: Four times a day (QID) | OROMUCOSAL | Status: DC
  Administered 2016-09-22 – 2016-09-24 (×7): 500000 [IU] via ORAL
  Filled 2016-09-22 (×7): qty 5

## 2016-09-22 MED ORDER — SODIUM CHLORIDE 0.9 % IV SOLN
3.0000 g | Freq: Four times a day (QID) | INTRAVENOUS | Status: DC
  Administered 2016-09-22 – 2016-09-24 (×8): 3 g via INTRAVENOUS
  Filled 2016-09-22 (×9): qty 3

## 2016-09-22 MED ORDER — IPRATROPIUM-ALBUTEROL 0.5-2.5 (3) MG/3ML IN SOLN
3.0000 mL | Freq: Three times a day (TID) | RESPIRATORY_TRACT | Status: DC
  Administered 2016-09-23 – 2016-09-24 (×4): 3 mL via RESPIRATORY_TRACT
  Filled 2016-09-22 (×4): qty 3

## 2016-09-22 NOTE — Progress Notes (Signed)
Physical Therapy Treatment Patient Details Name: Molly Taylor MRN: [id number] DOB: 12/11/1926 Today's Date: 09/22/2016    History of Present Illness 83 yo female admitted with Pna. Hx of tachybrady syndrome, pacemaker, afib, htn, R TKA 2017    PT Comments    Pt assisted with ambulating in hallway.  Pt also required supplemental oxygen.  SATURATION QUALIFICATIONS: (This note is used to comply with regulatory documentation for home oxygen)  Patient Saturations on Room Air at Rest = 92%  Patient Saturations on Room Air while Ambulating = 82%  Patient Saturations on 2 Liters of oxygen while Ambulating = 90%  Please briefly explain why patient needs home oxygen: to improve oxygen saturations above 88% during physical activity such as ambulation   Follow Up Recommendations  Home health PT;Supervision - Intermittent     Equipment Recommendations  Molly Taylor (qualifies for supplemental oxygen)    Recommendations for Other Services       Precautions / Restrictions Precautions Precautions: Fall Precaution Comments: monitor sats    Mobility  Bed Mobility Overal bed mobility: Needs Assistance Bed Mobility: Supine to Sit     Supine to sit: Supervision     General bed mobility comments: for safety, lines  Transfers Overall transfer level: Needs assistance Equipment used: None Transfers: Sit to/from Stand;Stand Pivot Transfers Sit to Stand: Min guard Stand pivot transfers: Min guard       General transfer comment: increased time, uses UEs to self assist  Ambulation/Gait Ambulation/Gait assistance: Min guard Ambulation Distance (Feet): 40 Feet Assistive device: None       General Gait Details: pt occasionally used hand rail to self steady, 40 x2, pt required supplemental oxygen   Stairs            Wheelchair Mobility    Modified Rankin (Stroke Patients Only)       Balance                                            Cognition  Arousal/Alertness: Awake/alert Behavior During Therapy: WFL for tasks assessed/performed Overall Cognitive Status: Within Functional Limits for tasks assessed                                        Exercises      General Comments        Pertinent Vitals/Pain Pain Assessment: No/denies pain    Home Living                      Prior Function            PT Goals (current goals can now be found in the care plan section) Progress towards PT goals: Progressing toward goals    Frequency    Min 3X/week      PT Plan Current plan remains appropriate    Co-evaluation              AM-PAC PT "6 Clicks" Daily Activity  Outcome Measure  Difficulty turning over in bed (including adjusting bedclothes, sheets and blankets)?: A Little Difficulty moving from lying on back to sitting on the side of the bed? : A Little Difficulty sitting down on and standing up from a chair with arms (Taylor.g., wheelchair, bedside commode, etc,.)?: A Little Help needed moving  to and from a bed to chair (including a wheelchair)?: A Little Help needed walking in hospital room?: A Little Help needed climbing 3-5 steps with a railing? : A Little 6 Click Score: 18    End of Session Equipment Utilized During Treatment: Gait belt;Oxygen Activity Tolerance: Patient limited by fatigue Patient left: with call bell/phone within reach;with family/visitor present;in chair Nurse Communication: Mobility status PT Visit Diagnosis: Muscle weakness (generalized) (M62.81);Difficulty in walking, not elsewhere classified (R26.2)     Time: 4315-4008 PT Time Calculation (min) (ACUTE ONLY): 18 min  Charges:  $Gait Training: 8-22 mins                    G Codes:       Molly Taylor, PT, DPT 09/22/2016 Pager: [phone]  Molly Taylor 09/22/2016, 12:18 PM

## 2016-09-22 NOTE — Progress Notes (Signed)
PROGRESS NOTE    Molly Taylor  NFA:[id number] DOB: 03-30-1926 DOA: 09/19/2016 PCP: Alroy Dust, L.Marlou Sa, MD   Brief Narrative: Molly Taylor is a 83 y.o. female with a history of tachycardia bradycardia syndrome status post pacemaker, paroxysmal atrial fibrillation, and hypertension. She presents with pneumonia with failed outpatient treatment. She started on Levaquin IV. Blood culture and sputum cultures pending. Developed oxygen requirement.   Assessment & Plan:   Principal Problem:   CAP (community acquired pneumonia) Active Problems:   HTN (hypertension)   Tachy-brady syndrome (HCC)   Pacemaker   Multi Lobar pneumonia Seen on x-ray and CT scan. Failed outpatient treatment of amoxicillin, doxycycline. No evidence of aspiration symptoms per patient. -change Levaquin to Unasyn due to patient age  -flutter valve -incentive spirometer -blood/sputum culture -CT with bilateral air space diseases, ground glass opacity.  -start duo neb and  Pulmicort.  -speech evaluation.  -star protonix to prevents reflux.   Acute respiratory failure with hypoxia Secondary to pneumonia. If no improvement will need to consider effect from amiodarone.  -wean to room air. -might need home oxygen,   Ascending aorta dilation Stable from CT scan from last year. Daughter has had two surgeries for aortic aneurysms with rupture. -patient will follow-up with cardiologist. Will need yearly monitoring.  Tachy-brady syndrome Paroxysmal a-fib Patient with a pacemaker -continue metoprolol/diltiazem/amiodarone -continue Eliquis  Essential hypertension -continue metoprolol -continue diltiazem  Oral thrush;  Nystatin   DVT prophylaxis: Eliquis Code Status: Full code Family Communication: Two daughters at bedside Disposition Plan: Discharge in 24-48 hours, likely home   Consultants:   None  Procedures:   None  Antimicrobials:  Levaquin   Subjective: Report cough, worse at night.  Report reflux symptoms.  Burning sensation mouth today   Objective: Vitals:   09/21/16 1336 09/21/16 2055 09/22/16 0432 09/22/16 1029  BP: 133/70 117/72 131/63 121/64  Pulse: 64 60 64 63  Resp: 18 18 18    Temp: 98.3 F (36.8 C) 99.8 F (37.7 C) 99.5 F (37.5 C)   TempSrc: Oral Oral Oral   SpO2: 93% 93% 93%   Weight:      Height:        Intake/Output Summary (Last 24 hours) at 09/22/16 1059 Last data filed at 09/22/16 0200  Gross per 24 hour  Intake             1970 ml  Output                0 ml  Net             1970 ml   Filed Weights   09/19/16 2120 09/20/16 0106  Weight: 71.2 kg (157 lb) 74 kg (163 lb 2.3 oz)    Examination:  General exam: NAD Respiratory system: fine crackles, no wheezing  Cardiovascular system: S 1, S 2 RRR Gastrointestinal system: BS present, soft, nt Central nervous system: Non focal.  Extremities: no edema  Skin: No cyanosis. No rashes Psychiatry: Mood and affect appropriate.     Data Reviewed: I have personally reviewed following labs and imaging studies  CBC:  Recent Labs Lab 09/19/16 2217 09/20/16 0125  WBC 6.7 6.6  NEUTROABS 5.0  --   HGB 13.6 12.6  HCT 38.1 35.7*  MCV 89.6 89.3  PLT 190 469   Basic Metabolic Panel:  Recent Labs Lab 09/19/16 2217 09/20/16 0125 09/22/16 0858  NA 139 139 138  K 3.6 3.4* 3.9  CL 109 107 109  CO2 22 24 21*  GLUCOSE 113* 113* 137*  BUN 16 16 11   CREATININE 0.89 0.81 0.69  CALCIUM 9.4 9.2 9.0   GFR: Estimated Creatinine Clearance: 52.1 mL/min (by C-G formula based on SCr of 0.69 mg/dL). Liver Function Tests:  Recent Labs Lab 09/19/16 2217  AST 16  ALT 13*  ALKPHOS 66  BILITOT 0.9  PROT 7.3  ALBUMIN 3.3*   No results for input(s): LIPASE, AMYLASE in the last 168 hours. No results for input(s): AMMONIA in the last 168 hours. Coagulation Profile: No results for input(s): INR, PROTIME in the last 168 hours. Cardiac Enzymes:  Recent Labs Lab 09/20/16 0522  TROPONINI  <0.03   BNP (last 3 results) No results for input(s): PROBNP in the last 8760 hours. HbA1C: No results for input(s): HGBA1C in the last 72 hours. CBG:  Recent Labs Lab 09/22/16 0807  GLUCAP 88   Lipid Profile: No results for input(s): CHOL, HDL, LDLCALC, TRIG, CHOLHDL, LDLDIRECT in the last 72 hours. Thyroid Function Tests: No results for input(s): TSH, T4TOTAL, FREET4, T3FREE, THYROIDAB in the last 72 hours. Anemia Panel: No results for input(s): VITAMINB12, FOLATE, FERRITIN, TIBC, IRON, RETICCTPCT in the last 72 hours. Sepsis Labs:  Recent Labs Lab 09/19/16 2301 09/20/16 0534  LATICACIDVEN 1.08 1.0    Recent Results (from the past 240 hour(s))  Blood Culture (routine x 2)     Status: None (Preliminary result)   Collection Time: 09/19/16 10:50 PM  Result Value Ref Range Status   Specimen Description BLOOD RIGHT ANTECUBITAL  Final   Special Requests   Final    BOTTLES DRAWN AEROBIC AND ANAEROBIC Blood Culture adequate volume   Culture   Final    NO GROWTH 2 DAYS Performed at [facility], [address] [place], [place] [address]    Report Status PENDING  Incomplete  Blood Culture (routine x 2)     Status: None (Preliminary result)   Collection Time: 09/20/16 12:18 AM  Result Value Ref Range Status   Specimen Description BLOOD LEFT ANTECUBITAL  Final   Special Requests   Final    BOTTLES DRAWN AEROBIC AND ANAEROBIC Blood Culture adequate volume   Culture   Final    NO GROWTH 2 DAYS Performed at [facility], [place] [address], [place], [place] [address]    Report Status PENDING  Incomplete  MRSA PCR Screening     Status: None   Collection Time: 09/20/16  3:20 PM  Result Value Ref Range Status   MRSA by PCR NEGATIVE NEGATIVE Final    Comment:        The GeneXpert MRSA Assay (FDA approved for NASAL specimens only), is one component of a comprehensive MRSA colonization surveillance program. It is not intended to diagnose MRSA infection nor to guide  or monitor treatment for MRSA infections.          Radiology Studies: Ct Angio Chest Pe W Or Wo Contrast  Result Date: 09/20/2016 CLINICAL DATA:  Dyspnea.  History of renal cell carcinoma. EXAM: CT ANGIOGRAPHY CHEST WITH CONTRAST TECHNIQUE: Multidetector CT imaging of the chest was performed using the standard protocol during bolus administration of intravenous contrast. Multiplanar CT image reconstructions and MIPs were obtained to evaluate the vascular anatomy. CONTRAST:  100 cc Isovue 370 intravenously COMPARISON:  Chest radiograph 09/19/2016 FINDINGS: Cardiovascular: Satisfactory opacification of the pulmonary arteries to the segmental level. No evidence of pulmonary embolism. Enlarged heart. No pericardial effusion. Calcific atherosclerotic disease of the aorta and coronary arteries. Mild dilation  of the ascending aorta measuring 4.2 cm in greatest dimension. Mediastinum/Nodes: No enlarged mediastinal, hilar, or axillary lymph nodes. Thyroid gland, trachea, and esophagus demonstrate no significant findings. Lungs/Pleura: Multifocal patchy airspace consolidation in lingula and bilateral lower lobes. Additional areas of ground-glass consolidation in bilateral upper lobes. Upper Abdomen: No acute abnormality. Musculoskeletal: No chest wall abnormality. No acute or significant osseous findings. Cardiac pacemaker within the left chest wall with intact leads. Review of the MIP images confirms the above findings. IMPRESSION: Bilateral lower lobe patchy airspace consolidation which may represent multilobar pneumonia. Aspiration pneumonia is also on the differential diagnosis. Additional nonspecific areas of ground-glass opacity in bilateral upper lobes. In the acute clinical settings, these may represent infectious or inflammatory consolidation. However follow-up is recommended after resolution of the acute symptoms. No evidence of pulmonary embolus. Mild dilation of the ascending aorta measuring 4.2 cm.  Recommend annual imaging followup by CTA or MRA. This recommendation follows 2010 ACCF/AHA/AATS/ACR/ASA/SCA/SCAI/SIR/STS/SVM Guidelines for the Diagnosis and Management of Patients with Thoracic Aortic Disease. Circulation. 2010; 121: G761-O485 Electronically Signed   By: Fidela Salisbury M.D.   On: 09/20/2016 12:15        Scheduled Meds: . amiodarone  200 mg Oral Daily  . apixaban  5 mg Oral BID  . diltiazem  240 mg Oral Daily  . metoprolol succinate  25 mg Oral Daily   Continuous Infusions: . sodium chloride 75 mL/hr at 09/22/16 0636  . levofloxacin (LEVAQUIN) IV Stopped (09/22/16 0019)     LOS: 2 days     Elmarie Shiley, MD [facility] 09/22/2016, 10:59 AM Pager: [phone]  If 7PM-7AM, please contact night-coverage [email or web address] Password [email or web address] 09/22/2016, 10:59 AM

## 2016-09-22 NOTE — Plan of Care (Signed)
Problem: Activity: Goal: Risk for activity intolerance will decrease Outcome: Progressing Pt states she feels better today than she did yesterday and feels more steady while ambulating.

## 2016-09-22 NOTE — Progress Notes (Signed)
Pharmacy Antibiotic Note  Molly Taylor is a 83 y.o. female presented to the [facility] on 09/19/2016 with c/o cough and dizziness.  CXR with concern for multifocal PNA.  Levaquin was started on admission.  To change abx to unasyn for suspected aspiration PNA.  Plan: - unasyn 3 gm IV q6h  _____________________________  Height: 5\' 7"  (170.2 cm) Weight: 163 lb 2.3 oz (74 kg) IBW/kg (Calculated) : 61.6  Temp (24hrs), Avg:99.2 F (37.3 C), Min:98.3 F (36.8 C), Max:99.8 F (37.7 C)   Recent Labs Lab 09/19/16 2217 09/19/16 2301 09/20/16 0125 09/20/16 0534 09/22/16 0858  WBC 6.7  --  6.6  --   --   CREATININE 0.89  --  0.81  --  0.69  LATICACIDVEN  --  1.08  --  1.0  --     Estimated Creatinine Clearance: 52.1 mL/min (by C-G formula based on SCr of 0.69 mg/dL).    Allergies  Allergen Reactions  . Sulfa Antibiotics Swelling  . Codeine Nausea And Vomiting    Antimicrobials this admission:  7/1 LVQ>>7/4 7/4 unasyn (asp PNA)>>  Dose adjustments this admission: --  Microbiology results: 7/1 BCx x2: ngtd 7/2 Urine strep: neg 7/2 Urine legionella: IP 7/2 MRSA PCR: neg  Thank you for allowing pharmacy to be a part of this patient's care.  Lynelle Doctor 09/22/2016 11:38 AM

## 2016-09-23 ENCOUNTER — Encounter (HOSPITAL_COMMUNITY): Payer: Self-pay

## 2016-09-23 DIAGNOSIS — J690 Pneumonitis due to inhalation of food and vomit: Principal | ICD-10-CM

## 2016-09-23 LAB — CBC
HCT: 33.7 % — ABNORMAL LOW (ref 36.0–46.0)
Hemoglobin: 11.9 g/dL — ABNORMAL LOW (ref 12.0–15.0)
MCH: 31.6 pg (ref 26.0–34.0)
MCHC: 35.3 g/dL (ref 30.0–36.0)
MCV: 89.6 fL (ref 78.0–100.0)
PLATELETS: 181 10*3/uL (ref 150–400)
RBC: 3.76 MIL/uL — AB (ref 3.87–5.11)
RDW: 14.1 % (ref 11.5–15.5)
WBC: 7.3 10*3/uL (ref 4.0–10.5)

## 2016-09-23 NOTE — Evaluation (Signed)
Clinical/Bedside Swallow Evaluation Patient Details  Name: Molly Taylor MRN: [id number] Date of Birth: 04/23/1926  Today's Date: 09/23/2016 Time: SLP Start Time (ACUTE ONLY): 1030 SLP Stop Time (ACUTE ONLY): 1052 SLP Time Calculation (min) (ACUTE ONLY): 22 min  Past Medical History:  Past Medical History:  Diagnosis Date  . Allergic rhinitis, cause unspecified   . Chronic anticoagulation   . Family history of adverse reaction to anesthesia    all children - PONV  . Generalized osteoarthrosis, unspecified site   . GERD (gastroesophageal reflux disease)    pt. denies  . Hemorrhage of rectum and anus   . History of bronchitis   . HTN (hypertension)   . Hypercholesteremia    pt. denies  . Hypertonicity of bladder   . PAF (paroxysmal atrial fibrillation) (HCC) paf   CHADS2VASC score is 4  . Pancreatitis, gallstone   . PONV (postoperative nausea and vomiting)   . Presence of permanent cardiac pacemaker   . Renal cell cancer ([place])    pt. denies  . Tachy-brady syndrome (HCC)    s/p PPM  . Urinary incontinence   . Wears glasses    reading  . Wears partial dentures    upper partial   Past Surgical History:  Past Surgical History:  Procedure Laterality Date  . ABDOMINAL HYSTERECTOMY    . COLONOSCOPY    . LAPAROSCOPIC CHOLECYSTECTOMY  2007   lap choli  . left knee replacement - Dr. French Ana  2012   lt total knee  . PACEMAKER GENERATOR CHANGE  04/2013   MDT ADDRL1 pacemaker implanted by Dr Lovena Le  . PACEMAKER INSERTION  2007  . PERMANENT PACEMAKER GENERATOR CHANGE N/A 05/03/2013   Procedure: PERMANENT PACEMAKER GENERATOR CHANGE;  Surgeon: Evans Lance, MD;  Location: [facility] CATH LAB;  Service: Cardiovascular;  Laterality: N/A;  . PROXIMAL INTERPHALANGEAL FUSION (PIP) Right 08/22/2013   Procedure: EXCISION MUCOID CYST DEBRIDEMENT PROXIMAL INTERPHALANGEAL JOINT;  Surgeon: Wynonia Sours, MD;  Location: [facility];  Service: Orthopedics;  Laterality: Right;  . THUMB  ARTHROSCOPY  2007   right  . TOTAL ABDOMINAL HYSTERECTOMY W/ BILATERAL SALPINGOOPHORECTOMY    . TOTAL KNEE ARTHROPLASTY Right 10/03/2015   Procedure: RIGHT TOTAL KNEE ARTHROPLASTY;  Surgeon: Earlie Server, MD;  Location: [facility];  Service: Orthopedics;  Laterality: Right;  . TUBAL LIGATION    . VEIN LIGATION     HPI:  Molly Taylor a 83 y.o.femalewith history of tachybradycardia syndrome status post pacemaker placement, paroxysmal atrial fibrillation, GERD, hypertension was brought to the ER the patient had persistent cough over the last few weeks. Chest x-ray done which showed pneumonia. CXR 7/1 progressively worsening patchy bibasilar airspace opacities, concerning for multifocal pneumonia.   Assessment / Plan / Recommendation Clinical Impression  Pt exhibits indications of suspected esophageal dysphagia via clinical observation and pt report of "food coming back up" (for several months). No s/s aspiration. Globus sensation following larger pill with nursing observed by this SLP. Post prandial aspiration is possible. Educated pt and 2 daughters to stay upright after meals, liquids throughout meals, elevate HOB at night, smaller more frequent meals if needed. Continue regular texture and thin liquids. No further ST needed.  SLP Visit Diagnosis: Dysphagia, unspecified (R13.10)    Aspiration Risk  Mild aspiration risk    Diet Recommendation Regular;Thin liquid   Liquid Administration via: Cup;Straw Medication Administration: Whole meds with liquid Supervision: Patient able to self feed Compensations: Follow solids with liquid Postural Changes: Seated upright at  90 degrees;Remain upright for at least 30 minutes after po intake    Other  Recommendations Oral Care Recommendations: Oral care BID   Follow up Recommendations None      Frequency and Duration            Prognosis        Swallow Study   General HPI: Molly Taylor a 83 y.o.femalewith history of  tachybradycardia syndrome status post pacemaker placement, paroxysmal atrial fibrillation, GERD, hypertension was brought to the ER the patient had persistent cough over the last few weeks. Chest x-ray done which showed pneumonia. CXR 7/1 progressively worsening patchy bibasilar airspace opacities, concerning for multifocal pneumonia. Type of Study: Bedside Swallow Evaluation Previous Swallow Assessment:  (none) Diet Prior to this Study: Regular;Thin liquids Temperature Spikes Noted: Yes Respiratory Status: Nasal cannula History of Recent Intubation: No Behavior/Cognition: Alert;Cooperative;Pleasant mood Oral Cavity Assessment: Within Functional Limits Oral Care Completed by SLP: No Oral Cavity - Dentition:  (partial upper plate, natural lower) Vision: Functional for self-feeding Self-Feeding Abilities: Able to feed self Patient Positioning: Upright in chair Baseline Vocal Quality: Normal Volitional Cough: Strong Volitional Swallow: Able to elicit    Oral/Motor/Sensory Function Overall Oral Motor/Sensory Function: Within functional limits   Ice Chips Ice chips: Not tested   Thin Liquid Thin Liquid: Within functional limits Presentation: Cup;Straw    Nectar Thick Nectar Thick Liquid: Not tested   Honey Thick Honey Thick Liquid: Not tested   Puree Puree: Within functional limits   Solid   GO   Solid: Within functional limits        Molly Taylor 09/23/2016,11:04 AM  [name] Colvin Caroli.[facility] [employer] [phone]

## 2016-09-23 NOTE — Progress Notes (Signed)
PROGRESS NOTE    Molly Taylor  ZOX:[id number] DOB: 04-10-1926 DOA: 09/19/2016 PCP: Alroy Dust, L.Marlou Sa, MD   Brief Narrative: Molly Taylor is a 83 y.o. female with a history of tachycardia bradycardia syndrome status post pacemaker, paroxysmal atrial fibrillation, and hypertension. She presents with pneumonia with failed outpatient treatment. She started on Levaquin IV. Blood culture and sputum cultures pending. Developed oxygen requirement.   Assessment & Plan:   Principal Problem:   CAP (community acquired pneumonia) Active Problems:   HTN (hypertension)   Tachy-brady syndrome (HCC)   Pacemaker   Multi Lobar pneumonia, aspiration PNA, related to reflux.  Seen on x-ray and CT scan. Failed outpatient treatment of amoxicillin, doxycycline. No evidence of aspiration symptoms per patient. -change Levaquin to Unasyn due to patient age  -flutter valve -incentive spirometer -blood/sputum culture -CT with bilateral air space diseases, ground glass opacity.  -continue with  duo neb and  Pulmicort for bronchospasm, bronchitis.  -speech evaluation. Esophageal dysphagia. Started on protonix.  Report improvement of cough   Acute respiratory failure with hypoxia Secondary to pneumonia. If no improvement will need to consider effect from amiodarone.  -wean to room air. -will need home oxygen. Ordered place.   Ascending aorta dilation Stable from CT scan from last year. Daughter has had two surgeries for aortic aneurysms with rupture. -patient will follow-up with cardiologist. Will need yearly monitoring.  Tachy-brady syndrome Paroxysmal a-fib Patient with a pacemaker -continue metoprolol/diltiazem/amiodarone -continue Eliquis  Essential hypertension -continue metoprolol -continue diltiazem  Oral thrush;  Nystatin   DVT prophylaxis: Eliquis Code Status: Full code Family Communication: Two daughters at bedside Disposition Plan: Discharge in 24-48 hours, likely  home   Consultants:   None  Procedures:   None  Antimicrobials:  Levaquin   Subjective: Patient is feeling better. She cough less last night.  She is breathing better   Objective: Vitals:   09/22/16 2133 09/23/16 0505 09/23/16 0736 09/23/16 1043  BP: 139/72 112/62  (!) 119/57  Pulse: 64 63    Resp: 18 18    Temp: 98.8 F (37.1 C) 99.6 F (37.6 C)    TempSrc: Oral Oral    SpO2: 99% 94% 92%   Weight:      Height:        Intake/Output Summary (Last 24 hours) at 09/23/16 1226 Last data filed at 09/23/16 0949  Gross per 24 hour  Intake              540 ml  Output                0 ml  Net              540 ml   Filed Weights   09/19/16 2120 09/20/16 0106  Weight: 71.2 kg (157 lb) 74 kg (163 lb 2.3 oz)    Examination:  General exam: NAD Respiratory system: Bilateral Air movement, fine crackles, sporadic wheezing.  Cardiovascular system: S 1, S 2 RRR Gastrointestinal system: BS present, soft, nt Central nervous system: non focal.  Extremities: No edema Skin: No cyanosis. No rashes Psychiatry: Mood and affect appropriate.     Data Reviewed: I have personally reviewed following labs and imaging studies  CBC:  Recent Labs Lab 09/19/16 2217 09/20/16 0125 09/23/16 0825  WBC 6.7 6.6 7.3  NEUTROABS 5.0  --   --   HGB 13.6 12.6 11.9*  HCT 38.1 35.7* 33.7*  MCV 89.6 89.3 89.6  PLT 190 180 811   Basic Metabolic Panel:  Recent Labs Lab 09/19/16 2217 09/20/16 0125 09/22/16 0858  NA 139 139 138  K 3.6 3.4* 3.9  CL 109 107 109  CO2 22 24 21*  GLUCOSE 113* 113* 137*  BUN 16 16 11   CREATININE 0.89 0.81 0.69  CALCIUM 9.4 9.2 9.0   GFR: Estimated Creatinine Clearance: 52.1 mL/min (by C-G formula based on SCr of 0.69 mg/dL). Liver Function Tests:  Recent Labs Lab 09/19/16 2217  AST 16  ALT 13*  ALKPHOS 66  BILITOT 0.9  PROT 7.3  ALBUMIN 3.3*   No results for input(s): LIPASE, AMYLASE in the last 168 hours. No results for input(s): AMMONIA  in the last 168 hours. Coagulation Profile: No results for input(s): INR, PROTIME in the last 168 hours. Cardiac Enzymes:  Recent Labs Lab 09/20/16 0522  TROPONINI <0.03   BNP (last 3 results) No results for input(s): PROBNP in the last 8760 hours. HbA1C: No results for input(s): HGBA1C in the last 72 hours. CBG:  Recent Labs Lab 09/22/16 0807 09/22/16 1231  GLUCAP 88 89   Lipid Profile: No results for input(s): CHOL, HDL, LDLCALC, TRIG, CHOLHDL, LDLDIRECT in the last 72 hours. Thyroid Function Tests: No results for input(s): TSH, T4TOTAL, FREET4, T3FREE, THYROIDAB in the last 72 hours. Anemia Panel: No results for input(s): VITAMINB12, FOLATE, FERRITIN, TIBC, IRON, RETICCTPCT in the last 72 hours. Sepsis Labs:  Recent Labs Lab 09/19/16 2301 09/20/16 0534  LATICACIDVEN 1.08 1.0    Recent Results (from the past 240 hour(s))  Blood Culture (routine x 2)     Status: None (Preliminary result)   Collection Time: 09/19/16 10:50 PM  Result Value Ref Range Status   Specimen Description BLOOD RIGHT ANTECUBITAL  Final   Special Requests   Final    BOTTLES DRAWN AEROBIC AND ANAEROBIC Blood Culture adequate volume   Culture   Final    NO GROWTH 2 DAYS Performed at [facility], [address], [place], [place] [address]    Report Status PENDING  Incomplete  Blood Culture (routine x 2)     Status: None (Preliminary result)   Collection Time: 09/20/16 12:18 AM  Result Value Ref Range Status   Specimen Description BLOOD LEFT ANTECUBITAL  Final   Special Requests   Final    BOTTLES DRAWN AEROBIC AND ANAEROBIC Blood Culture adequate volume   Culture   Final    NO GROWTH 2 DAYS Performed at [facility], [place] [address] [place],  [address]    Report Status PENDING  Incomplete  MRSA PCR Screening     Status: None   Collection Time: 09/20/16  3:20 PM  Result Value Ref Range Status   MRSA by PCR NEGATIVE NEGATIVE Final    Comment:        The GeneXpert  MRSA Assay (FDA approved for NASAL specimens only), is one component of a comprehensive MRSA colonization surveillance program. It is not intended to diagnose MRSA infection nor to guide or monitor treatment for MRSA infections.          Radiology Studies: No results found.      Scheduled Meds: . amiodarone  200 mg Oral Daily  . apixaban  5 mg Oral BID  . budesonide (PULMICORT) nebulizer solution  0.25 mg Nebulization BID  . diltiazem  240 mg Oral Daily  . ipratropium-albuterol  3 mL Nebulization TID  . metoprolol succinate  25 mg Oral Daily  . nystatin  5 mL Oral QID  . pantoprazole  40 mg Oral BID  . polyethylene glycol  17 g Oral Daily   Continuous Infusions: . ampicillin-sulbactam (UNASYN) IV Stopped (09/23/16 0540)     LOS: 3 days     Aprile Dickenson, Cassie Freer, MD [facility] 09/23/2016, 12:26 PM Pager: [phone]  If 7PM-7AM, please contact night-coverage [email or web address] Password [email or web address] 09/23/2016, 12:26 PM

## 2016-09-23 NOTE — Progress Notes (Signed)
Physical Therapy Treatment Patient Details Name: Molly Taylor MRN: [id number] DOB: 1926-08-22 Today's Date: 09/23/2016    History of Present Illness 83 yo female admitted with Pna. Hx of tachybrady syndrome, pacemaker, afib, htn, R TKA 2017    PT Comments    Pt reports feeling better today and tolerated ambulation well.  Pt continues to require supplemental oxygen. SATURATION QUALIFICATIONS: (This note is used to comply with regulatory documentation for home oxygen)  Patient Saturations on Room Air at Rest = 90%  Patient Saturations on Room Air while Ambulating = 86%  Patient Saturations on 2 Liters of oxygen while Ambulating = 90%  Please briefly explain why patient needs home oxygen: to improve oxygen saturations above 88% during physical activity such as ambulation.   Follow Up Recommendations  Home health PT;Supervision - Intermittent     Equipment Recommendations  [name] (qualifies for supplemental oxygen)    Recommendations for Other Services       Precautions / Restrictions Precautions Precautions: Fall Precaution Comments: monitor sats    Mobility  Bed Mobility Overal bed mobility: Modified Independent                Transfers Overall transfer level: Needs assistance Equipment used: None Transfers: Sit to/from Stand Sit to Stand: Min guard         General transfer comment: increased time, uses UEs to self assist  Ambulation/Gait Ambulation/Gait assistance: Min guard Ambulation Distance (Feet): 160 Feet Assistive device: None Gait Pattern/deviations: Step-through pattern;Decreased stride length     General Gait Details: pt did not need to use hand rail today, pt required supplemental oxygen, pt reports improved tolerance to ambulation   Stairs            Wheelchair Mobility    Modified Rankin (Stroke Patients Only)       Balance                                            Cognition Arousal/Alertness:  Awake/alert Behavior During Therapy: WFL for tasks assessed/performed Overall Cognitive Status: Within Functional Limits for tasks assessed                                        Exercises      General Comments        Pertinent Vitals/Pain Pain Assessment: No/denies pain    Home Living                      Prior Function            PT Goals (current goals can now be found in the care plan section) Progress towards PT goals: Progressing toward goals    Frequency    Min 3X/week      PT Plan Current plan remains appropriate    Co-evaluation              AM-PAC PT "6 Clicks" Daily Activity  Outcome Measure  Difficulty turning over in bed (including adjusting bedclothes, sheets and blankets)?: None Difficulty moving from lying on back to sitting on the side of the bed? : None Difficulty sitting down on and standing up from a chair with arms (Taylor.g., wheelchair, bedside commode, etc,.)?: A Little Help needed moving to and from a bed  to chair (including a wheelchair)?: A Little Help needed walking in hospital room?: A Little Help needed climbing 3-5 steps with a railing? : A Little 6 Click Score: 20    End of Session Equipment Utilized During Treatment: Gait belt;Oxygen Activity Tolerance: Patient tolerated treatment well Patient left: with call bell/phone within reach;with family/visitor present;in chair Nurse Communication: Mobility status PT Visit Diagnosis: Muscle weakness (generalized) (M62.81);Difficulty in walking, not elsewhere classified (R26.2)     Time: 1010-1027 PT Time Calculation (min) (ACUTE ONLY): 17 min  Charges:  $Gait Training: 8-22 mins                    G Codes:       Molly Taylor, PT, DPT 09/23/2016 Pager: [phone]  Molly Taylor 09/23/2016, 11:57 AM

## 2016-09-24 MED ORDER — AMOXICILLIN-POT CLAVULANATE 875-125 MG PO TABS: 1.0000 | ORAL_TABLET | Freq: Two times a day (BID) | ORAL | Status: DC

## 2016-09-24 MED ORDER — AMOXICILLIN-POT CLAVULANATE 875-125 MG PO TABS: 1.0000 | ORAL_TABLET | Freq: Two times a day (BID) | ORAL | 0 refills | Status: DC

## 2016-09-24 MED ORDER — IPRATROPIUM-ALBUTEROL 0.5-2.5 (3) MG/3ML IN SOLN: 3.0000 mL | Freq: Three times a day (TID) | RESPIRATORY_TRACT | 0 refills | Status: DC

## 2016-09-24 MED ORDER — PANTOPRAZOLE SODIUM 40 MG PO TBEC: 40.0000 mg | DELAYED_RELEASE_TABLET | Freq: Two times a day (BID) | ORAL | 0 refills | Status: AC

## 2016-09-24 MED ORDER — BUDESONIDE 0.25 MG/2ML IN SUSP: 0.2500 mg | Freq: Two times a day (BID) | RESPIRATORY_TRACT | 12 refills | Status: DC

## 2016-09-24 MED ORDER — POLYETHYLENE GLYCOL 3350 17 G PO PACK: 17.0000 g | PACK | Freq: Every day | ORAL | 0 refills | Status: AC

## 2016-09-24 NOTE — Care Management Important Message (Signed)
Important Message  Patient Details  Name: Molly Taylor MRN: [id number] Date of Birth: [birth year]   Medicare Important Message Given:  Yes    Erenest Rasher, RN 09/24/2016, 12:58 PM

## 2016-09-24 NOTE — Discharge Summary (Signed)
Physician Discharge Summary  Molly Taylor JME:[id number] DOB: [birth year] DOA: 09/19/2016  PCP: Alroy Dust, L.Marlou Sa, MD  Admit date: 09/19/2016 Discharge date: 09/24/2016  Admitted From: Home  Disposition:  Home   Recommendations for Outpatient Follow-up:  1. Follow up with PCP in 1-2 weeks 2. Please obtain BMP/CBC in one week 3. Needs referral to GI for evaluation of reflux and Pulmonologist   Home Health: yes  Equipment/Devices: oxygen   Discharge Condition: stable.  CODE STATUS:= full code.  Diet recommendation: Heart Healthy  Brief/Interim Summary: Molly Taylor is a 83 y.o. female with a history of tachycardia bradycardia syndrome status post pacemaker, paroxysmal atrial fibrillation, and hypertension. She presents with pneumonia with failed outpatient treatment. She started on Levaquin IV. Blood culture and sputum cultures pending. Developed oxygen requirement.   Assessment & Plan:   Principal Problem:   CAP (community acquired pneumonia) Active Problems:   HTN (hypertension)   Tachy-brady syndrome (HCC)   Pacemaker   Multi Lobar pneumonia, Aspiration PNA, related to GERD.  Seen on x-ray and CT scan. Failed outpatient treatment of amoxicillin, doxycycline. No evidence of aspiration symptoms per patient. -change Levaquin to Unasyn due to patient age. Discharge on Augmentin  -flutter valve -incentive spirometer -blood/sputum culture -CT with bilateral air space diseases, ground glass opacity. Needs referral to pulmonologist .  -continue with  duo neb and  Pulmicort for bronchospasm, bronchitis.  -speech evaluation. Esophageal dysphagia. Started on protonix. Follow up with GI  Report improvement of cough   Acute respiratory failure with hypoxia Secondary to pneumonia. If no improvement will need to consider effect from amiodarone.  -wean to room air. -discharge on home oxygen,   Ascending aorta dilation Stable from CT scan from last year. Daughter has had  two surgeries for aortic aneurysms with rupture. -patient will follow-up with cardiologist. Will need yearly monitoring.  Tachy-brady syndrome Paroxysmal a-fib Patient with a pacemaker -continue metoprolol/diltiazem/amiodarone -continue Eliquis  Essential hypertension -continue metoprolol -continue diltiazem  Oral thrush;  Nystatin   Discharge Diagnoses:  Principal Problem:   CAP (community acquired pneumonia) Active Problems:   HTN (hypertension)   Tachy-brady syndrome ([place])   Pacemaker    Discharge Instructions  Discharge Instructions    Diet - low sodium heart healthy    Complete by:  As directed    Increase activity slowly    Complete by:  As directed      Allergies as of 09/24/2016      Reactions   Sulfa Antibiotics Swelling   Codeine Nausea And Vomiting      Medication List    STOP taking these medications   amoxicillin 500 MG capsule Commonly known as:  AMOXIL   doxycycline 100 MG capsule Commonly known as:  VIBRAMYCIN     TAKE these medications   amiodarone 200 MG tablet Commonly known as:  PACERONE Take 1 tablet (200 mg total) by mouth daily.   amoxicillin-clavulanate 875-125 MG tablet Commonly known as:  AUGMENTIN Take 1 tablet by mouth every 12 (twelve) hours.   apixaban 5 MG Tabs tablet Commonly known as:  ELIQUIS Take 1 tablet (5 mg total) by mouth 2 (two) times daily. These are correct directions. Last rx said 1 po daily. Cannot refill till 11/15/2015   budesonide 0.25 MG/2ML nebulizer solution Commonly known as:  PULMICORT Take 2 mLs (0.25 mg total) by nebulization 2 (two) times daily.   CARTIA XT 240 MG 24 hr capsule Generic drug:  diltiazem Take 1 capsule (240 mg total) by mouth  daily.   ipratropium-albuterol 0.5-2.5 (3) MG/3ML Soln Commonly known as:  DUONEB Take 3 mLs by nebulization 3 (three) times daily.   loratadine 10 MG tablet Commonly known as:  CLARITIN Take 10 mg by mouth daily as needed for allergies.    metoprolol succinate 25 MG 24 hr tablet Commonly known as:  TOPROL-XL Take 1 tablet (25 mg total) by mouth daily.   pantoprazole 40 MG tablet Commonly known as:  PROTONIX Take 1 tablet (40 mg total) by mouth 2 (two) times daily.   polyethylene glycol packet Commonly known as:  MIRALAX / GLYCOLAX Take 17 g by mouth daily. Start taking on:  09/25/2016            Durable Medical Equipment        Start     Ordered   09/23/16 1145  For home use only DME oxygen  Once    Question Answer Comment  Mode or (Route) Nasal cannula   Liters per Minute 2   Frequency Continuous (stationary and portable oxygen unit needed)   Oxygen delivery system Gas      09/23/16 1144   09/23/16 1145  For home use only DME Cane  Once     09/23/16 1144   09/23/16 1144  For home use only DME Nebulizer machine  Once    Question:  Patient needs a nebulizer to treat with the following condition  Answer:  Bronchospasm with bronchitis, acute   09/23/16 1144      Allergies  Allergen Reactions  . Sulfa Antibiotics Swelling  . Codeine Nausea And Vomiting    Consultations:  none   Procedures/Studies: Dg Chest 2 View  Result Date: 09/19/2016 CLINICAL DATA:  Acute onset of congested cough and shortness of breath. Initial encounter. EXAM: CHEST  2 VIEW COMPARISON:  Chest radiograph performed 09/16/2016 FINDINGS: The lungs are well-aerated. Patchy bilateral airspace opacities are progressively worsening and concerning for multifocal pneumonia. There is no evidence of pleural effusion or pneumothorax. The heart is mildly enlarged. Mild vascular congestion is noted. A pacemaker is noted at the left chest wall, with leads ending at the right atrium and right ventricle. No acute osseous abnormalities are seen. IMPRESSION: 1. Progressively worsening patchy bibasilar airspace opacities, concerning for multifocal pneumonia. 2. Mild cardiomegaly and mild vascular congestion. Electronically Signed   By: Garald Balding  M.D.   On: 09/19/2016 23:06   Dg Chest 2 View  Result Date: 09/16/2016 CLINICAL DATA:  Cough.  Shortness of breath. EXAM: CHEST  2 VIEW COMPARISON:  09/06/2016. FINDINGS: Cardiac pacer with lead tips in the right atrium and right ventricle. Cardiomegaly with normal pulmonary vascularity. Bilateral pulmonary infiltrates with small pleural effusions noted. Pulmonary infiltrates have progressed from prior exam. Surgical clips right upper quadrant. IMPRESSION: 1. Progressive bilateral pulmonary infiltrates and subsegmental atelectasis. Small bilateral pleural effusions. 2. Cardiac pacer in stable position.  Stable cardiomegaly. Electronically Signed   By: Marcello Moores  Register   On: 09/16/2016 11:30   Dg Chest 2 View  Result Date: 09/06/2016 CLINICAL DATA:  Cough and dizziness.  Hypertension. EXAM: CHEST  2 VIEW COMPARISON:  February 19, 2016 FINDINGS: There is patchy infiltrate in the left mid lung. There is mild bibasilar atelectasis. There is a small left pleural effusion. Lungs elsewhere are clear. Heart is upper normal in size with pacemaker leads attached to right atrium and right ventricle. There is aortic atherosclerosis. No pneumothorax. No adenopathy. There is postoperative change in the lower cervical spine. IMPRESSION: Subtle patchy infiltrate  left mid lung, likely early pneumonia. Bibasilar atelectasis with minimal left pleural effusion. Pacemaker leads attached to right atrium and right ventricle. There is aortic atherosclerosis. Followup PA and lateral chest radiographs recommended in 3-4 weeks following trial of antibiotic therapy to ensure resolution and exclude underlying malignancy. These results will be called to the ordering clinician or representative by the Radiologist Assistant, and communication documented in the PACS or zVision Dashboard. Electronically Signed   By: Lowella Grip III M.D.   On: 09/06/2016 17:25   Ct Angio Chest Pe W Or Wo Contrast  Result Date: 09/20/2016 CLINICAL  DATA:  Dyspnea.  History of renal cell carcinoma. EXAM: CT ANGIOGRAPHY CHEST WITH CONTRAST TECHNIQUE: Multidetector CT imaging of the chest was performed using the standard protocol during bolus administration of intravenous contrast. Multiplanar CT image reconstructions and MIPs were obtained to evaluate the vascular anatomy. CONTRAST:  100 cc Isovue 370 intravenously COMPARISON:  Chest radiograph 09/19/2016 FINDINGS: Cardiovascular: Satisfactory opacification of the pulmonary arteries to the segmental level. No evidence of pulmonary embolism. Enlarged heart. No pericardial effusion. Calcific atherosclerotic disease of the aorta and coronary arteries. Mild dilation of the ascending aorta measuring 4.2 cm in greatest dimension. Mediastinum/Nodes: No enlarged mediastinal, hilar, or axillary lymph nodes. Thyroid gland, trachea, and esophagus demonstrate no significant findings. Lungs/Pleura: Multifocal patchy airspace consolidation in lingula and bilateral lower lobes. Additional areas of ground-glass consolidation in bilateral upper lobes. Upper Abdomen: No acute abnormality. Musculoskeletal: No chest wall abnormality. No acute or significant osseous findings. Cardiac pacemaker within the left chest wall with intact leads. Review of the MIP images confirms the above findings. IMPRESSION: Bilateral lower lobe patchy airspace consolidation which may represent multilobar pneumonia. Aspiration pneumonia is also on the differential diagnosis. Additional nonspecific areas of ground-glass opacity in bilateral upper lobes. In the acute clinical settings, these may represent infectious or inflammatory consolidation. However follow-up is recommended after resolution of the acute symptoms. No evidence of pulmonary embolus. Mild dilation of the ascending aorta measuring 4.2 cm. Recommend annual imaging followup by CTA or MRA. This recommendation follows 2010 ACCF/AHA/AATS/ACR/ASA/SCA/SCAI/SIR/STS/SVM Guidelines for the Diagnosis  and Management of Patients with Thoracic Aortic Disease. Circulation. 2010; 121: X902-I097 Electronically Signed   By: Fidela Salisbury M.D.   On: 09/20/2016 12:15       Subjective: Cough is better.  She is following aspiration precaution and reflux precaution.    Discharge Exam: Vitals:   09/23/16 2111 09/24/16 0443  BP: (!) 122/55 125/64  Pulse: 62 60  Resp: 18 18  Temp: 99.1 F (37.3 C) 99.1 F (37.3 C)   Vitals:   09/23/16 2006 09/23/16 2111 09/24/16 0443 09/24/16 0858  BP:  (!) 122/55 125/64   Pulse:  62 60   Resp:  18 18   Temp:  99.1 F (37.3 C) 99.1 F (37.3 C)   TempSrc:  Oral Oral   SpO2: 92% 95% 92% 95%  Weight:   77.6 kg (171 lb 1.2 oz)   Height:        General: Pt is alert, awake, not in acute distress Cardiovascular: RRR, S1/S2 +, no rubs, no gallops Respiratory: CTA bilaterally, no wheezing, no rhonchi Abdominal: Soft, NT, ND, bowel sounds + Extremities: no edema, no cyanosis    The results of significant diagnostics from this hospitalization (including imaging, microbiology, ancillary and laboratory) are listed below for reference.     Microbiology: Recent Results (from the past 240 hour(s))  Blood Culture (routine x 2)     Status: None (  Preliminary result)   Collection Time: 09/19/16 10:50 PM  Result Value Ref Range Status   Specimen Description BLOOD RIGHT ANTECUBITAL  Final   Special Requests   Final    BOTTLES DRAWN AEROBIC AND ANAEROBIC Blood Culture adequate volume   Culture   Final    NO GROWTH 3 DAYS Performed at [facility], [address] [place], [place] [address]    Report Status PENDING  Incomplete  Blood Culture (routine x 2)     Status: None (Preliminary result)   Collection Time: 09/20/16 12:18 AM  Result Value Ref Range Status   Specimen Description BLOOD LEFT ANTECUBITAL  Final   Special Requests   Final    BOTTLES DRAWN AEROBIC AND ANAEROBIC Blood Culture adequate volume   Culture   Final    NO GROWTH 3  DAYS Performed at [facility], [address] [place], [place] [address]    Report Status PENDING  Incomplete  MRSA PCR Screening     Status: None   Collection Time: 09/20/16  3:20 PM  Result Value Ref Range Status   MRSA by PCR NEGATIVE NEGATIVE Final    Comment:        The GeneXpert MRSA Assay (FDA approved for NASAL specimens only), is one component of a comprehensive MRSA colonization surveillance program. It is not intended to diagnose MRSA infection nor to guide or monitor treatment for MRSA infections.      Labs: BNP (last 3 results)  Recent Labs  09/19/16 2218  BNP 299.2*   Basic Metabolic Panel:  Recent Labs Lab 09/19/16 2217 09/20/16 0125 09/22/16 0858  NA 139 139 138  K 3.6 3.4* 3.9  CL 109 107 109  CO2 22 24 21*  GLUCOSE 113* 113* 137*  BUN 16 16 11   CREATININE 0.89 0.81 0.69  CALCIUM 9.4 9.2 9.0   Liver Function Tests:  Recent Labs Lab 09/19/16 2217  AST 16  ALT 13*  ALKPHOS 66  BILITOT 0.9  PROT 7.3  ALBUMIN 3.3*   No results for input(s): LIPASE, AMYLASE in the last 168 hours. No results for input(s): AMMONIA in the last 168 hours. CBC:  Recent Labs Lab 09/19/16 2217 09/20/16 0125 09/23/16 0825  WBC 6.7 6.6 7.3  NEUTROABS 5.0  --   --   HGB 13.6 12.6 11.9*  HCT 38.1 35.7* 33.7*  MCV 89.6 89.3 89.6  PLT 190 180 181   Cardiac Enzymes:  Recent Labs Lab 09/20/16 0522  TROPONINI <0.03   BNP: Invalid input(s): POCBNP CBG:  Recent Labs Lab 09/22/16 0807 09/22/16 1231  GLUCAP 88 89   D-Dimer No results for input(s): DDIMER in the last 72 hours. Hgb A1c No results for input(s): HGBA1C in the last 72 hours. Lipid Profile No results for input(s): CHOL, HDL, LDLCALC, TRIG, CHOLHDL, LDLDIRECT in the last 72 hours. Thyroid function studies No results for input(s): TSH, T4TOTAL, T3FREE, THYROIDAB in the last 72 hours.  Invalid input(s): FREET3 Anemia work up No results for input(s): VITAMINB12, FOLATE,  FERRITIN, TIBC, IRON, RETICCTPCT in the last 72 hours. Urinalysis    Component Value Date/Time   COLORURINE YELLOW 09/19/2016 2304   APPEARANCEUR CLEAR 09/19/2016 2304   LABSPEC 1.013 09/19/2016 2304   PHURINE 5.0 09/19/2016 2304   GLUCOSEU NEGATIVE 09/19/2016 2304   HGBUR MODERATE (A) 09/19/2016 2304   BILIRUBINUR NEGATIVE 09/19/2016 2304   KETONESUR NEGATIVE 09/19/2016 2304   PROTEINUR NEGATIVE 09/19/2016 2304   UROBILINOGEN 0.2 03/16/2011 1529  NITRITE NEGATIVE 09/19/2016 2304   LEUKOCYTESUR NEGATIVE 09/19/2016 2304   Sepsis Labs Invalid input(s): PROCALCITONIN,  WBC,  LACTICIDVEN Microbiology Recent Results (from the past 240 hour(s))  Blood Culture (routine x 2)     Status: None (Preliminary result)   Collection Time: 09/19/16 10:50 PM  Result Value Ref Range Status   Specimen Description BLOOD RIGHT ANTECUBITAL  Final   Special Requests   Final    BOTTLES DRAWN AEROBIC AND ANAEROBIC Blood Culture adequate volume   Culture   Final    NO GROWTH 3 DAYS Performed at [facility], [address], [place], [place] [address]    Report Status PENDING  Incomplete  Blood Culture (routine x 2)     Status: None (Preliminary result)   Collection Time: 09/20/16 12:18 AM  Result Value Ref Range Status   Specimen Description BLOOD LEFT ANTECUBITAL  Final   Special Requests   Final    BOTTLES DRAWN AEROBIC AND ANAEROBIC Blood Culture adequate volume   Culture   Final    NO GROWTH 3 DAYS Performed at [facility], [place] [address] [place], [place] [address]    Report Status PENDING  Incomplete  MRSA PCR Screening     Status: None   Collection Time: 09/20/16  3:20 PM  Result Value Ref Range Status   MRSA by PCR NEGATIVE NEGATIVE Final    Comment:        The GeneXpert MRSA Assay (FDA approved for NASAL specimens only), is one component of a comprehensive MRSA colonization surveillance program. It is not intended to diagnose MRSA infection nor to guide or monitor  treatment for MRSA infections.      Time coordinating discharge: Over 30 minutes  SIGNED:   Elmarie Shiley, MD  [facility] 09/24/2016, 11:40 AM Pager   If 7PM-7AM, please contact night-coverage [email or web address] Password [email or web address]

## 2016-09-24 NOTE — Care Management Note (Signed)
Case Management Note  Patient Details  Name: [name] MRN: [id number] Date of Birth: 11/26/1926  Subjective/Objective:   CAP, HTN, Tachy-brady syndrome, Pacemaker                   Action/Plan: Discharge Planning: NCM spoke to pt and dtrs at bedside. Pt lives in home with husband who is currently at [facility] rehab. Dtrs will assist with care as needed. Pt has hospital bed, wheelchair, RW and bedside commode at home. Contacted [facility] DME rep for oxygen, cane and neb machine. Information on [facility] and light weight portable added to dc instructions. Explained to dtr to follow up with [facility] office to set up appt to meet with RT. Offered choice for HH/list provided. Dtr's requested [facility] for [facility].   PCP Donnie Coffin MD  Expected Discharge Date:  09/24/16               Expected Discharge Plan:  [place]  In-House Referral:  NA  Discharge planning Services  CM Consult  Post Acute Care Choice:  Home Health Choice offered to:  Adult Children  DME Arranged:  Cane, Chiropodist, Walker rolling DME Agency:  [place]:  PT [place] Agency:  [place]  Status of Service:  Completed, signed off  If discussed at [place] of Stay Meetings, dates discussed:    Additional Comments:  Erenest Rasher, RN 09/24/2016, 12:54 PM

## 2016-09-24 NOTE — Progress Notes (Signed)
Physical Therapy Treatment Patient Details Name: Molly Taylor MRN: [id number] DOB: 04-23-1926 Today's Date: 09/24/2016    History of Present Illness 83 yo female admitted with Pna. Hx of tachybrady syndrome, pacemaker, afib, htn, R TKA 2017    PT Comments      Pt reports feeling better today and tolerated ambulation well.  Pt continues to require supplemental oxygen. SATURATION QUALIFICATIONS: (This note is used to comply with regulatory documentation for home oxygen)  Patient Saturations on Room Air at Rest = 90%  Patient Saturations on Room Air while Ambulating = 82%  Patient Saturations on 2 Liters of oxygen while Ambulating = 90%  Please briefly explain why patient needs home oxygen: to improve oxygen saturations above 88% during physical activity such as ambulation.         Follow Up Recommendations  Home health PT;Supervision - Intermittent     Equipment Recommendations  Cane    Recommendations for Other Services       Precautions / Restrictions Precautions Precautions: Fall Precaution Comments: monitor sats Restrictions Weight Bearing Restrictions: No    Mobility  Bed Mobility Overal bed mobility: Modified Independent                Transfers Overall transfer level: Needs assistance Equipment used: None Transfers: Sit to/from Stand Sit to Stand: Min guard         General transfer comment: increased time, uses UEs to self assist  Ambulation/Gait Ambulation/Gait assistance: Min guard Ambulation Distance (Feet): 200 Feet Assistive device: None Gait Pattern/deviations: Step-through pattern;Decreased stride length     General Gait Details: pt did not need to use hand rail today, pt required supplemental oxygen, pt reports improved tolerance to ambulation as improved distance to 200 feet   Stairs            Wheelchair Mobility    Modified Rankin (Stroke Patients Only)       Balance                                            Cognition Arousal/Alertness: Awake/alert Behavior During Therapy: WFL for tasks assessed/performed Overall Cognitive Status: Within Functional Limits for tasks assessed                                        Exercises      General Comments        Pertinent Vitals/Pain Pain Assessment: No/denies pain    Home Living Family/patient expects to be discharged to:: Private residence Living Arrangements: Alone                  Prior Function            PT Goals (current goals can now be found in the care plan section) Acute Rehab PT Goals PT Goal Formulation: With patient Time For Goal Achievement: 10/05/16 Potential to Achieve Goals: Good Progress towards PT goals: Progressing toward goals    Frequency    Min 3X/week      PT Plan Current plan remains appropriate    Co-evaluation              AM-PAC PT "6 Clicks" Daily Activity  Outcome Measure  Difficulty turning over in bed (including adjusting bedclothes, sheets and blankets)?: None Difficulty moving from  lying on back to sitting on the side of the bed? : None Difficulty sitting down on and standing up from a chair with arms (e.g., wheelchair, bedside commode, etc,.)?: A Little Help needed moving to and from a bed to chair (including a wheelchair)?: A Little Help needed walking in hospital room?: A Little Help needed climbing 3-5 steps with a railing? : A Little 6 Click Score: 20    End of Session Equipment Utilized During Treatment: Gait belt;Oxygen Activity Tolerance: Patient tolerated treatment well Patient left: with call bell/phone within reach;with family/visitor present;in chair Nurse Communication: Mobility status (Nurse observed pt while ambulating) PT Visit Diagnosis: Muscle weakness (generalized) (M62.81);Difficulty in walking, not elsewhere classified (R26.2)     Time: 6503-5465 PT Time Calculation (min) (ACUTE ONLY): 16 min  Charges:   $Gait Training: 8-22 mins                    G Codes:       Reino Bellis, SPT   Reino Bellis 09/24/2016, 1:08 PM

## 2016-09-24 NOTE — Progress Notes (Signed)
Pharmacy Antibiotic Note  Molly Taylor is a 83 y.o. female presented to the Ed on 09/19/2016 with c/o cough and dizziness.  CXR with concern for multifocal PNA.  Levaquin was started on admission.  To change abx to unasyn for suspected aspiration PNA.   09/24/2016 T 99.1 Scr 0.69, CrCl ~ 61mls/min WBC 7.3  Plan: - continue unasyn 3 gm IV q6h  _____________________________  Height: 5\' 7"  (170.2 cm) Weight: 171 lb 1.2 oz (77.6 kg) IBW/kg (Calculated) : 61.6  Temp (24hrs), Avg:98.9 F (37.2 C), Min:98.6 F (37 C), Max:99.1 F (37.3 C)   Recent Labs Lab 09/19/16 2217 09/19/16 2301 09/20/16 0125 09/20/16 0534 09/22/16 0858 09/23/16 0825  WBC 6.7  --  6.6  --   --  7.3  CREATININE 0.89  --  0.81  --  0.69  --   LATICACIDVEN  --  1.08  --  1.0  --   --     Estimated Creatinine Clearance: 53.2 mL/min (by C-G formula based on SCr of 0.69 mg/dL).    Allergies  Allergen Reactions  . Sulfa Antibiotics Swelling  . Codeine Nausea And Vomiting    Antimicrobials this admission:  7/1 LVQ>>7/4 7/4 unasyn (asp PNA)>>  Dose adjustments this admission: --  Microbiology results: 7/1 BCx x2: ngtd 7/2 Urine strep: neg 7/2 Urine legionella: IP 7/2 MRSA PCR: neg  Thank you for allowing pharmacy to be a part of this patient's care.  Dolly Rias RPh 09/24/2016, 10:21 AM Pager [phone]

## 2016-09-24 NOTE — Plan of Care (Signed)
Problem: Education: Goal: Knowledge of [facility] Education information/materials will improve Outcome: Completed/Met Date Met: 09/24/16 .  Problem: Safety: Goal: Ability to remain free from injury will improve Outcome: Progressing .  Problem: Health Behavior/Discharge Planning: Goal: Ability to manage health-related needs will improve Outcome: Progressing .  Problem: Pain Managment: Goal: General experience of comfort will improve Outcome: Completed/Met Date Met: 09/24/16 Denies pain.  Problem: Physical Regulation: Goal: Ability to maintain clinical measurements within normal limits will improve Outcome: Progressing . Goal: Will remain free from infection Outcome: Progressing .  Problem: Skin Integrity: Goal: Risk for impaired skin integrity will decrease Outcome: Completed/Met Date Met: 09/24/16 .  Problem: Tissue Perfusion: Goal: Risk factors for ineffective tissue perfusion will decrease Outcome: Completed/Met Date Met: 09/24/16 .  Problem: Activity: Goal: Risk for activity intolerance will decrease Outcome: Progressing Up to bathroom with standby assist.   Problem: Fluid Volume: Goal: Ability to maintain a balanced intake and output will improve Outcome: Progressing .  Problem: Nutrition: Goal: Adequate nutrition will be maintained Outcome: Progressing .  Problem: Bowel/Gastric: Goal: Will not experience complications related to bowel motility Outcome: Completed/Met Date Met: 09/24/16 .

## 2016-09-25 LAB — CULTURE, BLOOD (ROUTINE X 2)
CULTURE: NO GROWTH
Culture: NO GROWTH
SPECIAL REQUESTS: ADEQUATE
Special Requests: ADEQUATE

## 2016-09-25 NOTE — Progress Notes (Signed)
0900 Received call from Dtr, Bartholomew Boards and pt did not received neb machine. Contacted [facility] to have neb machine delivered to home.   1030 Spoke to dtr, [place] and [facility] delivered [employer]. Jonnie Finner RN CCM Case Mgmt phone [phone]

## 2016-09-27 ENCOUNTER — Ambulatory Visit (INDEPENDENT_AMBULATORY_CARE_PROVIDER_SITE_OTHER): Payer: Medicare Other | Admitting: *Deleted

## 2016-09-27 DIAGNOSIS — K219 Gastro-esophageal reflux disease without esophagitis: Secondary | ICD-10-CM | POA: Diagnosis not present

## 2016-09-27 DIAGNOSIS — J189 Pneumonia, unspecified organism: Secondary | ICD-10-CM | POA: Diagnosis not present

## 2016-09-27 DIAGNOSIS — Z96651 Presence of right artificial knee joint: Secondary | ICD-10-CM | POA: Diagnosis not present

## 2016-09-27 DIAGNOSIS — I495 Sick sinus syndrome: Secondary | ICD-10-CM

## 2016-09-27 DIAGNOSIS — I10 Essential (primary) hypertension: Secondary | ICD-10-CM | POA: Diagnosis not present

## 2016-09-27 DIAGNOSIS — E785 Hyperlipidemia, unspecified: Secondary | ICD-10-CM | POA: Diagnosis not present

## 2016-09-27 DIAGNOSIS — Z9981 Dependence on supplemental oxygen: Secondary | ICD-10-CM | POA: Diagnosis not present

## 2016-09-27 DIAGNOSIS — I480 Paroxysmal atrial fibrillation: Secondary | ICD-10-CM | POA: Diagnosis not present

## 2016-09-27 NOTE — Progress Notes (Signed)
Remote pacemaker transmission.   

## 2016-09-28 DIAGNOSIS — K219 Gastro-esophageal reflux disease without esophagitis: Secondary | ICD-10-CM | POA: Diagnosis not present

## 2016-09-28 DIAGNOSIS — J189 Pneumonia, unspecified organism: Secondary | ICD-10-CM | POA: Diagnosis not present

## 2016-09-28 LAB — CUP PACEART REMOTE DEVICE CHECK
Battery Remaining Longevity: 100 mo
Implantable Lead Implant Date: 20070313
Implantable Lead Location: 753860
Implantable Pulse Generator Implant Date: 20150212
Lead Channel Pacing Threshold Amplitude: 0.875 V
Lead Channel Pacing Threshold Pulse Width: 0.4 ms
Lead Channel Setting Pacing Amplitude: 2 V
Lead Channel Setting Sensing Sensitivity: 5.6 mV
MDC IDC LEAD IMPLANT DT: 20070313
MDC IDC LEAD LOCATION: 753859
MDC IDC MSMT BATTERY IMPEDANCE: 233 Ohm
MDC IDC MSMT BATTERY VOLTAGE: 2.79 V
MDC IDC MSMT LEADCHNL RA IMPEDANCE VALUE: 486 Ohm
MDC IDC MSMT LEADCHNL RA PACING THRESHOLD AMPLITUDE: 0.75 V
MDC IDC MSMT LEADCHNL RA PACING THRESHOLD PULSEWIDTH: 0.4 ms
MDC IDC MSMT LEADCHNL RV IMPEDANCE VALUE: 619 Ohm
MDC IDC SESS DTM: 20180709144313
MDC IDC SET LEADCHNL RV PACING AMPLITUDE: 2.5 V
MDC IDC SET LEADCHNL RV PACING PULSEWIDTH: 0.4 ms
MDC IDC STAT BRADY AP VP PERCENT: 99 %
MDC IDC STAT BRADY AP VS PERCENT: 0 %
MDC IDC STAT BRADY AS VP PERCENT: 1 %
MDC IDC STAT BRADY AS VS PERCENT: 0 %

## 2016-09-29 DIAGNOSIS — E785 Hyperlipidemia, unspecified: Secondary | ICD-10-CM | POA: Diagnosis not present

## 2016-09-29 DIAGNOSIS — J189 Pneumonia, unspecified organism: Secondary | ICD-10-CM | POA: Diagnosis not present

## 2016-09-29 DIAGNOSIS — K219 Gastro-esophageal reflux disease without esophagitis: Secondary | ICD-10-CM | POA: Diagnosis not present

## 2016-09-29 DIAGNOSIS — I480 Paroxysmal atrial fibrillation: Secondary | ICD-10-CM | POA: Diagnosis not present

## 2016-09-29 DIAGNOSIS — I495 Sick sinus syndrome: Secondary | ICD-10-CM | POA: Diagnosis not present

## 2016-09-29 DIAGNOSIS — I10 Essential (primary) hypertension: Secondary | ICD-10-CM | POA: Diagnosis not present

## 2016-10-01 ENCOUNTER — Encounter: Payer: Self-pay | Admitting: Cardiology

## 2016-10-01 ENCOUNTER — Ambulatory Visit (HOSPITAL_COMMUNITY): Payer: Medicare Other

## 2016-10-01 ENCOUNTER — Encounter (HOSPITAL_COMMUNITY): Payer: Self-pay

## 2016-10-04 DIAGNOSIS — E785 Hyperlipidemia, unspecified: Secondary | ICD-10-CM | POA: Diagnosis not present

## 2016-10-04 DIAGNOSIS — I480 Paroxysmal atrial fibrillation: Secondary | ICD-10-CM | POA: Diagnosis not present

## 2016-10-04 DIAGNOSIS — I495 Sick sinus syndrome: Secondary | ICD-10-CM | POA: Diagnosis not present

## 2016-10-04 DIAGNOSIS — J189 Pneumonia, unspecified organism: Secondary | ICD-10-CM | POA: Diagnosis not present

## 2016-10-04 DIAGNOSIS — I10 Essential (primary) hypertension: Secondary | ICD-10-CM | POA: Diagnosis not present

## 2016-10-04 DIAGNOSIS — K219 Gastro-esophageal reflux disease without esophagitis: Secondary | ICD-10-CM | POA: Diagnosis not present

## 2016-10-05 ENCOUNTER — Ambulatory Visit (INDEPENDENT_AMBULATORY_CARE_PROVIDER_SITE_OTHER): Payer: Medicare Other | Admitting: Internal Medicine

## 2016-10-05 ENCOUNTER — Encounter: Payer: Self-pay | Admitting: Internal Medicine

## 2016-10-05 VITALS — BP 106/64 | HR 88 | Ht 67.0 in | Wt 163.2 lb

## 2016-10-05 DIAGNOSIS — I495 Sick sinus syndrome: Secondary | ICD-10-CM

## 2016-10-05 DIAGNOSIS — I480 Paroxysmal atrial fibrillation: Secondary | ICD-10-CM

## 2016-10-05 NOTE — Patient Instructions (Signed)
Medication Instructions:  Your physician recommends that you continue on your current medications as directed. Please refer to the Current Medication list given to you today.  Labwork: None ordered.  Testing/Procedures: None ordered.  Follow-Up: Your physician wants you to follow-up in: one year with Dr.Taylor.   You will receive a reminder letter in the mail two months in advance. If you don't receive a letter, please call our office to schedule the follow-up appointment.  Remote monitoring is used to monitor your Pacemaker from home. This monitoring reduces the number of office visits required to check your device to one time per year. It allows us to keep an eye on the functioning of your device to ensure it is working properly. You are scheduled for a device check from home on 12/27/2016. You may send your transmission at any time that day. If you have a wireless device, the transmission will be sent automatically. After your physician reviews your transmission, you will receive a postcard with your next transmission date.    Any Other Special Instructions Will Be Listed Below (If Applicable).     If you need a refill on your cardiac medications before your next appointment, please call your pharmacy.   

## 2016-10-05 NOTE — Progress Notes (Signed)
HPI Molly Taylor returns today for followup. She is a pleasant 83 yo woman with symptomatic bradycardia, s/p PPM generator change out three years ago. Since then, she has been stable.  She denies chest pain or syncope. Her peripheral edema had worsened and she was found to be in atrial fib and I placed her on amiodarone and set her up for a DCCV. In the interim, she has had pneumonia and is just starting to feel better. No palpitations.  Allergies  Allergen Reactions  . Hydrocodone     Excessive vomiting  . Sulfa Antibiotics Swelling  . Codeine Nausea And Vomiting     Current Outpatient Prescriptions  Medication Sig Dispense Refill  . amiodarone (PACERONE) 200 MG tablet Take 1 tablet (200 mg total) by mouth daily. 90 tablet 3  . apixaban (ELIQUIS) 5 MG TABS tablet Take 1 tablet (5 mg total) by mouth 2 (two) times daily. These are correct directions. Last rx said 1 po daily. Cannot refill till 11/15/2015 60 tablet 11  . CARTIA XT 240 MG 24 hr capsule Take 1 capsule (240 mg total) by mouth daily. 90 capsule 3  . loratadine (CLARITIN) 10 MG tablet Take 10 mg by mouth daily as needed for allergies.    . metoprolol succinate (TOPROL-XL) 25 MG 24 hr tablet Take 1 tablet (25 mg total) by mouth daily. 90 tablet 3  . pantoprazole (PROTONIX) 40 MG tablet Take 1 tablet (40 mg total) by mouth 2 (two) times daily. 60 tablet 0  . polyethylene glycol (MIRALAX / GLYCOLAX) packet Take 17 g by mouth daily. 14 each 0   No current facility-administered medications for this visit.      Past Medical History:  Diagnosis Date  . Allergic rhinitis, cause unspecified   . Chronic anticoagulation   . Family history of adverse reaction to anesthesia    all children - PONV  . Generalized osteoarthrosis, unspecified site   . GERD (gastroesophageal reflux disease)    pt. denies  . Hemorrhage of rectum and anus   . History of bronchitis   . HTN (hypertension)   . Hypercholesteremia    pt. denies  .  Hypertonicity of bladder   . PAF (paroxysmal atrial fibrillation) (HCC) paf   CHADS2VASC score is 4  . Pancreatitis, gallstone   . PONV (postoperative nausea and vomiting)   . Presence of permanent cardiac pacemaker   . Renal cell cancer ([place])    pt. denies  . Tachy-brady syndrome (HCC)    s/p PPM  . Urinary incontinence   . Wears glasses    reading  . Wears partial dentures    upper partial    ROS:   All systems reviewed and negative except as noted in the HPI.   Past Surgical History:  Procedure Laterality Date  . ABDOMINAL HYSTERECTOMY    . COLONOSCOPY    . LAPAROSCOPIC CHOLECYSTECTOMY  2007   lap choli  . left knee replacement - Dr. French Ana  2012   lt total knee  . PACEMAKER GENERATOR CHANGE  04/2013   MDT ADDRL1 pacemaker implanted by Dr Lovena Le  . PACEMAKER INSERTION  2007  . PERMANENT PACEMAKER GENERATOR CHANGE N/A 05/03/2013   Procedure: PERMANENT PACEMAKER GENERATOR CHANGE;  Surgeon: Evans Lance, MD;  Location: [facility] CATH LAB;  Service: Cardiovascular;  Laterality: N/A;  . PROXIMAL INTERPHALANGEAL FUSION (PIP) Right 08/22/2013   Procedure: EXCISION MUCOID CYST DEBRIDEMENT PROXIMAL INTERPHALANGEAL JOINT;  Surgeon: Wynonia Sours, MD;  Location:  [place];  Service: Orthopedics;  Laterality: Right;  . THUMB ARTHROSCOPY  2007   right  . TOTAL ABDOMINAL HYSTERECTOMY W/ BILATERAL SALPINGOOPHORECTOMY    . TOTAL KNEE ARTHROPLASTY Right 10/03/2015   Procedure: RIGHT TOTAL KNEE ARTHROPLASTY;  Surgeon: Earlie Server, MD;  Location: [place];  Service: Orthopedics;  Laterality: Right;  . TUBAL LIGATION    . VEIN LIGATION       Family History  Problem Relation Age of Onset  . Hypertension Other      Social History   Social History  . Marital status: Married    Spouse name: N/A  . Number of children: N/A  . Years of education: N/A   Occupational History  . Not on file.   Social History Main Topics  . Smoking status: Never Smoker  . Smokeless  tobacco: Never Used  . Alcohol use No  . Drug use: No  . Sexual activity: Not on file   Other Topics Concern  . Not on file   Social History Narrative  . No narrative on file     BP 106/64   Pulse 88   Ht 5\' 7"  (1.702 m)   Wt 163 lb 3.2 oz (74 kg)   SpO2 95%   BMI 25.56 kg/m   Physical Exam:  stable appearing elderly woman, looking younger than her stated age, NAD HEENT: Unremarkable Neck:   7 cm JVD, no thyromegall Back:  No CVA tenderness Lungs:  no wheezes although basilar rales are present HEART:  Regular rate rhythm, no murmurs, no rubs, no clicks Abd:  soft, positive bowel sounds, no organomegally, no rebound, no guarding Ext:  2 plus pulses, trace peripheral edema, no cyanosis, no clubbing Skin:  No rashes no nodules Neuro:  CN II through XII intact, motor grossly intact   DEVICE  Normal device function.  See PaceArt for details. Underlying rhythm is NSR  Assess/Plan: 1. Recurrent atrial fib - she has returned to NSR on amio. She will continue this medication at 200 mg daily.  2. Coags -she will continue eliquis 3. CHB - she is stable, s/p PPM insertion.  4. Chronic diastolic heart failure - her symptoms have stablized. She is thought to be euvolemic 5. PM - her [employer] DDD PM is working normally. Will recheck in several months.   Mikle Bosworth.D.

## 2016-10-07 DIAGNOSIS — E785 Hyperlipidemia, unspecified: Secondary | ICD-10-CM | POA: Diagnosis not present

## 2016-10-07 DIAGNOSIS — I480 Paroxysmal atrial fibrillation: Secondary | ICD-10-CM | POA: Diagnosis not present

## 2016-10-07 DIAGNOSIS — I495 Sick sinus syndrome: Secondary | ICD-10-CM | POA: Diagnosis not present

## 2016-10-07 DIAGNOSIS — I10 Essential (primary) hypertension: Secondary | ICD-10-CM | POA: Diagnosis not present

## 2016-10-07 DIAGNOSIS — J189 Pneumonia, unspecified organism: Secondary | ICD-10-CM | POA: Diagnosis not present

## 2016-10-07 DIAGNOSIS — K219 Gastro-esophageal reflux disease without esophagitis: Secondary | ICD-10-CM | POA: Diagnosis not present

## 2016-10-08 DIAGNOSIS — J189 Pneumonia, unspecified organism: Secondary | ICD-10-CM | POA: Diagnosis not present

## 2016-10-11 DIAGNOSIS — I495 Sick sinus syndrome: Secondary | ICD-10-CM | POA: Diagnosis not present

## 2016-10-11 DIAGNOSIS — I10 Essential (primary) hypertension: Secondary | ICD-10-CM | POA: Diagnosis not present

## 2016-10-11 DIAGNOSIS — K219 Gastro-esophageal reflux disease without esophagitis: Secondary | ICD-10-CM | POA: Diagnosis not present

## 2016-10-11 DIAGNOSIS — I480 Paroxysmal atrial fibrillation: Secondary | ICD-10-CM | POA: Diagnosis not present

## 2016-10-11 DIAGNOSIS — J189 Pneumonia, unspecified organism: Secondary | ICD-10-CM | POA: Diagnosis not present

## 2016-10-11 DIAGNOSIS — E785 Hyperlipidemia, unspecified: Secondary | ICD-10-CM | POA: Diagnosis not present

## 2016-10-13 DIAGNOSIS — K219 Gastro-esophageal reflux disease without esophagitis: Secondary | ICD-10-CM | POA: Diagnosis not present

## 2016-10-13 DIAGNOSIS — J189 Pneumonia, unspecified organism: Secondary | ICD-10-CM | POA: Diagnosis not present

## 2016-10-13 DIAGNOSIS — I480 Paroxysmal atrial fibrillation: Secondary | ICD-10-CM | POA: Diagnosis not present

## 2016-10-13 DIAGNOSIS — I10 Essential (primary) hypertension: Secondary | ICD-10-CM | POA: Diagnosis not present

## 2016-10-13 DIAGNOSIS — I495 Sick sinus syndrome: Secondary | ICD-10-CM | POA: Diagnosis not present

## 2016-10-13 DIAGNOSIS — E785 Hyperlipidemia, unspecified: Secondary | ICD-10-CM | POA: Diagnosis not present

## 2016-10-15 ENCOUNTER — Encounter: Payer: Self-pay | Admitting: Emergency Medicine

## 2016-10-15 ENCOUNTER — Ambulatory Visit (INDEPENDENT_AMBULATORY_CARE_PROVIDER_SITE_OTHER): Payer: Medicare Other | Admitting: Emergency Medicine

## 2016-10-15 VITALS — BP 114/72 | HR 80 | Ht 65.0 in | Wt 163.0 lb

## 2016-10-15 DIAGNOSIS — J189 Pneumonia, unspecified organism: Secondary | ICD-10-CM | POA: Diagnosis not present

## 2016-10-15 DIAGNOSIS — R918 Other nonspecific abnormal finding of lung field: Secondary | ICD-10-CM

## 2016-10-15 NOTE — Progress Notes (Signed)
Subjective:    Patient ID: Molly Taylor, female    DOB: 1926/06/01, 83 y.o.   MRN: [id number]  HPI 83 year old never smoker with a history of atrial fibrillation and tachybradycardia syndrome, symptomatic bradycardia with pacemaker in place. She is on anticoagulation and amiodarone. Late June she had progression of deep cough, usually at night. She was hospitalized from 7/1-09/24/16 with a multi lobar pneumonia suspected related to gastroesophageal reflux and aspiration. A CT scan of her chest on 09/20/16 was reviewed by me. This shows bilateral lower lobe patchy airspace consolidation and some nonspecific areas of bilateral upper lobe groundglass. Her dyspnea is better. Her nocturnal cough is better with swallowing modification, head of bed up.    Review of Systems  Constitutional: Positive for appetite change. Negative for fever and unexpected weight change.  HENT: Positive for sneezing. Negative for congestion, dental problem, ear pain, nosebleeds, postnasal drip, rhinorrhea, sinus pressure, sore throat and trouble swallowing.   Eyes: Negative for redness and itching.  Respiratory: Positive for cough and shortness of breath. Negative for chest tightness and wheezing.   Cardiovascular: Negative for palpitations and leg swelling.  Gastrointestinal: Negative for nausea and vomiting.  Genitourinary: Negative for dysuria.  Musculoskeletal: Negative for joint swelling.  Skin: Negative for rash.  Neurological: Negative for headaches.  Hematological: Does not bruise/bleed easily.  Psychiatric/Behavioral: Negative for dysphoric mood. The patient is not nervous/anxious.     Past Medical History:  Diagnosis Date  . Allergic rhinitis, cause unspecified   . Chronic anticoagulation   . Family history of adverse reaction to anesthesia    all children - PONV  . Generalized osteoarthrosis, unspecified site   . GERD (gastroesophageal reflux disease)    pt. denies  . Hemorrhage of rectum and anus    . History of bronchitis   . HTN (hypertension)   . Hypercholesteremia    pt. denies  . Hypertonicity of bladder   . PAF (paroxysmal atrial fibrillation) (HCC) paf   CHADS2VASC score is 4  . Pancreatitis, gallstone   . PONV (postoperative nausea and vomiting)   . Presence of permanent cardiac pacemaker   . Renal cell cancer ([place])    pt. denies  . Tachy-brady syndrome (HCC)    s/p PPM  . Urinary incontinence   . Wears glasses    reading  . Wears partial dentures    upper partial     Family History  Problem Relation Age of Onset  . Hypertension Other      Social History   Social History  . Marital status: Married    Spouse name: N/A  . Number of children: N/A  . Years of education: N/A   Occupational History  . Not on file.   Social History Main Topics  . Smoking status: Never Smoker  . Smokeless tobacco: Never Used  . Alcohol use No  . Drug use: No  . Sexual activity: Not on file   Other Topics Concern  . Not on file   Social History Narrative  . No narrative on file     Allergies  Allergen Reactions  . Hydrocodone     Excessive vomiting  . Sulfa Antibiotics Swelling  . Codeine Nausea And Vomiting     Outpatient Medications Prior to Visit  Medication Sig Dispense Refill  . amiodarone (PACERONE) 200 MG tablet Take 1 tablet (200 mg total) by mouth daily. 90 tablet 3  . apixaban (ELIQUIS) 5 MG TABS tablet Take 1 tablet (5 mg total) by  mouth 2 (two) times daily. These are correct directions. Last rx said 1 po daily. Cannot refill till 11/15/2015 60 tablet 11  . CARTIA XT 240 MG 24 hr capsule Take 1 capsule (240 mg total) by mouth daily. 90 capsule 3  . loratadine (CLARITIN) 10 MG tablet Take 10 mg by mouth daily as needed for allergies.    . metoprolol succinate (TOPROL-XL) 25 MG 24 hr tablet Take 1 tablet (25 mg total) by mouth daily. 90 tablet 3  . pantoprazole (PROTONIX) 40 MG tablet Take 1 tablet (40 mg total) by mouth 2 (two) times daily. 60 tablet 0   . polyethylene glycol (MIRALAX / GLYCOLAX) packet Take 17 g by mouth daily. 14 each 0   No facility-administered medications prior to visit.         Objective:   Physical Exam Vitals:   10/15/16 1421  BP: 114/72  Pulse: 80  SpO2: 100%  Weight: 163 lb (73.9 kg)  Height: 5\' 5"  (1.651 m)   Gen: Pleasant, Thin elderly woman, in no distress,  normal affect  ENT: No lesions,  mouth clear,  oropharynx clear, no postnasal drip  Neck: No JVD, no TMG, no carotid bruits  Lungs: No use of accessory muscles,Bibasilar inspiratory crackles more so on the right than on the left  Cardiovascular: RRR, heart sounds normal, no murmur or gallops, trace bilateral ankle edema  Musculoskeletal: No deformities, no cyanosis or clubbing  Neuro: alert, non focal  Skin: Warm, no lesions or rashes  09/20/16 --  COMPARISON:  Chest radiograph 09/19/2016  FINDINGS: Cardiovascular: Satisfactory opacification of the pulmonary arteries to the segmental level. No evidence of pulmonary embolism. Enlarged heart. No pericardial effusion. Calcific atherosclerotic disease of the aorta and coronary arteries. Mild dilation of the ascending aorta measuring 4.2 cm in greatest dimension.  Mediastinum/Nodes: No enlarged mediastinal, hilar, or axillary lymph nodes. Thyroid gland, trachea, and esophagus demonstrate no significant findings.  Lungs/Pleura: Multifocal patchy airspace consolidation in lingula and bilateral lower lobes. Additional areas of ground-glass consolidation in bilateral upper lobes.  Upper Abdomen: No acute abnormality.  Musculoskeletal: No chest wall abnormality. No acute or significant osseous findings. Cardiac pacemaker within the left chest wall with intact leads.  Review of the MIP images confirms the above findings.  IMPRESSION: Bilateral lower lobe patchy airspace consolidation which may represent multilobar pneumonia. Aspiration pneumonia is also on the differential  diagnosis.  Additional nonspecific areas of ground-glass opacity in bilateral upper lobes. In the acute clinical settings, these may represent infectious or inflammatory consolidation. However follow-up is recommended after resolution of the acute symptoms.  No evidence of pulmonary embolus.  Mild dilation of the ascending aorta measuring 4.2 cm. Recommend annual imaging followup by CTA or MRA      Assessment & Plan:  CAP (community acquired pneumonia) With bilateral inferior infiltrates as well as some upper lobe groundglass change. The due to gastroesophageal reflux and intermittent aspiration. He was having cough that was worst at night when supine. She is now practicing swallowing precautions, has head of her bed elevated, aspiration precautions. It certainly possible that her bilateral infiltrates were solely related to the pneumonia. Must also consider other contributors. She is on amiodarone. I would like to repeat her CT scan of the chest in approximately 2 weeks. If infiltrates persist then consider possible scar from chronic aspiration, consider possible impact of amiodarone. Walking oximetry today. If she continues to be dyspneic then we would consider pulmonary function testing, although I suspect that any obstruction  that she experienced was only in the setting of the acute illness.  We will repeat your your CT scan of the chest without contrast to compare with your prior. We will do this in mid August.  We will perform a walking oximetry on room air to see if you would still benefit from oxygen.  You may stay off the inhaled medications at this time.  Please continue to practice your swallowing and aspiration precautions as you have been doing.  You have benefited from physical therapy. You can probably stop this now based on the therapist's recommendations.  Follow with Dr Lamonte Sakai next available to discuss your testing.   Baltazar Apo, MD, PhD 10/15/2016, 3:08 PM [employer]  Pulmonary and Critical Care [phone] or if no answer [phone]

## 2016-10-15 NOTE — Patient Instructions (Signed)
We will repeat your your CT scan of the chest without contrast to compare with your prior. We will do this in mid August.  We will perform a walking oximetry on room air to see if you would still benefit from oxygen.  You may stay off the inhaled medications at this time.  Please continue to practice your swallowing and aspiration precautions as you have been doing.  You have benefited from physical therapy. You can probably stop this now based on the therapist's recommendations.  Follow with Dr Lamonte Sakai next available to discuss your testing.

## 2016-10-15 NOTE — Assessment & Plan Note (Signed)
With bilateral inferior infiltrates as well as some upper lobe groundglass change. The due to gastroesophageal reflux and intermittent aspiration. He was having cough that was worst at night when supine. She is now practicing swallowing precautions, has head of her bed elevated, aspiration precautions. It certainly possible that her bilateral infiltrates were solely related to the pneumonia. Must also consider other contributors. She is on amiodarone. I would like to repeat her CT scan of the chest in approximately 2 weeks. If infiltrates persist then consider possible scar from chronic aspiration, consider possible impact of amiodarone. Walking oximetry today. If she continues to be dyspneic then we would consider pulmonary function testing, although I suspect that any obstruction that she experienced was only in the setting of the acute illness.  We will repeat your your CT scan of the chest without contrast to compare with your prior. We will do this in mid August.  We will perform a walking oximetry on room air to see if you would still benefit from oxygen.  You may stay off the inhaled medications at this time.  Please continue to practice your swallowing and aspiration precautions as you have been doing.  You have benefited from physical therapy. You can probably stop this now based on the therapist's recommendations.  Follow with Dr Lamonte Sakai next available to discuss your testing.

## 2016-10-18 DIAGNOSIS — E785 Hyperlipidemia, unspecified: Secondary | ICD-10-CM | POA: Diagnosis not present

## 2016-10-18 DIAGNOSIS — K219 Gastro-esophageal reflux disease without esophagitis: Secondary | ICD-10-CM | POA: Diagnosis not present

## 2016-10-18 DIAGNOSIS — I495 Sick sinus syndrome: Secondary | ICD-10-CM | POA: Diagnosis not present

## 2016-10-18 DIAGNOSIS — J189 Pneumonia, unspecified organism: Secondary | ICD-10-CM | POA: Diagnosis not present

## 2016-10-18 DIAGNOSIS — I10 Essential (primary) hypertension: Secondary | ICD-10-CM | POA: Diagnosis not present

## 2016-10-18 DIAGNOSIS — I480 Paroxysmal atrial fibrillation: Secondary | ICD-10-CM | POA: Diagnosis not present

## 2016-10-21 DIAGNOSIS — I10 Essential (primary) hypertension: Secondary | ICD-10-CM | POA: Diagnosis not present

## 2016-10-21 DIAGNOSIS — I495 Sick sinus syndrome: Secondary | ICD-10-CM | POA: Diagnosis not present

## 2016-10-21 DIAGNOSIS — E785 Hyperlipidemia, unspecified: Secondary | ICD-10-CM | POA: Diagnosis not present

## 2016-10-21 DIAGNOSIS — K219 Gastro-esophageal reflux disease without esophagitis: Secondary | ICD-10-CM | POA: Diagnosis not present

## 2016-10-21 DIAGNOSIS — I480 Paroxysmal atrial fibrillation: Secondary | ICD-10-CM | POA: Diagnosis not present

## 2016-10-21 DIAGNOSIS — J189 Pneumonia, unspecified organism: Secondary | ICD-10-CM | POA: Diagnosis not present

## 2016-10-22 ENCOUNTER — Ambulatory Visit (INDEPENDENT_AMBULATORY_CARE_PROVIDER_SITE_OTHER)
Admission: RE | Admit: 2016-10-22 | Discharge: 2016-10-22 | Disposition: A | Discharge status: Home / Self Care | Payer: Medicare Other | Source: Ambulatory Visit | Attending: Emergency Medicine | Admitting: Emergency Medicine

## 2016-10-22 ENCOUNTER — Other Ambulatory Visit: Payer: Self-pay | Admitting: Internal Medicine

## 2016-10-22 DIAGNOSIS — R918 Other nonspecific abnormal finding of lung field: Secondary | ICD-10-CM | POA: Diagnosis not present

## 2016-10-22 NOTE — Telephone Encounter (Signed)
Eliquis 5mg  received; pt is 83 yrs old, wt-73.9kg, Crea-0.69 on 09/22/16, last seen by Dr. Lovena Le on 10/05/16. Will send in refill request to requested Pharmacy.

## 2016-10-26 ENCOUNTER — Telehealth: Payer: Self-pay

## 2016-10-26 NOTE — Telephone Encounter (Signed)
**Note De-Identified Rhilynn Preyer Obfuscation** The pt states that she cannot afford her Eliquis and is requesting samples.  I have given her 3 boxes of Eliquis samples and an application for pt assistance with [employer]. She states that she will complete application, obtain needed documents and return them to this office so I can fax in along with the provider portion of application.

## 2016-10-28 NOTE — Telephone Encounter (Signed)
Patient walk in with [employer] patient assistance forms completed for [facility]. Have placed provider forms and script in mailbox for Dr Lovena Le to sign.

## 2016-11-01 ENCOUNTER — Ambulatory Visit (INDEPENDENT_AMBULATORY_CARE_PROVIDER_SITE_OTHER): Payer: Medicare Other | Admitting: Acute Care

## 2016-11-01 ENCOUNTER — Encounter: Payer: Self-pay | Admitting: Acute Care

## 2016-11-01 VITALS — BP 120/70 | HR 95 | Ht 65.0 in | Wt 162.8 lb

## 2016-11-01 DIAGNOSIS — I729 Aneurysm of unspecified site: Secondary | ICD-10-CM | POA: Diagnosis not present

## 2016-11-01 DIAGNOSIS — I712 Thoracic aortic aneurysm, without rupture, unspecified: Secondary | ICD-10-CM | POA: Insufficient documentation

## 2016-11-01 DIAGNOSIS — J189 Pneumonia, unspecified organism: Secondary | ICD-10-CM

## 2016-11-01 NOTE — Patient Instructions (Addendum)
It is nice to meet you today. We will refer you to Dr. Roxan Hockey for folow up of the aneurysm noted on your CT.  Continue using your swallow techniques to prevent aspiration Mucinex 1200 mg once daily with a full glass of water for chest congestion. Follow up with Dr. Lamonte Sakai in 3 months. Please contact office for sooner follow up if symptoms do not improve or worsen or seek emergency care  Consider CT/ CXR  at repeat if still has infiltrates Consider stopping Amiodarine

## 2016-11-01 NOTE — Assessment & Plan Note (Addendum)
Continued improvement per CT Plan: Continue using your swallow techniques to prevent aspiration Mucinex 1200 mg once daily with a full glass of water for chest congestion. Follow up with Dr. Lamonte Sakai in 3 months. Please contact office for sooner follow up if symptoms do not improve or worsen or seek emergency care  Continue to monitor Consider follow up imaging.

## 2016-11-01 NOTE — Assessment & Plan Note (Addendum)
Noted per CT Chest 09/2015 and per all subsequent CT's Plan: We will refer you to Dr. Roxan Hockey for follow up of the aneurysm noted on your CT.  Follow up with Dr. Lamonte Sakai in 3 months. Please contact office for sooner follow up if symptoms do not improve or worsen or seek emergency care

## 2016-11-01 NOTE — Progress Notes (Signed)
History of Present Illness Molly Taylor is a 83 y.o. female never smoker seen by Dr. Lamonte Sakai for worsening cough at night.   HPI 83 year old never smoker with a history of atrial fibrillation and tachybradycardia syndrome, symptomatic bradycardia with pacemaker in place. She is on anticoagulation and amiodarone. Late June she had progression of deep cough, usually at night. She was hospitalized from 7/1-09/24/16 with a multi lobar pneumonia suspected related to gastroesophageal reflux and aspiration. A CT scan of her chest on 09/20/16 was reviewed by me. This shows bilateral lower lobe patchy airspace consolidation and some nonspecific areas of bilateral upper lobe groundglass. Her dyspnea is better. Her nocturnal cough is better with swallowing modification, head of bed up.    11/01/2016 Follow up for Infiltrates per CT: Pt. Presents for follow up. She states she is doing well. She states she has a rare cough in the morning. She states she has less dyspnea. She was weaned off her oxygen in July, and continues to do well without it. She denies any fever, chest pain, orthopnea or hemoptysis. She is using her swallowing and aspiration precautions as she was taught to do.  Test Results: CT Chest without contrast 10/22/2016 IMPRESSION: Improving bilateral lower lobe and lingular infiltrates/pneumonia. Mild residual infiltrates remain. Peripheral ground-glass opacities and interstitial thickening in the upper and mid lung zones may reflect chronic lung disease/ fibrosis.  Cardiomegaly, coronary artery disease.  Aortic atherosclerosis.   Recommend semi-annual imaging followup by CTA or MRA and referral to cardiothoracic surgery if not already obtained.   CBC Latest Ref Rng & Units 09/23/2016 09/20/2016 09/19/2016  WBC 4.0 - 10.5 K/uL 7.3 6.6 6.7  Hemoglobin 12.0 - 15.0 g/dL 11.9(L) 12.6 13.6  Hematocrit 36.0 - 46.0 % 33.7(L) 35.7(L) 38.1  Platelets 150 - 400 K/uL 181 180 190    BMP Latest Ref  Rng & Units 09/22/2016 09/20/2016 09/19/2016  Glucose 65 - 99 mg/dL 137(H) 113(H) 113(H)  BUN 6 - 20 mg/dL 11 16 16   Creatinine 0.44 - 1.00 mg/dL 0.69 0.81 0.89  Sodium 135 - 145 mmol/L 138 139 139  Potassium 3.5 - 5.1 mmol/L 3.9 3.4(L) 3.6  Chloride 101 - 111 mmol/L 109 107 109  CO2 22 - 32 mmol/L 21(L) 24 22  Calcium 8.9 - 10.3 mg/dL 9.0 9.2 9.4    BNP    Component Value Date/Time   BNP 124.1 (H) 09/19/2016 2218    ProBNP    Component Value Date/Time   PROBNP 974.0 (H) 04/16/2013 1529    PFT    Component Value Date/Time   FEV1PRE 2.00 01/20/2016 1559   FEV1POST 2.15 01/20/2016 1559   FVCPRE 2.58 01/20/2016 1559   FVCPOST 2.62 01/20/2016 1559   TLC 4.64 01/20/2016 1559   DLCOUNC 13.15 01/20/2016 1559   PREFEV1FVCRT 77 01/20/2016 1559   PSTFEV1FVCRT 82 01/20/2016 1559    Ct Chest Wo Contrast  Result Date: 10/22/2016 CLINICAL DATA:  Follow-up bilateral infiltrates EXAM: CT CHEST WITHOUT CONTRAST TECHNIQUE: Multidetector CT imaging of the chest was performed following the standard protocol without IV contrast. COMPARISON:  09/20/2016.  Chest x-ray 09/19/2016 FINDINGS: Cardiovascular: Cardiomegaly. Pacer wires in the right atrium and right ventricle. Mild dilatation of the ascending thoracic aorta, 4.5 cm. Aortic calcifications and coronary artery calcifications. Mediastinum/Nodes: No mediastinal, hilar, or axillary adenopathy. Lungs/Pleura: Scarring in the apices. Improving but persistent airspace opacities in both lower lobes and lingula. Patchy peripheral ground-glass densities and interstitial thickening in the mid and upper lung zones. No effusions.  Upper Abdomen: Imaging into the upper abdomen shows no acute findings. Musculoskeletal: Chest wall soft tissues are unremarkable. No acute bony abnormality. IMPRESSION: Improving bilateral lower lobe and lingular infiltrates/pneumonia. Mild residual infiltrates remain. Peripheral ground-glass opacities and interstitial thickening in the  upper and mid lung zones may reflect chronic lung disease/ fibrosis. Cardiomegaly, coronary artery disease.  Aortic atherosclerosis. 4.5 cm ascending thoracic aortic aneurysm. Recommend semi-annual imaging followup by CTA or MRA and referral to cardiothoracic surgery if not already obtained. This recommendation follows 2010 ACCF/AHA/AATS/ACR/ASA/SCA/SCAI/SIR/STS/SVM Guidelines for the Diagnosis and Management of Patients With Thoracic Aortic Disease. Circulation. 2010; 121: D322-G254 Electronically Signed   By: Rolm Baptise M.D.   On: 10/22/2016 11:39     Past medical hx Past Medical History:  Diagnosis Date  . Allergic rhinitis, cause unspecified   . Chronic anticoagulation   . Family history of adverse reaction to anesthesia    all children - PONV  . Generalized osteoarthrosis, unspecified site   . GERD (gastroesophageal reflux disease)    pt. denies  . Hemorrhage of rectum and anus   . History of bronchitis   . HTN (hypertension)   . Hypercholesteremia    pt. denies  . Hypertonicity of bladder   . PAF (paroxysmal atrial fibrillation) (HCC) paf   CHADS2VASC score is 4  . Pancreatitis, gallstone   . PONV (postoperative nausea and vomiting)   . Presence of permanent cardiac pacemaker   . Renal cell cancer ([place])    pt. denies  . Tachy-brady syndrome (HCC)    s/p PPM  . Urinary incontinence   . Wears glasses    reading  . Wears partial dentures    upper partial     Social History  Substance Use Topics  . Smoking status: Never Smoker  . Smokeless tobacco: Never Used  . Alcohol use No    [name] reports that she has never smoked. She has never used smokeless tobacco. She reports that she does not drink alcohol or use drugs.  Tobacco Cessation: Never smoker  Past surgical hx, Family hx, Social hx all reviewed.  Current Outpatient Prescriptions on File Prior to Visit  Medication Sig  . amiodarone (PACERONE) 200 MG tablet Take 1 tablet (200 mg total) by mouth daily.  Marland Kitchen  CARTIA XT 240 MG 24 hr capsule Take 1 capsule (240 mg total) by mouth daily.  Marland Kitchen ELIQUIS 5 MG TABS tablet TAKE ONE TABLET BY MOUTH TWICE DAILY  . loratadine (CLARITIN) 10 MG tablet Take 10 mg by mouth daily as needed for allergies.  . metoprolol succinate (TOPROL-XL) 25 MG 24 hr tablet Take 1 tablet (25 mg total) by mouth daily.  . pantoprazole (PROTONIX) 40 MG tablet Take 1 tablet (40 mg total) by mouth 2 (two) times daily.  . polyethylene glycol (MIRALAX / GLYCOLAX) packet Take 17 g by mouth daily.   No current facility-administered medications on file prior to visit.      Allergies  Allergen Reactions  . Hydrocodone     Excessive vomiting  . Sulfa Antibiotics Swelling  . Codeine Nausea And Vomiting    Review Of Systems:  Constitutional:   No  weight loss, night sweats,  Fevers, chills, fatigue, or  lassitude.  HEENT:   No headaches,  Difficulty swallowing,  Tooth/dental problems, or  Sore throat,                No sneezing, itching, ear ache, nasal congestion, post nasal drip,   CV:  No chest pain,  Orthopnea, PND, swelling in lower extremities, anasarca, dizziness, palpitations, syncope.   GI  No heartburn, indigestion, abdominal pain, nausea, vomiting, diarrhea, change in bowel habits, loss of appetite, bloody stools.   Resp: No shortness of breath with exertion or at rest.  No excess mucus, no productive cough,  No non-productive cough,  No coughing up of blood.  No change in color of mucus.  No wheezing.  No chest wall deformity  Skin: no rash or lesions.  GU: no dysuria, change in color of urine, no urgency or frequency.  No flank pain, no hematuria   MS:  No joint pain or swelling.  No decreased range of motion.  No back pain.  Psych:  No change in mood or affect. No depression or anxiety.  No memory loss.   Vital Signs BP 120/70 (BP Location: Left Arm, Cuff Size: Normal)   Pulse 95   Ht 5\' 5"  (1.651 m)   Wt 162 lb 12.8 oz (73.8 kg)   SpO2 96%   BMI 27.09 kg/m      Physical Exam:  General- No distress,  A&Ox3, pleasant ENT: No sinus tenderness, TM clear, pale nasal mucosa, no oral exudate,no post nasal drip, no LAN Cardiac: S1, S2, regular rate and rhythm, no murmur Chest: No wheeze/ rales/ dullness; no accessory muscle use, no nasal flaring, no sternal retractions Abd.: Soft Non-tender Ext: No clubbing cyanosis, edema Neuro:  normal strength, deconditioned at baseline Skin: No rashes, warm and dry Psych: normal mood and behavior   Assessment/Plan  CAP (community acquired pneumonia) Continued improvement per CT Plan: Continue using your swallow techniques to prevent aspiration Mucinex 1200 mg once daily with a full glass of water for chest congestion. Follow up with Dr. Lamonte Sakai in 3 months. Please contact office for sooner follow up if symptoms do not improve or worsen or seek emergency care  Continue to monitor Consider follow up imaging.   Thoracic aortic aneurysm [facility]) Noted per CT Chest 09/2015 and per all subsequent CT's Plan: We will refer you to Dr. Roxan Hockey for follow up of the aneurysm noted on your CT.  Follow up with Dr. Lamonte Sakai in 3 months. Please contact office for sooner follow up if symptoms do not improve or worsen or seek emergency care      Magdalen Spatz, NP 11/01/2016  5:43 PM

## 2016-11-09 DIAGNOSIS — H00014 Hordeolum externum left upper eyelid: Secondary | ICD-10-CM | POA: Diagnosis not present

## 2016-11-09 NOTE — Telephone Encounter (Signed)
**Note De-Identified Bradford Cazier Obfuscation** Dr Lovena Le has signed and I have faxed the pts application to [employer]. Awaiting response.

## 2016-11-16 DIAGNOSIS — H00014 Hordeolum externum left upper eyelid: Secondary | ICD-10-CM | POA: Diagnosis not present

## 2016-11-16 NOTE — Telephone Encounter (Signed)
The pt has been approved through [employer] for [employer] for her Eliquis. Approval good from 11/15/16 until 03/21/17.

## 2016-11-19 ENCOUNTER — Telehealth: Payer: Self-pay | Admitting: *Deleted

## 2016-11-19 NOTE — Telephone Encounter (Signed)
Called patient to let her know that her eliquis had arrived from the company and that I will place it at the front desk.

## 2016-11-23 ENCOUNTER — Encounter: Payer: Self-pay | Admitting: Thoracic Surgery (Cardiothoracic Vascular Surgery)

## 2016-11-23 ENCOUNTER — Institutional Professional Consult (permissible substitution) (INDEPENDENT_AMBULATORY_CARE_PROVIDER_SITE_OTHER): Payer: Medicare Other | Admitting: Thoracic Surgery (Cardiothoracic Vascular Surgery)

## 2016-11-23 VITALS — BP 126/71 | HR 90 | Resp 16 | Ht 65.0 in | Wt 168.0 lb

## 2016-11-23 DIAGNOSIS — I712 Thoracic aortic aneurysm, without rupture, unspecified: Secondary | ICD-10-CM

## 2016-11-23 NOTE — Progress Notes (Signed)
PCP is Alroy Dust, L.Marlou Sa, MD Referring Provider is Magdalen Spatz, NP  Chief Complaint  Patient presents with  . TAA    CT CHEST 10/22/16    HPI: 83 year old woman sent for consultation regarding an ascending aneurysm  Mrs. Kutter is an 83 year old woman with a past history of hypertension, hypercholesterolemia, paroxysmal atrial fibrillation, tachybradycardia syndrome, permanent pacemaker, gallstone pancreatitis, reflux, and a recent pneumonia.  She was hospitalized with a pneumonia in early July. She had a CT of the chest which showed bilateral inferior and and some upper lobe groundglass opacities. Dr. Lamonte Sakai felt this was due to reflux and intermittent aspiration. She had a repeat CT done and there was a reported 4.5 cm ascending aneurysm.  She gets short of breath with exertion but has not been having any chest pain, pressure, or tightness. She has had a productive cough since coming down with pneumonia about 2 months ago. She does have some swelling in her legs which is chronic and unchanged recently.  Past Medical History:  Diagnosis Date  . Allergic rhinitis, cause unspecified   . Chronic anticoagulation   . Family history of adverse reaction to anesthesia    all children - PONV  . Generalized osteoarthrosis, unspecified site   . GERD (gastroesophageal reflux disease)    pt. denies  . Hemorrhage of rectum and anus   . History of bronchitis   . HTN (hypertension)   . Hypercholesteremia    pt. denies  . Hypertonicity of bladder   . PAF (paroxysmal atrial fibrillation) (HCC) paf   CHADS2VASC score is 4  . Pancreatitis, gallstone   . PONV (postoperative nausea and vomiting)   . Presence of permanent cardiac pacemaker   . Renal cell cancer ([place])    pt. denies  . Tachy-brady syndrome (HCC)    s/p PPM  . Urinary incontinence   . Wears glasses    reading  . Wears partial dentures    upper partial    Past Surgical History:  Procedure Laterality Date  . ABDOMINAL  HYSTERECTOMY    . COLONOSCOPY    . LAPAROSCOPIC CHOLECYSTECTOMY  2007   lap choli  . left knee replacement - Dr. French Ana  2012   lt total knee  . PACEMAKER GENERATOR CHANGE  04/2013   MDT ADDRL1 pacemaker implanted by Dr Lovena Le  . PACEMAKER INSERTION  2007  . PERMANENT PACEMAKER GENERATOR CHANGE N/A 05/03/2013   Procedure: PERMANENT PACEMAKER GENERATOR CHANGE;  Surgeon: Evans Lance, MD;  Location: [facility] CATH LAB;  Service: Cardiovascular;  Laterality: N/A;  . PROXIMAL INTERPHALANGEAL FUSION (PIP) Right 08/22/2013   Procedure: EXCISION MUCOID CYST DEBRIDEMENT PROXIMAL INTERPHALANGEAL JOINT;  Surgeon: Wynonia Sours, MD;  Location: [place];  Service: Orthopedics;  Laterality: Right;  . THUMB ARTHROSCOPY  2007   right  . TOTAL ABDOMINAL HYSTERECTOMY W/ BILATERAL SALPINGOOPHORECTOMY    . TOTAL KNEE ARTHROPLASTY Right 10/03/2015   Procedure: RIGHT TOTAL KNEE ARTHROPLASTY;  Surgeon: Earlie Server, MD;  Location: [place];  Service: Orthopedics;  Laterality: Right;  . TUBAL LIGATION    . VEIN LIGATION      Family History  Problem Relation Age of Onset  . Hypertension Other     Social History Social History  Substance Use Topics  . Smoking status: Never Smoker  . Smokeless tobacco: Never Used  . Alcohol use No    Current Outpatient Prescriptions  Medication Sig Dispense Refill  . amiodarone (PACERONE) 200 MG tablet Take 1 tablet (200 mg  total) by mouth daily. 90 tablet 3  . CARTIA XT 240 MG 24 hr capsule Take 1 capsule (240 mg total) by mouth daily. 90 capsule 3  . ELIQUIS 5 MG TABS tablet TAKE ONE TABLET BY MOUTH TWICE DAILY 180 tablet 3  . loratadine (CLARITIN) 10 MG tablet Take 10 mg by mouth daily as needed for allergies.    . metoprolol succinate (TOPROL-XL) 25 MG 24 hr tablet Take 1 tablet (25 mg total) by mouth daily. 90 tablet 3  . pantoprazole (PROTONIX) 40 MG tablet Take 1 tablet (40 mg total) by mouth 2 (two) times daily. 60 tablet 0  . polyethylene glycol  (MIRALAX / GLYCOLAX) packet Take 17 g by mouth daily. 14 each 0   No current facility-administered medications for this visit.     Allergies  Allergen Reactions  . Hydrocodone     Excessive vomiting  . Sulfa Antibiotics Swelling  . Codeine Nausea And Vomiting    Review of Systems  HENT: Negative for trouble swallowing and voice change.   Eyes: Negative for visual disturbance.  Respiratory: Positive for cough and shortness of breath. Negative for choking and wheezing.   Cardiovascular: Positive for leg swelling. Negative for chest pain.  Gastrointestinal: Negative for abdominal pain and blood in stool.  Genitourinary: Negative for difficulty urinating and frequency.  Musculoskeletal: Positive for arthralgias.  Neurological: Negative for syncope and weakness.       Memory loss  Hematological: Negative for adenopathy. Bruises/bleeds easily.  All other systems reviewed and are negative.   BP 126/71 (BP Location: Right Arm, Patient Position: Sitting, Cuff Size: Large)   Pulse 90   Resp 16   Ht 5\' 5"  (1.651 m)   Wt 168 lb (76.2 kg)   SpO2 93% Comment: ON RA  BMI 27.96 kg/m  Physical Exam  Constitutional: She is oriented to person, place, and time. She appears well-developed and well-nourished. No distress.  HENT:  Head: Normocephalic and atraumatic.  Mouth/Throat: No oropharyngeal exudate.  Eyes: Conjunctivae and EOM are normal. No scleral icterus.  Neck: Neck supple. No thyromegaly present.  No carotid bruits  Cardiovascular: Normal rate, regular rhythm, normal heart sounds and intact distal pulses.   No murmur heard. Pulmonary/Chest: Effort normal and breath sounds normal. No respiratory distress. She has no wheezes.  Abdominal: Soft. She exhibits no distension. There is no tenderness.  Musculoskeletal: She exhibits edema.  Lymphadenopathy:    She has no cervical adenopathy.  Neurological: She is alert and oriented to person, place, and time. No cranial nerve deficit.   Motor intact  Skin: Skin is warm and dry.  Varicosities both lower extremities  Vitals reviewed.    Diagnostic Tests: CT CHEST WITHOUT CONTRAST  TECHNIQUE: Multidetector CT imaging of the chest was performed following the standard protocol without IV contrast.  COMPARISON:  09/20/2016.  Chest x-ray 09/19/2016  FINDINGS: Cardiovascular: Cardiomegaly. Pacer wires in the right atrium and right ventricle. Mild dilatation of the ascending thoracic aorta, 4.5 cm. Aortic calcifications and coronary artery calcifications.  Mediastinum/Nodes: No mediastinal, hilar, or axillary adenopathy.  Lungs/Pleura: Scarring in the apices. Improving but persistent airspace opacities in both lower lobes and lingula. Patchy peripheral ground-glass densities and interstitial thickening in the mid and upper lung zones. No effusions.  Upper Abdomen: Imaging into the upper abdomen shows no acute findings.  Musculoskeletal: Chest wall soft tissues are unremarkable. No acute bony abnormality.  IMPRESSION: Improving bilateral lower lobe and lingular infiltrates/pneumonia. Mild residual infiltrates remain. Peripheral ground-glass opacities and  interstitial thickening in the upper and mid lung zones may reflect chronic lung disease/ fibrosis.  Cardiomegaly, coronary artery disease.  Aortic atherosclerosis.  4.5 cm ascending thoracic aortic aneurysm. Recommend semi-annual imaging followup by CTA or MRA and referral to cardiothoracic surgery if not already obtained. This recommendation follows 2010 ACCF/AHA/AATS/ACR/ASA/SCA/SCAI/SIR/STS/SVM Guidelines for the Diagnosis and Management of Patients With Thoracic Aortic Disease. Circulation. 2010; 121: E092-Z300   Electronically Signed   By: Rolm Baptise M.D.   On: 10/22/2016 11:39 I personally reviewed the CT from 10/22/2016, 09/20/2016 and 10/02/2015. I think 4.5 cm is an overestimate on the size of the aneurysm. I think the 4.2 cm noted  on the report from July 2 is more accurate.  Impression: Mrs. Deakins is an 83 year old woman with a small ascending aortic aneurysm. There is no indication for surgery at this time, but she does need follow-up. Overall she is in reasonably good condition for her age would potentially be a candidate for surgery. The indications for surgery would be an aneurysm of 5.5 cm or growth of 5 mm in 6 months.  She understands the mainstay of treatment is blood pressure control. Her hypertension currently is well controlled with metoprolol and diltiazem.  Plan: Return in 6 months with CT angiogram of chest  Melrose Nakayama, MD [facility] [phone]

## 2016-12-01 ENCOUNTER — Other Ambulatory Visit: Payer: Self-pay | Admitting: Cardiology

## 2016-12-01 DIAGNOSIS — I480 Paroxysmal atrial fibrillation: Secondary | ICD-10-CM

## 2016-12-02 DIAGNOSIS — I739 Peripheral vascular disease, unspecified: Secondary | ICD-10-CM | POA: Diagnosis not present

## 2016-12-02 DIAGNOSIS — L603 Nail dystrophy: Secondary | ICD-10-CM | POA: Diagnosis not present

## 2016-12-02 DIAGNOSIS — M79675 Pain in left toe(s): Secondary | ICD-10-CM | POA: Diagnosis not present

## 2016-12-02 DIAGNOSIS — M79674 Pain in right toe(s): Secondary | ICD-10-CM | POA: Diagnosis not present

## 2016-12-02 DIAGNOSIS — B351 Tinea unguium: Secondary | ICD-10-CM | POA: Diagnosis not present

## 2016-12-06 ENCOUNTER — Encounter: Payer: Self-pay | Admitting: Cardiology

## 2016-12-06 DIAGNOSIS — I272 Pulmonary hypertension, unspecified: Secondary | ICD-10-CM | POA: Insufficient documentation

## 2016-12-06 NOTE — Progress Notes (Signed)
Cardiology Office Note:    Date:  12/07/2016   ID:  Fara Olden, DOB [birth year], MRN [id number]  PCP:  Aurea Graff.Marlou Sa, MD  Cardiologist:  Fransico Him, MD   Referring MD: Alroy Dust, Carlean Jews.Marlou Sa, MD   Chief Complaint  Patient presents with  . Follow-up    atrial fibrillation, HTN, PPM    History of Present Illness:    Molly Taylor is a 83 y.o. female with a hx of HTN, persistent atrial fibrillation, tachybrady syndrome s/p PPM and chronic systemic anticoagulation. She was in the hospital in July with PNA and was on home O2 for 2-3 weeks after but is doing better.  She is now starting to get a cough again.  The cough is nonproductive.  She is here today for followup and is doing well.  She denies any chest pain or pressure, SOB, DOE (except for moving around a lot), PND, orthopnea, LE edema, dizziness, palpitations or syncope.    Past Medical History:  Diagnosis Date  . Allergic rhinitis, cause unspecified   . Family history of adverse reaction to anesthesia    all children - PONV  . Generalized osteoarthrosis, unspecified site   . GERD (gastroesophageal reflux disease)    pt. denies  . Hemorrhage of rectum and anus   . History of bronchitis   . HTN (hypertension)   . Hypercholesteremia    pt. denies  . Hypertonicity of bladder   . Pancreatitis, gallstone   . Persistent atrial fibrillation (HCC) paf   CHADS2VASC score is 5  . PONV (postoperative nausea and vomiting)   . Pulmonary HTN ([place])    Moderate with PASP 51mmHg on echo 10/2016 - in setting of PNA  . Renal cell cancer ([place])    pt. denies  . Tachy-brady syndrome ([place])    s/p PPM  . Thoracic aortic aneurysm ([place])    57mm on echo 10/2016  . Urinary incontinence   . Wears glasses    reading  . Wears partial dentures    upper partial    Past Surgical History:  Procedure Laterality Date  . ABDOMINAL HYSTERECTOMY    . COLONOSCOPY    . LAPAROSCOPIC CHOLECYSTECTOMY  2007   lap choli  . left knee replacement  - Dr. French Ana  2012   lt total knee  . PACEMAKER GENERATOR CHANGE  04/2013   MDT ADDRL1 pacemaker implanted by Dr Lovena Le  . PACEMAKER INSERTION  2007  . PERMANENT PACEMAKER GENERATOR CHANGE N/A 05/03/2013   Procedure: PERMANENT PACEMAKER GENERATOR CHANGE;  Surgeon: Evans Lance, MD;  Location: [facility] CATH LAB;  Service: Cardiovascular;  Laterality: N/A;  . PROXIMAL INTERPHALANGEAL FUSION (PIP) Right 08/22/2013   Procedure: EXCISION MUCOID CYST DEBRIDEMENT PROXIMAL INTERPHALANGEAL JOINT;  Surgeon: Wynonia Sours, MD;  Location: [facility];  Service: Orthopedics;  Laterality: Right;  . THUMB ARTHROSCOPY  2007   right  . TOTAL ABDOMINAL HYSTERECTOMY W/ BILATERAL SALPINGOOPHORECTOMY    . TOTAL KNEE ARTHROPLASTY Right 10/03/2015   Procedure: RIGHT TOTAL KNEE ARTHROPLASTY;  Surgeon: Earlie Server, MD;  Location: [place];  Service: Orthopedics;  Laterality: Right;  . TUBAL LIGATION    . VEIN LIGATION      Current Medications: Current Meds  Medication Sig  . amiodarone (PACERONE) 200 MG tablet TAKE ONE TABLET BY MOUTH ONCE DAILY  . CARTIA XT 240 MG 24 hr capsule Take 1 capsule (240 mg total) by mouth daily.  Marland Kitchen ELIQUIS 5 MG TABS tablet TAKE ONE TABLET BY MOUTH  TWICE DAILY  . loratadine (CLARITIN) 10 MG tablet Take 10 mg by mouth daily as needed for allergies.  . metoprolol succinate (TOPROL-XL) 25 MG 24 hr tablet Take 1 tablet (25 mg total) by mouth daily.  . pantoprazole (PROTONIX) 40 MG tablet Take 1 tablet (40 mg total) by mouth 2 (two) times daily.  . polyethylene glycol (MIRALAX / GLYCOLAX) packet Take 17 g by mouth daily.     Allergies:   Hydrocodone; Sulfa antibiotics; and Codeine   Social History   Social History  . Marital status: Married    Spouse name: N/A  . Number of children: N/A  . Years of education: N/A   Social History Main Topics  . Smoking status: Never Smoker  . Smokeless tobacco: Never Used  . Alcohol use No  . Drug use: No  . Sexual activity: Not Asked     Other Topics Concern  . None   Social History Narrative  . None     Family History: The patient's family history includes Hypertension in her other.  ROS:   Please see the history of present illness.     All other systems reviewed and are negative.  EKGs/Labs/Other Studies Reviewed:    The following studies were reviewed today: none  EKG:  EKG is not ordered today.  Recent Labs: 12/08/2015: TSH 1.26 09/19/2016: ALT 13; B Natriuretic Peptide 124.1 09/22/2016: BUN 11; Creatinine, Ser 0.69; Potassium 3.9; Sodium 138 09/23/2016: Hemoglobin 11.9; Platelets 181   Recent Lipid Panel No results found for: CHOL, TRIG, HDL, CHOLHDL, VLDL, LDLCALC, LDLDIRECT  Physical Exam:    VS:  BP 130/76   Pulse 99   Ht 5\' 5"  (1.651 m)   Wt 165 lb 6.4 oz (75 kg)   SpO2 93%   BMI 27.52 kg/m     Wt Readings from Last 3 Encounters:  12/07/16 165 lb 6.4 oz (75 kg)  11/23/16 168 lb (76.2 kg)  11/01/16 162 lb 12.8 oz (73.8 kg)     GEN:  Well nourished, well developed in no acute distress HEENT: Normal NECK: No JVD; No carotid bruits LYMPHATICS: No lymphadenopathy CARDIAC: RRR, no murmurs, rubs, gallops RESPIRATORY:  Clear to auscultation without rales, wheezing or rhonchi  ABDOMEN: Soft, non-tender, non-distended MUSCULOSKELETAL:  No edema; No deformity  SKIN: Warm and dry NEUROLOGIC:  Alert and oriented x 3 PSYCHIATRIC:  Normal affect   ASSESSMENT:    1. Persistent atrial fibrillation ([place])   2. Essential hypertension   3. Tachy-brady syndrome ([place])   4. Thoracic aortic aneurysm without rupture ([place])   5. Pulmonary HTN ([place])    PLAN:    In order of problems listed above:  1. Persistent atrial fibrillation - she is maintaining NSR without any palpitations.  She will continue on Amio 200mg  daily, Cartia XT 240mg  daily and Toprol XL 25mg  daily.  She is on Eliquis 5mg  BID for a CHADS2VASC score of 5.  Her BMET and CBC were stable in 09/2016.  Creatinine 0.69 and Hbg 11.9.  2. HTN -  her BP is well controlled on exam today. She will continue on Cartia XT and Toprol XL.   3.   Tachy-brady syndrome s/p PPM - followed in device clinic.  4.   Thoracic aortic aneurysm - stable by echo 09/2016 at 71mm and 20mm by chest CT. Marland Kitchen She is followed by Dr. Cyndia Bent.   She gets a yearly Chest CT to follow.   5.   Moderate pulmonary HTN - PASP 31mmHg by  echo 09/2016.  This was done shortly after PNA and likely related to hypoxia.  Will repeat to make sure that PAP has decreased.    Medication Adjustments/Labs and Tests Ordered: Current medicines are reviewed at length with the patient today.  Concerns regarding medicines are outlined above.  No orders of the defined types were placed in this encounter.  No orders of the defined types were placed in this encounter.   Signed, Fransico Him, MD  12/07/2016 9:37 AM    [place]

## 2016-12-07 ENCOUNTER — Ambulatory Visit
Admission: RE | Admit: 2016-12-07 | Discharge: 2016-12-07 | Disposition: A | Discharge status: Home / Self Care | Payer: Medicare Other | Source: Ambulatory Visit | Attending: Cardiology | Admitting: Cardiology

## 2016-12-07 ENCOUNTER — Encounter: Payer: Self-pay | Admitting: Cardiology

## 2016-12-07 ENCOUNTER — Ambulatory Visit (INDEPENDENT_AMBULATORY_CARE_PROVIDER_SITE_OTHER): Payer: Medicare Other | Admitting: Cardiology

## 2016-12-07 VITALS — BP 130/76 | HR 99 | Ht 65.0 in | Wt 165.4 lb

## 2016-12-07 DIAGNOSIS — I481 Persistent atrial fibrillation: Secondary | ICD-10-CM

## 2016-12-07 DIAGNOSIS — I712 Thoracic aortic aneurysm, without rupture, unspecified: Secondary | ICD-10-CM

## 2016-12-07 DIAGNOSIS — I4819 Other persistent atrial fibrillation: Secondary | ICD-10-CM

## 2016-12-07 DIAGNOSIS — I495 Sick sinus syndrome: Secondary | ICD-10-CM | POA: Diagnosis not present

## 2016-12-07 DIAGNOSIS — I272 Pulmonary hypertension, unspecified: Secondary | ICD-10-CM

## 2016-12-07 DIAGNOSIS — I10 Essential (primary) hypertension: Secondary | ICD-10-CM

## 2016-12-07 DIAGNOSIS — R05 Cough: Secondary | ICD-10-CM | POA: Diagnosis not present

## 2016-12-07 NOTE — Patient Instructions (Signed)
Scheduled Limited Echo   Chest Xray [facility] [place] today   Your physician wants you to follow-up in: 6 months. You will receive a reminder letter in the mail two months in advance. If you don't receive a letter, please call our office to schedule the follow-up appointment.

## 2016-12-15 ENCOUNTER — Other Ambulatory Visit: Payer: Self-pay

## 2016-12-15 ENCOUNTER — Ambulatory Visit (HOSPITAL_COMMUNITY): Payer: Medicare Other | Attending: Internal Medicine

## 2016-12-15 DIAGNOSIS — I481 Persistent atrial fibrillation: Secondary | ICD-10-CM | POA: Diagnosis not present

## 2016-12-15 DIAGNOSIS — I272 Pulmonary hypertension, unspecified: Secondary | ICD-10-CM | POA: Diagnosis not present

## 2016-12-15 DIAGNOSIS — I4819 Other persistent atrial fibrillation: Secondary | ICD-10-CM

## 2016-12-15 DIAGNOSIS — I495 Sick sinus syndrome: Secondary | ICD-10-CM | POA: Diagnosis not present

## 2016-12-15 DIAGNOSIS — I712 Thoracic aortic aneurysm, without rupture, unspecified: Secondary | ICD-10-CM

## 2016-12-15 DIAGNOSIS — I77810 Thoracic aortic ectasia: Secondary | ICD-10-CM | POA: Diagnosis not present

## 2016-12-15 DIAGNOSIS — I10 Essential (primary) hypertension: Secondary | ICD-10-CM | POA: Diagnosis not present

## 2016-12-15 DIAGNOSIS — I071 Rheumatic tricuspid insufficiency: Secondary | ICD-10-CM | POA: Diagnosis not present

## 2016-12-16 ENCOUNTER — Encounter: Payer: Self-pay | Admitting: Cardiology

## 2016-12-17 ENCOUNTER — Telehealth: Payer: Self-pay | Admitting: Cardiology

## 2016-12-17 DIAGNOSIS — I272 Pulmonary hypertension, unspecified: Secondary | ICD-10-CM

## 2016-12-17 NOTE — Telephone Encounter (Signed)
  Notes recorded by Sueanne Margarita, MD on 12/16/2016 at 6:05 PM EDT Echo showed normal LVF with mild LVH, trivial AR, mildly dilated ascending aorta and moderate TR with moderate pulmonary HTN - repeat study in 1 year     Informed patient of results and verbal understanding expressed.  Repeat echo ordered to be scheduled in 1 year. Patient agrees with treatment plan.

## 2016-12-17 NOTE — Telephone Encounter (Signed)
Fu Message  Returning call about test results. Please call back to discuss

## 2016-12-27 ENCOUNTER — Ambulatory Visit (INDEPENDENT_AMBULATORY_CARE_PROVIDER_SITE_OTHER): Payer: Medicare Other | Admitting: *Deleted

## 2016-12-27 ENCOUNTER — Telehealth: Payer: Self-pay | Admitting: Cardiology

## 2016-12-27 DIAGNOSIS — I495 Sick sinus syndrome: Secondary | ICD-10-CM

## 2016-12-27 NOTE — Telephone Encounter (Signed)
Spoke with pt and reminded pt of remote transmission that is due today. Pt verbalized understanding.   

## 2016-12-28 LAB — CUP PACEART REMOTE DEVICE CHECK
Battery Remaining Longevity: 95 mo
Brady Statistic AP VS Percent: 0 %
Brady Statistic AS VP Percent: 0 %
Brady Statistic AS VS Percent: 0 %
Date Time Interrogation Session: 20181008190137
Implantable Lead Implant Date: 20070313
Implantable Lead Implant Date: 20070313
Implantable Lead Location: 753860
Implantable Lead Model: 5076
Lead Channel Impedance Value: 617 Ohm
Lead Channel Pacing Threshold Amplitude: 0.75 V
Lead Channel Pacing Threshold Amplitude: 1 V
Lead Channel Pacing Threshold Pulse Width: 0.4 ms
Lead Channel Pacing Threshold Pulse Width: 0.4 ms
Lead Channel Setting Sensing Sensitivity: 5.6 mV
MDC IDC LEAD LOCATION: 753859
MDC IDC MSMT BATTERY IMPEDANCE: 281 Ohm
MDC IDC MSMT BATTERY VOLTAGE: 2.78 V
MDC IDC MSMT LEADCHNL RA IMPEDANCE VALUE: 486 Ohm
MDC IDC PG IMPLANT DT: 20150212
MDC IDC SET LEADCHNL RA PACING AMPLITUDE: 2 V
MDC IDC SET LEADCHNL RV PACING AMPLITUDE: 2.5 V
MDC IDC SET LEADCHNL RV PACING PULSEWIDTH: 0.4 ms
MDC IDC STAT BRADY AP VP PERCENT: 100 %

## 2016-12-28 NOTE — Progress Notes (Signed)
Remote pacemaker transmission.   

## 2016-12-31 ENCOUNTER — Encounter: Payer: Self-pay | Admitting: Cardiology

## 2016-12-31 ENCOUNTER — Other Ambulatory Visit: Payer: Self-pay | Admitting: Cardiology

## 2017-01-03 ENCOUNTER — Telehealth: Payer: Self-pay

## 2017-01-03 NOTE — Telephone Encounter (Signed)
**Note De-Identified Molly Taylor Obfuscation** The pt and another lady walked into the office today stating that the pt cannot afford her Metoprolol or Cartia and wanting to apply for pt assistance. I called the pts pharmacy, [facility], to find out how much the pt is paying for these medications prior to walking out to talk to them. I was advised that it will cost the pt $73.56 for a 90 day supply of Cartia and $23.36 for a 90 day supply of Metoprolol.  I walked out to lobby and spoke with the pt and the lady that was with her. I explained to them that if a medication has a generic that we cannot apply for pt assistance as that is for brand name medications only.  The pt stated that she did not realize that the prescriptions were sent in for a 90 day supply and that is why the price is higher than normal. She states that she will go to her pharmacy when she leaves here and request a 30 days supply of both Cartia and Metoprolol to get a lower cost.  She is advised that what she is paying for Metoprolol is about the lowest that I am aware of and that if she cannot afford her Geronimo Boot I can send Dr Radford Pax a message asking to change Cartia to a medication that cost less.  The lady that was with the pt stated that they will go to her pharmacy and if the pt cannot afford her Geronimo Boot they will call me back for assistance with changing Cartia to a lower csting medication.

## 2017-01-04 ENCOUNTER — Encounter: Payer: Self-pay | Admitting: Emergency Medicine

## 2017-01-04 ENCOUNTER — Ambulatory Visit (INDEPENDENT_AMBULATORY_CARE_PROVIDER_SITE_OTHER): Payer: Medicare Other | Admitting: Emergency Medicine

## 2017-01-04 DIAGNOSIS — R058 Other specified cough: Secondary | ICD-10-CM | POA: Insufficient documentation

## 2017-01-04 DIAGNOSIS — R05 Cough: Secondary | ICD-10-CM | POA: Diagnosis not present

## 2017-01-04 MED ORDER — FLUTICASONE PROPIONATE 50 MCG/ACT NA SUSP: 2.0000 | Freq: Every day | NASAL | 5 refills | Status: AC

## 2017-01-04 NOTE — Progress Notes (Signed)
Subjective:    Patient ID: Molly Taylor, female    DOB: Jun 03, 1926, 83 y.o.   MRN: [id number]  HPI 83 year old never smoker with a history of atrial fibrillation and tachybradycardia syndrome, symptomatic bradycardia with pacemaker in place. She is on anticoagulation and amiodarone. Late June she had progression of deep cough, usually at night. She was hospitalized from 7/1-09/24/16 with a multi lobar pneumonia suspected related to gastroesophageal reflux and aspiration. A CT scan of her chest on 09/20/16 was reviewed by me. This shows bilateral lower lobe patchy airspace consolidation and some nonspecific areas of bilateral upper lobe groundglass. Her dyspnea is better. Her nocturnal cough is better with swallowing modification, head of bed up.   ROV 01/04/17 -- This follow-up visit for patient with a history as above, admitted in July 2018 with a multilobar pneumonia that was felt related to LPR and aspiration. A CT at that time showed bibasilar consolidation. She had a repeat CT and was seen in our office 11/01/16. The CT from 10/22/16 showed improvement in her pulmonary infiltrates and a small ascending aortic aneurysm. She has seen Dr. Roxan Hockey for this and is planning follow up. She has been practicing swallowing precautions. She is having consistent cough at night for the last several weeks, is waking her up at night, prod of clear mucous. No breakthrough GERD sx. She is having more rhinitis, nasal congestion. She is sleeping on 3 pillows. No fever.    Review of Systems  Constitutional: Positive for appetite change. Negative for fever and unexpected weight change.  HENT: Positive for sneezing. Negative for congestion, dental problem, ear pain, nosebleeds, postnasal drip, rhinorrhea, sinus pressure, sore throat and trouble swallowing.   Eyes: Negative for redness and itching.  Respiratory: Positive for cough and shortness of breath. Negative for chest tightness and wheezing.   Cardiovascular:  Negative for palpitations and leg swelling.  Gastrointestinal: Negative for nausea and vomiting.  Genitourinary: Negative for dysuria.  Musculoskeletal: Negative for joint swelling.  Skin: Negative for rash.  Neurological: Negative for headaches.  Hematological: Does not bruise/bleed easily.  Psychiatric/Behavioral: Negative for dysphoric mood. The patient is not nervous/anxious.     Past Medical History:  Diagnosis Date  . Allergic rhinitis, cause unspecified   . Family history of adverse reaction to anesthesia    all children - PONV  . Generalized osteoarthrosis, unspecified site   . GERD (gastroesophageal reflux disease)    pt. denies  . Hemorrhage of rectum and anus   . History of bronchitis   . HTN (hypertension)   . Hypercholesteremia    pt. denies  . Hypertonicity of bladder   . Pancreatitis, gallstone   . Persistent atrial fibrillation (HCC) paf   CHADS2VASC score is 5  . PONV (postoperative nausea and vomiting)   . Pulmonary HTN ([place])    Moderate with PASP 39mmHg on echo 11/2016   . Renal cell cancer ([place])    pt. denies  . Tachy-brady syndrome ([place])    s/p PPM  . Thoracic aortic aneurysm ([place])    54mm on echo 11/2016  . Urinary incontinence   . Wears glasses    reading  . Wears partial dentures    upper partial     Family History  Problem Relation Age of Onset  . Hypertension Other      Social History   Social History  . Marital status: Married    Spouse name: N/A  . Number of children: N/A  . Years of education: N/A  Occupational History  . Not on file.   Social History Main Topics  . Smoking status: Never Smoker  . Smokeless tobacco: Never Used  . Alcohol use No  . Drug use: No  . Sexual activity: Not on file   Other Topics Concern  . Not on file   Social History Narrative  . No narrative on file     Allergies  Allergen Reactions  . Hydrocodone     Excessive vomiting  . Sulfa Antibiotics Swelling  . Codeine Nausea And Vomiting      Outpatient Medications Prior to Visit  Medication Sig Dispense Refill  . amiodarone (PACERONE) 200 MG tablet TAKE ONE TABLET BY MOUTH ONCE DAILY 90 tablet 2  . CARTIA XT 240 MG 24 hr capsule TAKE ONE CAPSULE BY MOUTH ONCE DAILY 90 capsule 3  . ELIQUIS 5 MG TABS tablet TAKE ONE TABLET BY MOUTH TWICE DAILY 180 tablet 3  . loratadine (CLARITIN) 10 MG tablet Take 10 mg by mouth daily as needed for allergies.    . metoprolol succinate (TOPROL-XL) 25 MG 24 hr tablet TAKE ONE TABLET BY MOUTH ONCE DAILY 90 tablet 3  . pantoprazole (PROTONIX) 40 MG tablet Take 1 tablet (40 mg total) by mouth 2 (two) times daily. 60 tablet 0  . polyethylene glycol (MIRALAX / GLYCOLAX) packet Take 17 g by mouth daily. 14 each 0   No facility-administered medications prior to visit.         Objective:   Physical Exam Vitals:   01/04/17 1130  BP: 122/78  Pulse: 77  SpO2: 94%  Weight: 165 lb 2 oz (74.9 kg)  Height: 5\' 6"  (1.676 m)   Gen: Pleasant, Thin elderly woman, in no distress,  normal affect  ENT: No lesions,  mouth clear,  oropharynx clear, no postnasal drip  Neck: No JVD, no TMG, no carotid bruits  Lungs: No use of accessory muscles, few scattered bibasilar crackles, most;y clear, no wheeze  Cardiovascular: RRR, heart sounds normal, no murmur or gallops, trace bilateral ankle edema  Musculoskeletal: No deformities, no cyanosis or clubbing  Neuro: alert, non focal  Skin: Warm, no lesions or rashes    10/22/16 --  COMPARISON:  09/20/2016.  Chest x-ray 09/19/2016  FINDINGS: Cardiovascular: Cardiomegaly. Pacer wires in the right atrium and right ventricle. Mild dilatation of the ascending thoracic aorta, 4.5 cm. Aortic calcifications and coronary artery calcifications.  Mediastinum/Nodes: No mediastinal, hilar, or axillary adenopathy.  Lungs/Pleura: Scarring in the apices. Improving but persistent airspace opacities in both lower lobes and lingula. Patchy peripheral ground-glass  densities and interstitial thickening in the mid and upper lung zones. No effusions.  Upper Abdomen: Imaging into the upper abdomen shows no acute findings.  Musculoskeletal: Chest wall soft tissues are unremarkable. No acute bony abnormality.  IMPRESSION: Improving bilateral lower lobe and lingular infiltrates/pneumonia. Mild residual infiltrates remain. Peripheral ground-glass opacities and interstitial thickening in the upper and mid lung zones may reflect chronic lung disease/ fibrosis.  Cardiomegaly, coronary artery disease.  Aortic atherosclerosis.  4.5 cm ascending thoracic aortic aneurysm. Recommend semi-annual imaging followup by CTA or MRA and referral to cardiothoracic surgery if not already obtained.      Assessment & Plan:  Nocturnal cough Her reflux appears to be well controlled on her current regimen. She is having breakthrough chronic rhinitis symptoms. She denies any significant aspiration when eating or drinking since she has been doing her swallowing precautions. No evidence to support a community-acquired or aspiration pneumonia.  Please continue pantoprazole  40 mg twice a day as you have been taking it Please restart your loratadine 10 mg daily every day during the fall allergy season Please start fluticasone nasal spray, 2 sprays each nostril every evening. Take this at least 1-2 hours before bedtime.  Follow up with Dr Lamonte Sakai in November as already planned.  Baltazar Apo, MD, PhD 01/04/2017, 12:07 PM [facility] [phone] or if no answer [phone]

## 2017-01-04 NOTE — Assessment & Plan Note (Signed)
Her reflux appears to be well controlled on her current regimen. She is having breakthrough chronic rhinitis symptoms. She denies any significant aspiration when eating or drinking since she has been doing her swallowing precautions. No evidence to support a community-acquired or aspiration pneumonia.  Please continue pantoprazole 40 mg twice a day as you have been taking it Please restart your loratadine 10 mg daily every day during the fall allergy season Please start fluticasone nasal spray, 2 sprays each nostril every evening. Take this at least 1-2 hours before bedtime.  Follow up with Dr Lamonte Sakai in November as already planned.

## 2017-01-04 NOTE — Patient Instructions (Addendum)
Please continue pantoprazole 40 mg twice a day as you have been taking it Please restart your loratadine 10 mg daily every day during the fall allergy season Please start fluticasone nasal spray, 2 sprays each nostril every evening. Take this at least 1-2 hours before bedtime.  Follow up with Dr Lamonte Sakai in November as already planned.

## 2017-01-13 DIAGNOSIS — Z23 Encounter for immunization: Secondary | ICD-10-CM | POA: Diagnosis not present

## 2017-01-17 DIAGNOSIS — J309 Allergic rhinitis, unspecified: Secondary | ICD-10-CM | POA: Diagnosis not present

## 2017-01-17 DIAGNOSIS — K219 Gastro-esophageal reflux disease without esophagitis: Secondary | ICD-10-CM | POA: Diagnosis not present

## 2017-01-17 DIAGNOSIS — I480 Paroxysmal atrial fibrillation: Secondary | ICD-10-CM | POA: Diagnosis not present

## 2017-01-17 DIAGNOSIS — I10 Essential (primary) hypertension: Secondary | ICD-10-CM | POA: Diagnosis not present

## 2017-01-17 DIAGNOSIS — M150 Primary generalized (osteo)arthritis: Secondary | ICD-10-CM | POA: Diagnosis not present

## 2017-02-01 ENCOUNTER — Ambulatory Visit (INDEPENDENT_AMBULATORY_CARE_PROVIDER_SITE_OTHER): Payer: Medicare Other | Admitting: Emergency Medicine

## 2017-02-01 ENCOUNTER — Encounter: Payer: Self-pay | Admitting: Emergency Medicine

## 2017-02-01 DIAGNOSIS — R05 Cough: Secondary | ICD-10-CM | POA: Diagnosis not present

## 2017-02-01 DIAGNOSIS — I4819 Other persistent atrial fibrillation: Secondary | ICD-10-CM

## 2017-02-01 DIAGNOSIS — I481 Persistent atrial fibrillation: Secondary | ICD-10-CM | POA: Diagnosis not present

## 2017-02-01 DIAGNOSIS — R0602 Shortness of breath: Secondary | ICD-10-CM | POA: Diagnosis not present

## 2017-02-01 DIAGNOSIS — R058 Other specified cough: Secondary | ICD-10-CM

## 2017-02-01 NOTE — Assessment & Plan Note (Signed)
Some improvement with more aggressive treatment of her allergic rhinitis.  She remains on GERD therapy.  We discussed stepwise removal of her allergy medications this winter.  She understands that she will likely need to be on both loratadine and fluticasone nasal spray during the fall and spring months.  Discussed aspiration precautions.

## 2017-02-01 NOTE — Assessment & Plan Note (Signed)
On amiodarone.  No evidence of amiodarone toxicity to date.

## 2017-02-01 NOTE — Progress Notes (Signed)
Subjective:    Patient ID: Molly Taylor, female    DOB: [birth year], 83 y.o.   MRN: [id number]  HPI 83 year old never smoker with a history of atrial fibrillation and tachybradycardia syndrome, symptomatic bradycardia with pacemaker in place. She is on anticoagulation and amiodarone. Late June she had progression of deep cough, usually at night. She was hospitalized from 7/1-09/24/16 with a multi lobar pneumonia suspected related to gastroesophageal reflux and aspiration. A CT scan of her chest on 09/20/16 was reviewed by me. This shows bilateral lower lobe patchy airspace consolidation and some nonspecific areas of bilateral upper lobe groundglass. Her dyspnea is better. Her nocturnal cough is better with swallowing modification, head of bed up.   ROV 01/04/17 -- This follow-up visit for patient with a history as above, admitted in July 2018 with a multilobar pneumonia that was felt related to LPR and aspiration. A CT at that time showed bibasilar consolidation. She had a repeat CT and was seen in our office 11/01/16. The CT from 10/22/16 showed improvement in her pulmonary infiltrates and a small ascending aortic aneurysm. She has seen Dr. Roxan Hockey for this and is planning follow up. She has been practicing swallowing precautions. She is having consistent cough at night for the last several weeks, is waking her up at night, prod of clear mucous. No breakthrough GERD sx. She is having more rhinitis, nasal congestion. She is sleeping on 3 pillows. No fever.   ROV 02/01/17 --pleasant 83 year old woman, never smoker, with atrial fibrillation/tachycardia bradycardia syndrome, pacemaker, amiodarone use, suspected chronic reflux and aspiration.  She has been admitted with aspiration pneumonias with CT scans as described above.  She started flonase and claritin last month. She feels that her cough is better. No aspiration episodes. Not really paying attention to swallowing precautions.    Review of Systems    Constitutional: Positive for appetite change. Negative for fever and unexpected weight change.  HENT: Positive for sneezing. Negative for congestion, dental problem, ear pain, nosebleeds, postnasal drip, rhinorrhea, sinus pressure, sore throat and trouble swallowing.   Eyes: Negative for redness and itching.  Respiratory: Positive for cough and shortness of breath. Negative for chest tightness and wheezing.   Cardiovascular: Negative for palpitations and leg swelling.  Gastrointestinal: Negative for nausea and vomiting.  Genitourinary: Negative for dysuria.  Musculoskeletal: Negative for joint swelling.  Skin: Negative for rash.  Neurological: Negative for headaches.  Hematological: Does not bruise/bleed easily.  Psychiatric/Behavioral: Negative for dysphoric mood. The patient is not nervous/anxious.     Past Medical History:  Diagnosis Date  . Allergic rhinitis, cause unspecified   . Family history of adverse reaction to anesthesia    all children - PONV  . Generalized osteoarthrosis, unspecified site   . GERD (gastroesophageal reflux disease)    pt. denies  . Hemorrhage of rectum and anus   . History of bronchitis   . HTN (hypertension)   . Hypercholesteremia    pt. denies  . Hypertonicity of bladder   . Pancreatitis, gallstone   . Persistent atrial fibrillation (HCC) paf   CHADS2VASC score is 5  . PONV (postoperative nausea and vomiting)   . Pulmonary HTN ([place])    Moderate with PASP 60mmHg on echo 11/2016   . Renal cell cancer ([place])    pt. denies  . Tachy-brady syndrome ([place])    s/p PPM  . Thoracic aortic aneurysm ([place])    32mm on echo 11/2016  . Urinary incontinence   . Wears glasses  reading  . Wears partial dentures    upper partial     Family History  Problem Relation Age of Onset  . Hypertension Other      Social History   Socioeconomic History  . Marital status: Married    Spouse name: Not on file  . Number of children: Not on file  . Years of  education: Not on file  . Highest education level: Not on file  Social Needs  . Financial resource strain: Not on file  . Food insecurity - worry: Not on file  . Food insecurity - inability: Not on file  . Transportation needs - medical: Not on file  . Transportation needs - non-medical: Not on file  Occupational History  . Not on file  Tobacco Use  . Smoking status: Never Smoker  . Smokeless tobacco: Never Used  Substance and Sexual Activity  . Alcohol use: No  . Drug use: No  . Sexual activity: Not on file  Other Topics Concern  . Not on file  Social History Narrative  . Not on file     Allergies  Allergen Reactions  . Hydrocodone     Excessive vomiting  . Sulfa Antibiotics Swelling  . Codeine Nausea And Vomiting     Outpatient Medications Prior to Visit  Medication Sig Dispense Refill  . amiodarone (PACERONE) 200 MG tablet TAKE ONE TABLET BY MOUTH ONCE DAILY 90 tablet 2  . CARTIA XT 240 MG 24 hr capsule TAKE ONE CAPSULE BY MOUTH ONCE DAILY 90 capsule 3  . ELIQUIS 5 MG TABS tablet TAKE ONE TABLET BY MOUTH TWICE DAILY 180 tablet 3  . fluticasone (FLONASE) 50 MCG/ACT nasal spray Place 2 sprays into both nostrils daily. 16 g 5  . loratadine (CLARITIN) 10 MG tablet Take 10 mg by mouth daily as needed for allergies.    . metoprolol succinate (TOPROL-XL) 25 MG 24 hr tablet TAKE ONE TABLET BY MOUTH ONCE DAILY 90 tablet 3  . pantoprazole (PROTONIX) 40 MG tablet Take 1 tablet (40 mg total) by mouth 2 (two) times daily. 60 tablet 0  . polyethylene glycol (MIRALAX / GLYCOLAX) packet Take 17 g by mouth daily. 14 each 0   No facility-administered medications prior to visit.         Objective:   Physical Exam Vitals:   02/01/17 1040  BP: 138/82  Pulse: 98  SpO2: 94%  Weight: 165 lb (74.8 kg)  Height: 5\' 5"  (1.651 m)   Gen: Pleasant, Thin elderly woman, in no distress,  normal affect  ENT: No lesions,  mouth clear,  oropharynx clear, no postnasal drip  Neck: No JVD, no  TMG, no carotid bruits  Lungs: No use of accessory muscles, few scattered bibasilar crackles, most;y clear, no wheeze  Cardiovascular: RRR, heart sounds normal, no murmur or gallops, trace bilateral ankle edema  Musculoskeletal: No deformities, no cyanosis or clubbing  Neuro: alert, non focal  Skin: Warm, no lesions or rashes    10/22/16 --  COMPARISON:  09/20/2016.  Chest x-ray 09/19/2016  FINDINGS: Cardiovascular: Cardiomegaly. Pacer wires in the right atrium and right ventricle. Mild dilatation of the ascending thoracic aorta, 4.5 cm. Aortic calcifications and coronary artery calcifications.  Mediastinum/Nodes: No mediastinal, hilar, or axillary adenopathy.  Lungs/Pleura: Scarring in the apices. Improving but persistent airspace opacities in both lower lobes and lingula. Patchy peripheral ground-glass densities and interstitial thickening in the mid and upper lung zones. No effusions.  Upper Abdomen: Imaging into the upper abdomen shows  no acute findings.  Musculoskeletal: Chest wall soft tissues are unremarkable. No acute bony abnormality.  IMPRESSION: Improving bilateral lower lobe and lingular infiltrates/pneumonia. Mild residual infiltrates remain. Peripheral ground-glass opacities and interstitial thickening in the upper and mid lung zones may reflect chronic lung disease/ fibrosis.  Cardiomegaly, coronary artery disease.  Aortic atherosclerosis.  4.5 cm ascending thoracic aortic aneurysm. Recommend semi-annual imaging followup by CTA or MRA and referral to cardiothoracic surgery if not already obtained.      Assessment & Plan:  Persistent atrial fibrillation (HCC) On amiodarone.  No evidence of amiodarone toxicity to date.  Nocturnal cough Some improvement with more aggressive treatment of her allergic rhinitis.  She remains on GERD therapy.  We discussed stepwise removal of her allergy medications this winter.  She understands that she will likely  need to be on both loratadine and fluticasone nasal spray during the fall and spring months.  Discussed aspiration precautions.  SOB (shortness of breath) Improved following recent aspiration pneumonia summer 2018.   Baltazar Apo, MD, PhD 02/01/2017, 11:13 AM [facility] [phone] or if no answer [phone]

## 2017-02-01 NOTE — Patient Instructions (Addendum)
Please continue your pantoprazole as you are taking it.  Continue your loratadine 10mg  daily Continue your fluticasone nasal spray through January, then you can experiment with stopping it. You will likely need to restart for the Spring and Fall months.  Follow with Dr Lamonte Sakai in 6 months or sooner if you have any problems

## 2017-02-01 NOTE — Assessment & Plan Note (Signed)
Improved following recent aspiration pneumonia summer 2018.

## 2017-02-08 DIAGNOSIS — M25561 Pain in right knee: Secondary | ICD-10-CM | POA: Diagnosis not present

## 2017-02-08 DIAGNOSIS — M25562 Pain in left knee: Secondary | ICD-10-CM | POA: Diagnosis not present

## 2017-02-14 ENCOUNTER — Telehealth: Payer: Self-pay

## 2017-02-14 NOTE — Telephone Encounter (Signed)
The pt is advised that her Eliquis has arrived at the office via mail and that she can pick up at her convenience. She verbalized understanding and states that she will pick up soon.

## 2017-02-15 DIAGNOSIS — R531 Weakness: Secondary | ICD-10-CM | POA: Diagnosis not present

## 2017-02-15 DIAGNOSIS — M25562 Pain in left knee: Secondary | ICD-10-CM | POA: Diagnosis not present

## 2017-02-22 DIAGNOSIS — R531 Weakness: Secondary | ICD-10-CM | POA: Diagnosis not present

## 2017-02-22 DIAGNOSIS — M25562 Pain in left knee: Secondary | ICD-10-CM | POA: Diagnosis not present

## 2017-02-24 DIAGNOSIS — M25562 Pain in left knee: Secondary | ICD-10-CM | POA: Diagnosis not present

## 2017-02-24 DIAGNOSIS — I739 Peripheral vascular disease, unspecified: Secondary | ICD-10-CM | POA: Diagnosis not present

## 2017-02-24 DIAGNOSIS — R531 Weakness: Secondary | ICD-10-CM | POA: Diagnosis not present

## 2017-02-24 DIAGNOSIS — L603 Nail dystrophy: Secondary | ICD-10-CM | POA: Diagnosis not present

## 2017-03-08 DIAGNOSIS — M25562 Pain in left knee: Secondary | ICD-10-CM | POA: Diagnosis not present

## 2017-03-08 DIAGNOSIS — R531 Weakness: Secondary | ICD-10-CM | POA: Diagnosis not present

## 2017-03-10 DIAGNOSIS — R531 Weakness: Secondary | ICD-10-CM | POA: Diagnosis not present

## 2017-03-10 DIAGNOSIS — M25562 Pain in left knee: Secondary | ICD-10-CM | POA: Diagnosis not present

## 2017-03-16 DIAGNOSIS — R05 Cough: Secondary | ICD-10-CM | POA: Diagnosis not present

## 2017-03-16 DIAGNOSIS — J069 Acute upper respiratory infection, unspecified: Secondary | ICD-10-CM | POA: Diagnosis not present

## 2017-03-28 ENCOUNTER — Ambulatory Visit (INDEPENDENT_AMBULATORY_CARE_PROVIDER_SITE_OTHER): Payer: Medicare Other | Admitting: *Deleted

## 2017-03-28 ENCOUNTER — Telehealth: Payer: Self-pay | Admitting: Cardiology

## 2017-03-28 DIAGNOSIS — I495 Sick sinus syndrome: Secondary | ICD-10-CM | POA: Diagnosis not present

## 2017-03-28 NOTE — Telephone Encounter (Signed)
LMOVM reminding pt to send remote transmission.   

## 2017-03-29 ENCOUNTER — Telehealth: Payer: Self-pay | Admitting: *Deleted

## 2017-03-29 NOTE — Telephone Encounter (Signed)
Returned patient's call.  She reports she was unable to understand the message requesting a PPM remote transmission yesterday.  Patient verbalizes understanding and states her daughter will help her transmit tomorrow.  Offered to assist her today, but she states she would prefer to wait for her daughter's assistance.  Patient agrees to call back with any further questions or concerns and is appreciative of call.

## 2017-03-29 NOTE — Telephone Encounter (Signed)
Patient calling, states that she is returning call to device clinic from yesterday.

## 2017-03-31 ENCOUNTER — Encounter: Payer: Self-pay | Admitting: Cardiology

## 2017-03-31 NOTE — Progress Notes (Signed)
Remote pacemaker transmission.   

## 2017-04-04 LAB — CUP PACEART REMOTE DEVICE CHECK
Battery Impedance: 282 Ohm
Brady Statistic AP VS Percent: 0 %
Brady Statistic AS VS Percent: 0 %
Date Time Interrogation Session: 20190109161116
Implantable Lead Implant Date: 20070313
Implantable Lead Location: 753859
Implantable Lead Location: 753860
Implantable Lead Model: 5076
Lead Channel Impedance Value: 478 Ohm
Lead Channel Impedance Value: 615 Ohm
Lead Channel Pacing Threshold Amplitude: 1 V
Lead Channel Pacing Threshold Pulse Width: 0.4 ms
Lead Channel Pacing Threshold Pulse Width: 0.4 ms
Lead Channel Setting Pacing Amplitude: 2.5 V
Lead Channel Setting Sensing Sensitivity: 5.6 mV
MDC IDC LEAD IMPLANT DT: 20070313
MDC IDC MSMT BATTERY REMAINING LONGEVITY: 94 mo
MDC IDC MSMT BATTERY VOLTAGE: 2.79 V
MDC IDC MSMT LEADCHNL RA PACING THRESHOLD AMPLITUDE: 0.75 V
MDC IDC PG IMPLANT DT: 20150212
MDC IDC SET LEADCHNL RA PACING AMPLITUDE: 2 V
MDC IDC SET LEADCHNL RV PACING PULSEWIDTH: 0.4 ms
MDC IDC STAT BRADY AP VP PERCENT: 100 %
MDC IDC STAT BRADY AS VP PERCENT: 0 %

## 2017-04-09 DIAGNOSIS — J0110 Acute frontal sinusitis, unspecified: Secondary | ICD-10-CM | POA: Diagnosis not present

## 2017-04-09 DIAGNOSIS — R112 Nausea with vomiting, unspecified: Secondary | ICD-10-CM | POA: Diagnosis not present

## 2017-04-26 ENCOUNTER — Ambulatory Visit
Admission: RE | Admit: 2017-04-26 | Discharge: 2017-04-26 | Disposition: A | Discharge status: Home / Self Care | Payer: Medicare Other | Source: Ambulatory Visit | Attending: Family Medicine | Admitting: Family Medicine

## 2017-04-26 ENCOUNTER — Other Ambulatory Visit: Payer: Self-pay | Admitting: Family Medicine

## 2017-04-26 DIAGNOSIS — R059 Cough, unspecified: Secondary | ICD-10-CM

## 2017-04-26 DIAGNOSIS — R918 Other nonspecific abnormal finding of lung field: Secondary | ICD-10-CM

## 2017-04-26 DIAGNOSIS — R05 Cough: Secondary | ICD-10-CM

## 2017-04-26 DIAGNOSIS — J069 Acute upper respiratory infection, unspecified: Secondary | ICD-10-CM | POA: Diagnosis not present

## 2017-04-27 ENCOUNTER — Ambulatory Visit
Admission: RE | Admit: 2017-04-27 | Discharge: 2017-04-27 | Disposition: A | Discharge status: Home / Self Care | Payer: Medicare Other | Source: Ambulatory Visit | Attending: Family Medicine | Admitting: Family Medicine

## 2017-04-27 DIAGNOSIS — R918 Other nonspecific abnormal finding of lung field: Secondary | ICD-10-CM

## 2017-05-11 ENCOUNTER — Other Ambulatory Visit: Payer: Self-pay | Admitting: Thoracic Surgery (Cardiothoracic Vascular Surgery)

## 2017-05-11 DIAGNOSIS — I712 Thoracic aortic aneurysm, without rupture, unspecified: Secondary | ICD-10-CM

## 2017-05-23 ENCOUNTER — Ambulatory Visit
Admission: RE | Admit: 2017-05-23 | Discharge: 2017-05-23 | Disposition: A | Discharge status: Home / Self Care | Payer: Medicare Other | Source: Ambulatory Visit | Attending: Family Medicine | Admitting: Family Medicine

## 2017-05-23 ENCOUNTER — Other Ambulatory Visit: Payer: Self-pay | Admitting: Family Medicine

## 2017-05-23 DIAGNOSIS — J189 Pneumonia, unspecified organism: Secondary | ICD-10-CM | POA: Diagnosis not present

## 2017-05-23 DIAGNOSIS — Z09 Encounter for follow-up examination after completed treatment for conditions other than malignant neoplasm: Secondary | ICD-10-CM

## 2017-06-13 DIAGNOSIS — L603 Nail dystrophy: Secondary | ICD-10-CM | POA: Diagnosis not present

## 2017-06-13 DIAGNOSIS — S92414A Nondisplaced fracture of proximal phalanx of right great toe, initial encounter for closed fracture: Secondary | ICD-10-CM | POA: Diagnosis not present

## 2017-06-13 DIAGNOSIS — I739 Peripheral vascular disease, unspecified: Secondary | ICD-10-CM | POA: Diagnosis not present

## 2017-06-14 ENCOUNTER — Telehealth (INDEPENDENT_AMBULATORY_CARE_PROVIDER_SITE_OTHER): Payer: Self-pay | Admitting: Acute Care

## 2017-06-14 ENCOUNTER — Ambulatory Visit
Admission: RE | Admit: 2017-06-14 | Discharge: 2017-06-14 | Disposition: A | Discharge status: Home / Self Care | Payer: Medicare Other | Source: Ambulatory Visit | Attending: Thoracic Surgery (Cardiothoracic Vascular Surgery) | Admitting: Thoracic Surgery (Cardiothoracic Vascular Surgery)

## 2017-06-14 ENCOUNTER — Ambulatory Visit (INDEPENDENT_AMBULATORY_CARE_PROVIDER_SITE_OTHER): Payer: Medicare Other | Admitting: Thoracic Surgery (Cardiothoracic Vascular Surgery)

## 2017-06-14 VITALS — BP 124/75 | HR 96 | Resp 20 | Ht 65.0 in | Wt 165.0 lb

## 2017-06-14 DIAGNOSIS — I712 Thoracic aortic aneurysm, without rupture, unspecified: Secondary | ICD-10-CM

## 2017-06-14 MED ORDER — IOPAMIDOL (ISOVUE-370) INJECTION 76%
75.0000 mL | Freq: Once | INTRAVENOUS | Status: AC | PRN
  Administered 2017-06-14: 75 mL via INTRAVENOUS

## 2017-06-14 NOTE — Progress Notes (Signed)
[address]       [place] [address]             [phone]       HPI: Mrs. Garretson returns for scheduled follow-up visit.  Mrs. Marquess is an 84 year old woman with a past history of hypertension, hyperlipidemia, paroxysmal atrial fibrillation, tachybradycardia syndrome, pacemaker, reflux, and aspiration pneumonia.  In July 2018 she was hospitalized with pneumonia.  CT of the chest showed bilateral inferior and some upper lobe groundglass opacities.  This was felt to be due to an aspiration pneumonia.  She has been treated with several rounds of antibiotics.  A repeat CT in August showed a "4.5 cm" ascending aneurysm.  I saw her in consultation.  I felt like her aneurysm was more along the lines of 4.2 cm.  In the interim since her last visit she has been doing about the same.  She still has a cough.  She has shortness of breath at times.  She has not been having any fevers, chills, or sweats.  Past Medical History:  Diagnosis Date  . Allergic rhinitis, cause unspecified   . Family history of adverse reaction to anesthesia    all children - PONV  . Generalized osteoarthrosis, unspecified site   . GERD (gastroesophageal reflux disease)    pt. denies  . Hemorrhage of rectum and anus   . History of bronchitis   . HTN (hypertension)   . Hypercholesteremia    pt. denies  . Hypertonicity of bladder   . Pancreatitis, gallstone   . Persistent atrial fibrillation (HCC) paf   CHADS2VASC score is 5  . PONV (postoperative nausea and vomiting)   . Pulmonary HTN ([place])    Moderate with PASP 20mmHg on echo 11/2016   . Renal cell cancer ([place])    pt. denies  . Tachy-brady syndrome ([place])    s/p PPM  . Thoracic aortic aneurysm ([place])    55mm on echo 11/2016  . Urinary incontinence   . Wears glasses    reading  . Wears partial dentures    upper partial    Current Outpatient Medications  Medication Sig Dispense Refill  . amiodarone (PACERONE) 200 MG tablet TAKE ONE  TABLET BY MOUTH ONCE DAILY 90 tablet 2  . CARTIA XT 240 MG 24 hr capsule TAKE ONE CAPSULE BY MOUTH ONCE DAILY 90 capsule 3  . ELIQUIS 5 MG TABS tablet TAKE ONE TABLET BY MOUTH TWICE DAILY 180 tablet 3  . fluticasone (FLONASE) 50 MCG/ACT nasal spray Place 2 sprays into both nostrils daily. 16 g 5  . loratadine (CLARITIN) 10 MG tablet Take 10 mg by mouth daily as needed for allergies.    . metoprolol succinate (TOPROL-XL) 25 MG 24 hr tablet TAKE ONE TABLET BY MOUTH ONCE DAILY 90 tablet 3  . pantoprazole (PROTONIX) 40 MG tablet Take 1 tablet (40 mg total) by mouth 2 (two) times daily. 60 tablet 0  . polyethylene glycol (MIRALAX / GLYCOLAX) packet Take 17 g by mouth daily. 14 each 0   No current facility-administered medications for this visit.     Physical Exam BP 124/75   Pulse 96   Resp 20   Ht 5\' 5"  (1.651 m)   Wt 165 lb (74.8 kg)   SpO2 92% Comment: RA  BMI 27.56 kg/m  84 year old woman in no acute distress Alert and oriented x3 with no focal deficits Lungs diminished at right base, otherwise clear Cardiac regular rate and  rhythm 1+ peripheral edema  Diagnostic Tests: CT ANGIOGRAPHY CHEST WITH CONTRAST  TECHNIQUE: Multidetector CT imaging of the chest was performed using the standard protocol during bolus administration of intravenous contrast. Multiplanar CT image reconstructions and MIPs were obtained to evaluate the vascular anatomy.  CONTRAST:  33mL ISOVUE-370 IOPAMIDOL (ISOVUE-370) INJECTION 76%  COMPARISON:  10/02/2015  FINDINGS: Cardiovascular: The heart size is enlarged. There is aortic atherosclerosis. There is a left chest wall pacer device with lead in the right atrial appendage and right ventricle.  The ascending thoracic aorta is stable measuring 4.2 cm, image 230/5. The anterior arch measures 3.2 cm, image 95/9. Previously 3.1 cm. The posterior arch measures 2.8 cm, image 98/9. Previously 2.6 cm. At the level of the hiatus the aorta measures 2.9 cm,  image 430/5. Previously 2.8 cm. The main pulmonary artery measures 3.5 cm.  Calcification within the LAD coronary artery noted.  Mediastinum/Nodes: Small nodule in the thyroid gland on the right measures 6 mm, image 18/5. The trachea appears patent and is midline. Unremarkable appearance of the esophagus. No enlarged mediastinal or hilar lymph nodes.  Lungs/Pleura: No pleural effusion. Airspace consolidation within the posterior right lower lobe is identified which has progressed from previous exam. Progressive airspace consolidation within the lateral left lung base also noted. Bronchiectasis and diffuse interstitial thickening is identified is identified in both lung bases. Subpleural nodule within the right upper lobe measures 5 mm, image 60/11. This is new compared with 04/27/2017.  Upper Abdomen: No acute abnormality identified. Liver cysts are again noted. Progressive biliary dilatation is identified in this patient who is status post cholecystectomy. The common bile duct measures 1 cm proximally.  Musculoskeletal: No suspicious bone lesions.  Review of the MIP images confirms the above findings.  IMPRESSION: 1. No significant change in the appearance of ascending thoracic aortic aneurysm measuring 4.2 cm. Recommend annual imaging followup by CTA or MRA. This recommendation follows 2010 ACCF/AHA/AATS/ACR/ASA/SCA/SCAI/SIR/STS/SVM Guidelines for the Diagnosis and Management of Patients with Thoracic Aortic Disease. Circulation. 2010; 121: Z610-R604 2. Increase caliber of the main pulmonary artery which may reflect PA hypertension. 3. Aortic Atherosclerosis (ICD10-I70.0). Lad coronary artery calcifications noted. 4. Persistent and progressive airspace consolidation with the in bilateral lower lobes compatible with chronic pneumonia and/or aspiration. 5. Subpleural nodule in the right upper lobe measures 5 mm and is new from 04/27/2017. No follow-up needed if  patient is low-risk. Non-contrast chest CT can be considered in 12 months if patient is high-risk. This recommendation follows the consensus statement: Guidelines for Management of Incidental Pulmonary Nodules Detected on CT Images: From the Fleischner Society 2017; Radiology 2017; 284:228-243.   Electronically Signed   By: Kerby Moors M.D.   On: 06/14/2017 10:41 I personally reviewed the CT images and concur with the findings noted above  Impression: Mrs. Baus is an 84 year old woman with multiple medical problems including a thoracic aortic aneurysm, hypertension, pulmonary hypertension, paroxysmal atrial fibrillation, reflux, and aspiration.  She was noted to have an ascending aneurysm on CT scan last summer.  By official report it was 4.5 cm but on careful examination appeared to be more along the lines of 4.2 cm.  Her repeat scan today shows no change at 4.2 cm.  She needs another scan in 1 year.  Persistent consolidation at right lung base.  Originally found in the context of aspiration pneumonia.  It is concerning that this has not resolved.  It could be something along the lines of BOOP.  I will see what Dr.  Byrum thinks about that.  Hypertension-blood pressure well controlled on current regimen  Plan: Return in 1 year with CT chest  Melrose Nakayama, MD [facility] [phone]

## 2017-06-14 NOTE — Telephone Encounter (Signed)
lmtcb x1 for pt. 

## 2017-06-14 NOTE — Telephone Encounter (Signed)
Ria Comment, Can you make sure Ms. Blok has a follow up with Dr. Lamonte Sakai or me? She has Persistent and progressive airspace consolidation with the in bilateral lower lobes compatible with chronic pneumonia and/or aspiration per CT that was done to monitor her  Aneurysm.  Thanks so much

## 2017-06-16 ENCOUNTER — Encounter: Payer: Self-pay | Admitting: Cardiology

## 2017-06-16 ENCOUNTER — Ambulatory Visit (INDEPENDENT_AMBULATORY_CARE_PROVIDER_SITE_OTHER): Payer: Medicare Other | Admitting: Cardiology

## 2017-06-16 VITALS — BP 114/70 | HR 83 | Ht 65.0 in | Wt 164.8 lb

## 2017-06-16 DIAGNOSIS — I272 Pulmonary hypertension, unspecified: Secondary | ICD-10-CM

## 2017-06-16 DIAGNOSIS — I712 Thoracic aortic aneurysm, without rupture, unspecified: Secondary | ICD-10-CM

## 2017-06-16 DIAGNOSIS — I495 Sick sinus syndrome: Secondary | ICD-10-CM | POA: Diagnosis not present

## 2017-06-16 DIAGNOSIS — R9389 Abnormal findings on diagnostic imaging of other specified body structures: Secondary | ICD-10-CM | POA: Diagnosis not present

## 2017-06-16 DIAGNOSIS — I481 Persistent atrial fibrillation: Secondary | ICD-10-CM

## 2017-06-16 DIAGNOSIS — I2510 Atherosclerotic heart disease of native coronary artery without angina pectoris: Secondary | ICD-10-CM | POA: Diagnosis not present

## 2017-06-16 DIAGNOSIS — I4819 Other persistent atrial fibrillation: Secondary | ICD-10-CM

## 2017-06-16 DIAGNOSIS — I10 Essential (primary) hypertension: Secondary | ICD-10-CM | POA: Diagnosis not present

## 2017-06-16 HISTORY — DX: Atherosclerotic heart disease of native coronary artery without angina pectoris: I25.10

## 2017-06-16 NOTE — Progress Notes (Signed)
Cardiology Office Note:    Date:  06/16/2017   ID:  Molly Taylor, DOB [birth year], MRN [id number]  PCP:  Aurea Graff.Marlou Sa, MD  Cardiologist:  No primary care provider on file.    Referring MD: Alroy Dust, L.Marlou Sa, MD   No chief complaint on file.   History of Present Illness:    Molly Taylor is a 84 y.o. female with a hx of HTN, persistent atrial fibrillation, tachybrady syndrome s/p PPM and chronic systemic anticoagulation.  She is here today for followup and is doing well.  She denies any chest pain or pressure,  PND, orthopnea,  dizziness, palpitations or syncope. She has chronic DOE which is stable.  She has had some LE recently in the right foot after breaking her toe.  She occasionally has some LLE at times.  She is compliant with her meds and is tolerating meds with no SE.     Past Medical History:  Diagnosis Date  . Allergic rhinitis, cause unspecified   . Coronary artery calcification seen on CAT scan 06/16/2017   Calcifcations noted in LAD.  Chest CT angio 05/2017  . Family history of adverse reaction to anesthesia    all children - PONV  . Generalized osteoarthrosis, unspecified site   . GERD (gastroesophageal reflux disease)    pt. denies  . Hemorrhage of rectum and anus   . History of bronchitis   . HTN (hypertension)   . Hypercholesteremia    pt. denies  . Hypertonicity of bladder   . Pancreatitis, gallstone   . Persistent atrial fibrillation (HCC) paf   CHADS2VASC score is 5  . PONV (postoperative nausea and vomiting)   . Pulmonary HTN ([place])    Moderate with PASP 32mmHg on echo 11/2016   . Renal cell cancer ([place])    pt. denies  . Tachy-brady syndrome ([place])    s/p PPM  . Thoracic aortic aneurysm ([place])    71mm on echo 11/2016, 4.2cm by chest CT angio 05/2017  . Urinary incontinence   . Wears glasses    reading  . Wears partial dentures    upper partial    Past Surgical History:  Procedure Laterality Date  . ABDOMINAL HYSTERECTOMY    . COLONOSCOPY     . LAPAROSCOPIC CHOLECYSTECTOMY  2007   lap choli  . left knee replacement - Dr. French Ana  2012   lt total knee  . PACEMAKER GENERATOR CHANGE  04/2013   MDT ADDRL1 pacemaker implanted by Dr Lovena Le  . PACEMAKER INSERTION  2007  . PERMANENT PACEMAKER GENERATOR CHANGE N/A 05/03/2013   Procedure: PERMANENT PACEMAKER GENERATOR CHANGE;  Surgeon: Evans Lance, MD;  Location: [facility] CATH LAB;  Service: Cardiovascular;  Laterality: N/A;  . PROXIMAL INTERPHALANGEAL FUSION (PIP) Right 08/22/2013   Procedure: EXCISION MUCOID CYST DEBRIDEMENT PROXIMAL INTERPHALANGEAL JOINT;  Surgeon: Wynonia Sours, MD;  Location: [place];  Service: Orthopedics;  Laterality: Right;  . THUMB ARTHROSCOPY  2007   right  . TOTAL ABDOMINAL HYSTERECTOMY W/ BILATERAL SALPINGOOPHORECTOMY    . TOTAL KNEE ARTHROPLASTY Right 10/03/2015   Procedure: RIGHT TOTAL KNEE ARTHROPLASTY;  Surgeon: Earlie Server, MD;  Location: [place];  Service: Orthopedics;  Laterality: Right;  . TUBAL LIGATION    . VEIN LIGATION      Current Medications: Current Meds  Medication Sig  . amiodarone (PACERONE) 200 MG tablet TAKE ONE TABLET BY MOUTH ONCE DAILY  . CARTIA XT 240 MG 24 hr capsule TAKE ONE CAPSULE BY MOUTH ONCE  DAILY  . ELIQUIS 5 MG TABS tablet TAKE ONE TABLET BY MOUTH TWICE DAILY  . fluticasone (FLONASE) 50 MCG/ACT nasal spray Place 2 sprays into both nostrils daily.  Marland Kitchen loratadine (CLARITIN) 10 MG tablet Take 10 mg by mouth daily as needed for allergies.  . metoprolol succinate (TOPROL-XL) 25 MG 24 hr tablet TAKE ONE TABLET BY MOUTH ONCE DAILY  . pantoprazole (PROTONIX) 40 MG tablet Take 1 tablet (40 mg total) by mouth 2 (two) times daily.  . polyethylene glycol (MIRALAX / GLYCOLAX) packet Take 17 g by mouth daily.     Allergies:   Hydrocodone; Sulfa antibiotics; and Codeine   Social History   Socioeconomic History  . Marital status: Married    Spouse name: Not on file  . Number of children: Not on file  . Years of  education: Not on file  . Highest education level: Not on file  Occupational History  . Not on file  Social Needs  . Financial resource strain: Not on file  . Food insecurity:    Worry: Not on file    Inability: Not on file  . Transportation needs:    Medical: Not on file    Non-medical: Not on file  Tobacco Use  . Smoking status: Never Smoker  . Smokeless tobacco: Never Used  Substance and Sexual Activity  . Alcohol use: No  . Drug use: No  . Sexual activity: Not on file  Lifestyle  . Physical activity:    Days per week: Not on file    Minutes per session: Not on file  . Stress: Not on file  Relationships  . Social connections:    Talks on phone: Not on file    Gets together: Not on file    Attends religious service: Not on file    Active member of club or organization: Not on file    Attends meetings of clubs or organizations: Not on file    Relationship status: Not on file  Other Topics Concern  . Not on file  Social History Narrative  . Not on file     Family History: The patient's family history includes Hypertension in her other.  ROS:   Please see the history of present illness.    ROS  All other systems reviewed and negative.   EKGs/Labs/Other Studies Reviewed:    The following studies were reviewed today: none  EKG:  EKG is ordered today and showed V paced rhythm  Recent Labs: 09/19/2016: ALT 13; B Natriuretic Peptide 124.1 09/22/2016: BUN 11; Creatinine, Ser 0.69; Potassium 3.9; Sodium 138 09/23/2016: Hemoglobin 11.9; Platelets 181   Recent Lipid Panel No results found for: CHOL, TRIG, HDL, CHOLHDL, VLDL, LDLCALC, LDLDIRECT  Physical Exam:    VS:  BP 114/70   Pulse 83   Ht 5\' 5"  (1.651 m)   Wt 164 lb 12.8 oz (74.8 kg)   BMI 27.42 kg/m     Wt Readings from Last 3 Encounters:  06/16/17 164 lb 12.8 oz (74.8 kg)  06/14/17 165 lb (74.8 kg)  02/01/17 165 lb (74.8 kg)     GEN:  Well nourished, well developed in no acute distress HEENT:  Normal NECK: No JVD; No carotid bruits LYMPHATICS: No lymphadenopathy CARDIAC: RRR, no murmurs, rubs, gallops RESPIRATORY:  Clear to auscultation without rales, wheezing or rhonchi  ABDOMEN: Soft, non-tender, non-distended MUSCULOSKELETAL:  No edema; No deformity  SKIN: Warm and dry NEUROLOGIC:  Alert and oriented x 3 PSYCHIATRIC:  Normal affect  ASSESSMENT:    1. Persistent atrial fibrillation ([place])   2. Essential hypertension   3. Tachy-brady syndrome ([place])   4. Thoracic aortic aneurysm without rupture ([place])   5. Pulmonary HTN ([place])   6. Coronary artery calcification seen on CAT scan   7. Abnormal CT scan, chest    PLAN:    In order of problems listed above:  1.  Persistent atrial fibrillation - she is maintaining normal sinus rhythm on exam today.  She will continue on amiodarone 200 mg daily, Cartia 240 mg daily and Toprol 25 mg daily.  She will also continue on Eliquis 5 mg twice daily for CHADS2VASC score of 5.  Her creatinine was normal at 0.9 on 01/17/2017 and hemoglobin 12.7 on 09/28/2016.  I will check a BMET and CBC today.  2.  HTN -blood pressures well controlled on exam today.  She will continue on Cartia XT 240 mg daily and Toprol-XL 25 mg daily.  3.  Tachy-brady syndrome -status post permanent pacemaker and followed in our device clinic.  4.  Thoracic aortic aneurysm -her blood pressure is well controlled on exam today.  She will continue on statin therapy.  2D echocardiogram 12/16/2006 showed aortic diameter 4.1 cm.  CT Angio of the chest showed stable ascending thoracic aortic aneurysm at 4.2 cm.  5.  Pulmonary HTN - 2D echocardiogram 12/15/2016 showed moderate pulmonary hypertension with PA systolic pressure 43 mmHg.  I will repeat 2D echocardiogram 11/2017 to make sure this is stable.  6.  Coronary artery calcifications noted in the LAD on chest CT angio 06/14/2017.  She will continue on statin therapy.  She denies any anginal chest pain.  7.  Chronic PNA on Chest  CT - she is seeing Dr. Lamonte Sakai next month and will need to address findings on Chest CT since she is on Amio.    Medication Adjustments/Labs and Tests Ordered: Current medicines are reviewed at length with the patient today.  Concerns regarding medicines are outlined above.  No orders of the defined types were placed in this encounter.  No orders of the defined types were placed in this encounter.   Signed, Fransico Him, MD  06/16/2017 11:44 AM    [place]

## 2017-06-16 NOTE — Patient Instructions (Addendum)
Medication Instructions:  Your physician recommends that you continue on your current medications as directed. Please refer to the Current Medication list given to you today.  Labwork: Today for kidney function test and complete blood count  Testing/Procedures: Your physician has requested that you keep your appointment for echo on 12/15/17. Echocardiography is a painless test that uses sound waves to create images of your heart. It provides your doctor with information about the size and shape of your heart and how well your heart's chambers and valves are working. This procedure takes approximately one hour. There are no restrictions for this procedure.   Follow-Up: Your physician recommends that you schedule a follow-up appointment next week with Dr. Lamonte Sakai due to abnormal CT  Your physician wants you to follow-up in: 6 months with Dr. Radford Pax. You will receive a reminder letter in the mail two months in advance. If you don't receive a letter, please call our office to schedule the follow-up appointment.  Any Other Special Instructions Will Be Listed Below (If Applicable).    Thank you for choosing [facility], RN  [phone]  If you need a refill on your cardiac medications before your next appointment, please call your pharmacy.

## 2017-06-17 LAB — CBC
HEMATOCRIT: 34.4 % (ref 34.0–46.6)
Hemoglobin: 11.6 g/dL (ref 11.1–15.9)
MCH: 31.7 pg (ref 26.6–33.0)
MCHC: 33.7 g/dL (ref 31.5–35.7)
MCV: 94 fL (ref 79–97)
PLATELETS: 154 10*3/uL (ref 150–379)
RBC: 3.66 x10E6/uL — ABNORMAL LOW (ref 3.77–5.28)
RDW: 16.4 % — AB (ref 12.3–15.4)
WBC: 5.5 10*3/uL (ref 3.4–10.8)

## 2017-06-17 LAB — BASIC METABOLIC PANEL
BUN / CREAT RATIO: 17 (ref 12–28)
BUN: 16 mg/dL (ref 8–27)
CO2: 22 mmol/L (ref 20–29)
CREATININE: 0.94 mg/dL (ref 0.57–1.00)
Calcium: 9.1 mg/dL (ref 8.7–10.3)
Chloride: 110 mmol/L — ABNORMAL HIGH (ref 96–106)
GFR, EST AFRICAN AMERICAN: 63 mL/min/{1.73_m2} (ref >59)
GFR, EST NON AFRICAN AMERICAN: 54 mL/min/{1.73_m2} — AB (ref >59)
GLUCOSE: 84 mg/dL (ref 65–99)
Potassium: 3.9 mmol/L (ref 3.5–5.2)
SODIUM: 147 mmol/L — AB (ref 134–144)

## 2017-06-21 ENCOUNTER — Other Ambulatory Visit (INDEPENDENT_AMBULATORY_CARE_PROVIDER_SITE_OTHER): Payer: Medicare Other

## 2017-06-21 ENCOUNTER — Encounter: Payer: Self-pay | Admitting: Adult Health

## 2017-06-21 ENCOUNTER — Ambulatory Visit (INDEPENDENT_AMBULATORY_CARE_PROVIDER_SITE_OTHER): Payer: Medicare Other | Admitting: Adult Health

## 2017-06-21 VITALS — BP 122/68 | HR 82 | Ht 65.0 in | Wt 160.8 lb

## 2017-06-21 DIAGNOSIS — J189 Pneumonia, unspecified organism: Secondary | ICD-10-CM

## 2017-06-21 DIAGNOSIS — I481 Persistent atrial fibrillation: Secondary | ICD-10-CM | POA: Diagnosis not present

## 2017-06-21 DIAGNOSIS — J690 Pneumonitis due to inhalation of food and vomit: Secondary | ICD-10-CM | POA: Diagnosis not present

## 2017-06-21 DIAGNOSIS — I272 Pulmonary hypertension, unspecified: Secondary | ICD-10-CM | POA: Diagnosis not present

## 2017-06-21 DIAGNOSIS — I4819 Other persistent atrial fibrillation: Secondary | ICD-10-CM

## 2017-06-21 DIAGNOSIS — I2510 Atherosclerotic heart disease of native coronary artery without angina pectoris: Secondary | ICD-10-CM | POA: Diagnosis not present

## 2017-06-21 LAB — SEDIMENTATION RATE: SED RATE: 20 mm/h (ref 0–30)

## 2017-06-21 MED ORDER — AMOXICILLIN-POT CLAVULANATE 875-125 MG PO TABS: 1.0000 | ORAL_TABLET | Freq: Two times a day (BID) | ORAL | 0 refills | Status: AC

## 2017-06-21 NOTE — Assessment & Plan Note (Signed)
Pt appears stable . On Chroinic Amiodarone use . ESR is normal and presentation does not fit with amiodarone toxicity . Will check spirometry with DLCO on return . If A Fib could be controlled off Amiodarone that would be an option if DLCO is trending down , will leave to Cardiology .

## 2017-06-21 NOTE — Progress Notes (Signed)
Called spoke with patient, advised of lab results / recs as stated by TP.  Pt verbalized her understanding and denied any questions. 

## 2017-06-21 NOTE — Assessment & Plan Note (Addendum)
CT scan reviewed with Dr. Byrum  . Suspect LPR/Aspiration is playing a role in this pt with recurrent areas of consolidation R>L .  Recent sinus infection and nocturnal coughing episodes may indicate recent acute infection  Will treat with Augumentin , follow up CXR on return  Aggressive GERD tx .  ESR is low today and picture does not fit with Amiodarone toxicity . Will watch closely .  Have her return in 6 weeks and decide on next timing for CT chest if inidicated.  May need FOB , but would like to hold off if possible as pt age and on Eliquis makes her high risk for potential complications.   Plan Patient Instructions  Begin Augmentin 875mg Twice daily  For 1 week, take with food  Mucinex DM Twice daily  As needed  Cough/congestion  Labs today .  GERD diet  Continue on Protonix 40mg Twice daily   Follow up with Dr. Byrum  In 6 weeks with PFT and As needed   Please contact office for sooner follow up if symptoms do not improve or worsen or seek emergency care       

## 2017-06-21 NOTE — Patient Instructions (Addendum)
Begin Augmentin 875mg  Twice daily  For 1 week, take with food  Mucinex DM Twice daily  As needed  Cough/congestion  Labs today .  GERD diet  Continue on Protonix 40mg  Twice daily   Follow up with Dr. Lamonte Sakai  In 6 weeks with PFT and As needed   Please contact office for sooner follow up if symptoms do not improve or worsen or seek emergency care

## 2017-06-21 NOTE — Progress Notes (Signed)
@Patient  ID: Molly Taylor, female    DOB: Oct 08, 1926, 84 y.o.   MRN: [id number]  Chief Complaint  Patient presents with  . Follow-up    Abnormal CT     Referring provider: Alroy Dust, L.Marlou Sa, MD  HPI: 84 year old female never smoker followed for suspected aspiration pneumonia secondary to reflux with abnormal CT chest Past medical history significant for A. fib, tachybradycardia syndrome status post pacemaker and chronic amiodarone use  TEST  CT chest July 2018 bilateral lower lobe patchy airspace consolidation, groundglass opacities in the upper lobes CT chest August 2018 proving bilateral lower lobe and lingular infiltrates CT chest March 2019 shows persistent and progressive airspace consolidation in the bilateral lower lobes , 5 mm right upper lobe nodule  06/21/2017 Follow up ; Abnormal CT chest /aspiration pneumonia Patient returns for a follow-up.  Patient was admitted July 2018 with a multi lobar pneumonia felt secondary to LPR and aspiration CT chest showed bibasilar consolidation.  CT chest was repeated after antibiotics in August 2018 that showed improvement in her pulmonary infiltrates.  She was treated for aggressive reflux prevention.  She has had serial follow-up of her abnormal CT.  That is showed persistent areas most recent CT chest March 2019 showed persistent and progressive airspace consolidation in the bilateral lower lobes..  Patient is feeling okay ,  But says she occasionally get bad coughing fit in middle of night .  This happended few nights ago.  Also had bad sinus infection in Jan and Feb  ,requiring 3 different abx.  CBC last week showed normal WBC.   She is followed by cardiology for A. fib and tachybradycardia syndrome on chronic amiodarone use. She is on Eliquis.  Husband passed away last week. Support provided.    Allergies  Allergen Reactions  . Hydrocodone     Excessive vomiting  . Sulfa Antibiotics Swelling  . Codeine Nausea And Vomiting      Immunization History  Administered Date(s) Administered  . Influenza, High Dose Seasonal PF 01/11/2017  . Influenza,inj,Quad PF,6+ Mos 01/16/2016  . PPD Test 10/06/2015    Past Medical History:  Diagnosis Date  . Allergic rhinitis, cause unspecified   . Coronary artery calcification seen on CAT scan 06/16/2017   Calcifcations noted in LAD.  Chest CT angio 05/2017  . Family history of adverse reaction to anesthesia    all children - PONV  . Generalized osteoarthrosis, unspecified site   . GERD (gastroesophageal reflux disease)    pt. denies  . Hemorrhage of rectum and anus   . History of bronchitis   . HTN (hypertension)   . Hypercholesteremia    pt. denies  . Hypertonicity of bladder   . Pancreatitis, gallstone   . Persistent atrial fibrillation (HCC) paf   CHADS2VASC score is 5  . PONV (postoperative nausea and vomiting)   . Pulmonary HTN ([place])    Moderate with PASP 60mHg on echo 11/2016   . Renal cell cancer ([place]    pt. denies  . Tachy-brady syndrome ([facility]    s/p PPM  . Thoracic aortic aneurysm ([facility]    484mon echo 11/2016, 4.2cm by chest CT angio 05/2017  . Urinary incontinence   . Wears glasses    reading  . Wears partial dentures    upper partial    Tobacco History: Social History   Tobacco Use  Smoking Status Never Smoker  Smokeless Tobacco Never Used   Counseling given: Not Answered   Outpatient Encounter Medications as of  06/21/2017  Medication Sig  . amiodarone (PACERONE) 200 MG tablet TAKE ONE TABLET BY MOUTH ONCE DAILY  . CARTIA XT 240 MG 24 hr capsule TAKE ONE CAPSULE BY MOUTH ONCE DAILY  . ELIQUIS 5 MG TABS tablet TAKE ONE TABLET BY MOUTH TWICE DAILY  . fluticasone (FLONASE) 50 MCG/ACT nasal spray Place 2 sprays into both nostrils daily.  Marland Kitchen loratadine (CLARITIN) 10 MG tablet Take 10 mg by mouth daily as needed for allergies.  . metoprolol succinate (TOPROL-XL) 25 MG 24 hr tablet TAKE ONE TABLET BY MOUTH ONCE DAILY  . OVER THE COUNTER  MEDICATION Take 1 tablet by mouth daily. codliver supplement takes daily  . pantoprazole (PROTONIX) 40 MG tablet Take 1 tablet (40 mg total) by mouth 2 (two) times daily.  . polyethylene glycol (MIRALAX / GLYCOLAX) packet Take 17 g by mouth daily.  Marland Kitchen amoxicillin-clavulanate (AUGMENTIN) 875-125 MG tablet Take 1 tablet by mouth 2 (two) times daily for 7 days.   No facility-administered encounter medications on file as of 06/21/2017.      Review of Systems  Constitutional:   No  weight loss, night sweats,  Fevers, chills, fatigue, or  lassitude.  HEENT:   No headaches,  Difficulty swallowing,  Tooth/dental problems, or  Sore throat,                No sneezing, itching, ear ache, + nasal congestion, post nasal drip,   CV:  No chest pain,  Orthopnea, PND, swelling in lower extremities, anasarca, dizziness, palpitations, syncope.   GI  No heartburn, indigestion, abdominal pain, nausea, vomiting, diarrhea, change in bowel habits, loss of appetite, bloody stools.   Resp:    No chest wall deformity  Skin: no rash or lesions.  GU: no dysuria, change in color of urine, no urgency or frequency.  No flank pain, no hematuria   MS:  No joint pain or swelling.  No decreased range of motion.  No back pain.    Physical Exam  BP 122/68 (BP Location: Left Arm, Cuff Size: Normal)   Pulse 82   Ht 5' 5"  (1.651 m)   Wt 160 lb 12.8 oz (72.9 kg)   SpO2 97%   BMI 26.76 kg/m   GEN: A/Ox3; pleasant , NAD, well nourished    HEENT:  [place]/AT,  EACs-clear, TMs-wnl, NOSE-clear, THROAT-clear, no lesions, no postnasal drip or exudate noted.   NECK:  Supple w/ fair ROM; no JVD; normal carotid impulses w/o bruits; no thyromegaly or nodules palpated; no lymphadenopathy.    RESP  Decreased BS in bases  no accessory muscle use, no dullness to percussion  CARD:  RRR, no m/r/g, no peripheral edema, pulses intact, no cyanosis or clubbing.  GI:   Soft & nt; nml bowel sounds; no organomegaly or masses detected.    Musco: Warm bil, no deformities or joint swelling noted.   Neuro: alert, no focal deficits noted.    Skin: Warm, no lesions or rashes    Lab Results:  CBC    Component Value Date/Time   WBC 5.5 06/16/2017 1225   WBC 7.3 09/23/2016 0825   RBC 3.66 (L) 06/16/2017 1225   RBC 3.76 (L) 09/23/2016 0825   HGB 11.6 06/16/2017 1225   HCT 34.4 06/16/2017 1225   PLT 154 06/16/2017 1225   MCV 94 06/16/2017 1225   MCH 31.7 06/16/2017 1225   MCH 31.6 09/23/2016 0825   MCHC 33.7 06/16/2017 1225   MCHC 35.3 09/23/2016 0825   RDW 16.4 (H)  06/16/2017 1225   LYMPHSABS 0.9 09/19/2016 2217   MONOABS 0.5 09/19/2016 2217   EOSABS 0.3 09/19/2016 2217   BASOSABS 0.0 09/19/2016 2217    BMET    Component Value Date/Time   NA 147 (H) 06/16/2017 1225   K 3.9 06/16/2017 1225   CL 110 (H) 06/16/2017 1225   CO2 22 06/16/2017 1225   GLUCOSE 84 06/16/2017 1225   GLUCOSE 137 (H) 09/22/2016 0858   BUN 16 06/16/2017 1225   CREATININE 0.94 06/16/2017 1225   CALCIUM 9.1 06/16/2017 1225   GFRNONAA 54 (L) 06/16/2017 1225   GFRAA 63 06/16/2017 1225    BNP    Component Value Date/Time   BNP 124.1 (H) 09/19/2016 2218    ProBNP    Component Value Date/Time   PROBNP 974.0 (H) 04/16/2013 1529    Imaging: Dg Chest 2 View  Result Date: 05/23/2017 CLINICAL DATA:  Follow-up pneumonia, history hypertension, GERD, atrial fibrillation, pulmonary hypertension, renal cell carcinoma EXAM: CHEST  2 VIEW COMPARISON:  CT chest 04/27/2017, chest radiographs 04/26/2017 FINDINGS: LEFT subclavian transvenous pacemaker leads project at RIGHT atrium and RIGHT ventricle, unchanged. Enlargement of cardiac silhouette with slight pulmonary vascular congestion. Atherosclerotic calcification and minimal tortuosity of thoracic aorta. Emphysematous changes with bibasilar atelectasis and question minimal LEFT basilar infiltrate. Slightly increased infiltrate RIGHT mid lung. No definite pleural effusion or pneumothorax. Bones  demineralized. IMPRESSION: Slightly increased RIGHT mid lung infiltrate in superior segment of RIGHT lower lobe. Bibasilar atelectasis and question minimal LEFT basilar infiltrate. Enlargement of cardiac silhouette with slight pulmonary vascular congestion post pacemaker. Electronically Signed   By: Lavonia Dana M.D.   On: 05/23/2017 12:59   Ct Angio Chest Aorta W/cm &/or Wo/cm  Result Date: 06/14/2017 CLINICAL DATA:  Followup thoracic aortic aneurysm. EXAM: CT ANGIOGRAPHY CHEST WITH CONTRAST TECHNIQUE: Multidetector CT imaging of the chest was performed using the standard protocol during bolus administration of intravenous contrast. Multiplanar CT image reconstructions and MIPs were obtained to evaluate the vascular anatomy. CONTRAST:  30m ISOVUE-370 IOPAMIDOL (ISOVUE-370) INJECTION 76% COMPARISON:  10/02/2015 FINDINGS: Cardiovascular: The heart size is enlarged. There is aortic atherosclerosis. There is a left chest wall pacer device with lead in the right atrial appendage and right ventricle. The ascending thoracic aorta is stable measuring 4.2 cm, image 230/5. The anterior arch measures 3.2 cm, image 95/9. Previously 3.1 cm. The posterior arch measures 2.8 cm, image 98/9. Previously 2.6 cm. At the level of the hiatus the aorta measures 2.9 cm, image 430/5. Previously 2.8 cm. The main pulmonary artery measures 3.5 cm. Calcification within the LAD coronary artery noted. Mediastinum/Nodes: Small nodule in the thyroid gland on the right measures 6 mm, image 18/5. The trachea appears patent and is midline. Unremarkable appearance of the esophagus. No enlarged mediastinal or hilar lymph nodes. Lungs/Pleura: No pleural effusion. Airspace consolidation within the posterior right lower lobe is identified which has progressed from previous exam. Progressive airspace consolidation within the lateral left lung base also noted. Bronchiectasis and diffuse interstitial thickening is identified is identified in both lung  bases. Subpleural nodule within the right upper lobe measures 5 mm, image 60/11. This is new compared with 04/27/2017. Upper Abdomen: No acute abnormality identified. Liver cysts are again noted. Progressive biliary dilatation is identified in this patient who is status post cholecystectomy. The common bile duct measures 1 cm proximally. Musculoskeletal: No suspicious bone lesions. Review of the MIP images confirms the above findings. IMPRESSION: 1. No significant change in the appearance of ascending thoracic aortic  aneurysm measuring 4.2 cm. Recommend annual imaging followup by CTA or MRA. This recommendation follows 2010 ACCF/AHA/AATS/ACR/ASA/SCA/SCAI/SIR/STS/SVM Guidelines for the Diagnosis and Management of Patients with Thoracic Aortic Disease. Circulation. 2010; 121: [id number] 2. Increase caliber of the main pulmonary artery which may reflect PA hypertension. 3. Aortic Atherosclerosis (ICD10-I70.0). Lad coronary artery calcifications noted. 4. Persistent and progressive airspace consolidation with the in bilateral lower lobes compatible with chronic pneumonia and/or aspiration. 5. Subpleural nodule in the right upper lobe measures 5 mm and is new from 04/27/2017. No follow-up needed if patient is low-risk. Non-contrast chest CT can be considered in 12 months if patient is high-risk. This recommendation follows the consensus statement: Guidelines for Management of Incidental Pulmonary Nodules Detected on CT Images: From the Fleischner Society 2017; Radiology 2017; 284:228-243. Electronically Signed   By: Kerby Moors M.D.   On: 06/14/2017 10:41     Assessment & Plan:   Persistent atrial fibrillation ([place]) Pt appears stable . On Chroinic Amiodarone use . ESR is normal and presentation does not fit with amiodarone toxicity . Will check spirometry with DLCO on return . If A Fib could be controlled off Amiodarone that would be an option if DLCO is trending down , will leave to Cardiology .     Aspiration pneumonia ([place]) CT scan reviewed with Dr. Lamonte Sakai  . Suspect LPR/Aspiration is playing a role in this pt with recurrent areas of consolidation R>L .  Recent sinus infection and nocturnal coughing episodes may indicate recent acute infection  Will treat with Augumentin , follow up CXR on return  Aggressive GERD tx .  ESR is low today and picture does not fit with Amiodarone toxicity . Will watch closely .  Have her return in 6 weeks and decide on next timing for CT chest if inidicated.  May need FOB , but would like to hold off if possible as pt age and on Eliquis makes her high risk for potential complications.   Plan Patient Instructions  Begin Augmentin 88m Twice daily  For 1 week, take with food  Mucinex DM Twice daily  As needed  Cough/congestion  Labs today .  GERD diet  Continue on Protonix 452mTwice daily   Follow up with Dr. ByLamonte SakaiIn 6 weeks with PFT and As needed   Please contact office for sooner follow up if symptoms do not improve or worsen or seek emergency care           TaRexene EdisonNP 06/21/2017

## 2017-06-22 ENCOUNTER — Other Ambulatory Visit: Payer: Self-pay | Admitting: *Deleted

## 2017-06-22 MED ORDER — APIXABAN 5 MG PO TABS: 5.0000 mg | ORAL_TABLET | Freq: Two times a day (BID) | ORAL | 3 refills | Status: AC

## 2017-06-27 ENCOUNTER — Ambulatory Visit (INDEPENDENT_AMBULATORY_CARE_PROVIDER_SITE_OTHER): Payer: Medicare Other | Admitting: *Deleted

## 2017-06-27 DIAGNOSIS — I495 Sick sinus syndrome: Secondary | ICD-10-CM | POA: Diagnosis not present

## 2017-06-28 NOTE — Progress Notes (Signed)
Remote pacemaker transmission.   

## 2017-06-30 ENCOUNTER — Encounter: Payer: Self-pay | Admitting: Cardiology

## 2017-07-04 DIAGNOSIS — S92414D Nondisplaced fracture of proximal phalanx of right great toe, subsequent encounter for fracture with routine healing: Secondary | ICD-10-CM | POA: Diagnosis not present

## 2017-07-06 DIAGNOSIS — J069 Acute upper respiratory infection, unspecified: Secondary | ICD-10-CM | POA: Diagnosis not present

## 2017-07-06 DIAGNOSIS — K219 Gastro-esophageal reflux disease without esophagitis: Secondary | ICD-10-CM | POA: Diagnosis not present

## 2017-07-07 ENCOUNTER — Inpatient Hospital Stay (HOSPITAL_BASED_OUTPATIENT_CLINIC_OR_DEPARTMENT_OTHER)
Admission: EM | Admit: 2017-07-07 | Discharge: 2017-07-20 | DRG: 871 | Disposition: E | Payer: Medicare Other | Attending: Internal Medicine | Admitting: Internal Medicine

## 2017-07-07 ENCOUNTER — Emergency Department (HOSPITAL_BASED_OUTPATIENT_CLINIC_OR_DEPARTMENT_OTHER): Payer: Medicare Other

## 2017-07-07 ENCOUNTER — Other Ambulatory Visit: Payer: Self-pay

## 2017-07-07 ENCOUNTER — Encounter (HOSPITAL_BASED_OUTPATIENT_CLINIC_OR_DEPARTMENT_OTHER): Payer: Self-pay | Admitting: *Deleted

## 2017-07-07 DIAGNOSIS — K625 Hemorrhage of anus and rectum: Secondary | ICD-10-CM

## 2017-07-07 DIAGNOSIS — K219 Gastro-esophageal reflux disease without esophagitis: Secondary | ICD-10-CM | POA: Diagnosis present

## 2017-07-07 DIAGNOSIS — R0902 Hypoxemia: Secondary | ICD-10-CM

## 2017-07-07 DIAGNOSIS — J189 Pneumonia, unspecified organism: Secondary | ICD-10-CM | POA: Diagnosis not present

## 2017-07-07 DIAGNOSIS — Z515 Encounter for palliative care: Secondary | ICD-10-CM | POA: Diagnosis not present

## 2017-07-07 DIAGNOSIS — F4321 Adjustment disorder with depressed mood: Secondary | ICD-10-CM | POA: Diagnosis not present

## 2017-07-07 DIAGNOSIS — I110 Hypertensive heart disease with heart failure: Secondary | ICD-10-CM | POA: Diagnosis present

## 2017-07-07 DIAGNOSIS — I272 Pulmonary hypertension, unspecified: Secondary | ICD-10-CM | POA: Diagnosis present

## 2017-07-07 DIAGNOSIS — G9340 Encephalopathy, unspecified: Secondary | ICD-10-CM

## 2017-07-07 DIAGNOSIS — A419 Sepsis, unspecified organism: Secondary | ICD-10-CM | POA: Diagnosis not present

## 2017-07-07 DIAGNOSIS — D649 Anemia, unspecified: Secondary | ICD-10-CM

## 2017-07-07 DIAGNOSIS — R6520 Severe sepsis without septic shock: Secondary | ICD-10-CM | POA: Diagnosis present

## 2017-07-07 DIAGNOSIS — K921 Melena: Secondary | ICD-10-CM | POA: Diagnosis not present

## 2017-07-07 DIAGNOSIS — I5033 Acute on chronic diastolic (congestive) heart failure: Secondary | ICD-10-CM | POA: Diagnosis present

## 2017-07-07 DIAGNOSIS — I480 Paroxysmal atrial fibrillation: Secondary | ICD-10-CM | POA: Diagnosis present

## 2017-07-07 DIAGNOSIS — Z973 Presence of spectacles and contact lenses: Secondary | ICD-10-CM

## 2017-07-07 DIAGNOSIS — Z7901 Long term (current) use of anticoagulants: Secondary | ICD-10-CM

## 2017-07-07 DIAGNOSIS — Z85528 Personal history of other malignant neoplasm of kidney: Secondary | ICD-10-CM

## 2017-07-07 DIAGNOSIS — Z7189 Other specified counseling: Secondary | ICD-10-CM

## 2017-07-07 DIAGNOSIS — J9601 Acute respiratory failure with hypoxia: Secondary | ICD-10-CM | POA: Diagnosis not present

## 2017-07-07 DIAGNOSIS — N281 Cyst of kidney, acquired: Secondary | ICD-10-CM | POA: Diagnosis present

## 2017-07-07 DIAGNOSIS — Z66 Do not resuscitate: Secondary | ICD-10-CM | POA: Diagnosis present

## 2017-07-07 DIAGNOSIS — E785 Hyperlipidemia, unspecified: Secondary | ICD-10-CM | POA: Diagnosis present

## 2017-07-07 DIAGNOSIS — J181 Lobar pneumonia, unspecified organism: Secondary | ICD-10-CM | POA: Diagnosis present

## 2017-07-07 DIAGNOSIS — D62 Acute posthemorrhagic anemia: Secondary | ICD-10-CM | POA: Diagnosis present

## 2017-07-07 DIAGNOSIS — N318 Other neuromuscular dysfunction of bladder: Secondary | ICD-10-CM | POA: Diagnosis present

## 2017-07-07 DIAGNOSIS — Z950 Presence of cardiac pacemaker: Secondary | ICD-10-CM

## 2017-07-07 DIAGNOSIS — Z9049 Acquired absence of other specified parts of digestive tract: Secondary | ICD-10-CM

## 2017-07-07 DIAGNOSIS — E871 Hypo-osmolality and hyponatremia: Secondary | ICD-10-CM | POA: Diagnosis present

## 2017-07-07 DIAGNOSIS — E7800 Pure hypercholesterolemia, unspecified: Secondary | ICD-10-CM | POA: Diagnosis present

## 2017-07-07 DIAGNOSIS — Z90710 Acquired absence of both cervix and uterus: Secondary | ICD-10-CM

## 2017-07-07 DIAGNOSIS — M159 Polyosteoarthritis, unspecified: Secondary | ICD-10-CM | POA: Diagnosis present

## 2017-07-07 DIAGNOSIS — Z96653 Presence of artificial knee joint, bilateral: Secondary | ICD-10-CM | POA: Diagnosis present

## 2017-07-07 DIAGNOSIS — Z9851 Tubal ligation status: Secondary | ICD-10-CM

## 2017-07-07 DIAGNOSIS — I481 Persistent atrial fibrillation: Secondary | ICD-10-CM | POA: Diagnosis present

## 2017-07-07 DIAGNOSIS — E876 Hypokalemia: Secondary | ICD-10-CM | POA: Diagnosis present

## 2017-07-07 DIAGNOSIS — Z8249 Family history of ischemic heart disease and other diseases of the circulatory system: Secondary | ICD-10-CM

## 2017-07-07 DIAGNOSIS — I712 Thoracic aortic aneurysm, without rupture: Secondary | ICD-10-CM | POA: Diagnosis present

## 2017-07-07 DIAGNOSIS — K769 Liver disease, unspecified: Secondary | ICD-10-CM | POA: Diagnosis present

## 2017-07-07 DIAGNOSIS — K922 Gastrointestinal hemorrhage, unspecified: Secondary | ICD-10-CM | POA: Diagnosis present

## 2017-07-07 DIAGNOSIS — I2510 Atherosclerotic heart disease of native coronary artery without angina pectoris: Secondary | ICD-10-CM | POA: Diagnosis present

## 2017-07-07 DIAGNOSIS — Z972 Presence of dental prosthetic device (complete) (partial): Secondary | ICD-10-CM

## 2017-07-07 DIAGNOSIS — R1084 Generalized abdominal pain: Secondary | ICD-10-CM | POA: Diagnosis not present

## 2017-07-07 DIAGNOSIS — Z9289 Personal history of other medical treatment: Secondary | ICD-10-CM

## 2017-07-07 LAB — COMPREHENSIVE METABOLIC PANEL
ALBUMIN: 3 g/dL — AB (ref 3.5–5.0)
ALK PHOS: 125 U/L (ref 38–126)
ALT: 23 U/L (ref 14–54)
ANION GAP: 11 (ref 5–15)
AST: 35 U/L (ref 15–41)
BILIRUBIN TOTAL: 1.1 mg/dL (ref 0.3–1.2)
BUN: 14 mg/dL (ref 6–20)
CO2: 22 mmol/L (ref 22–32)
Calcium: 9.1 mg/dL (ref 8.9–10.3)
Chloride: 105 mmol/L (ref 101–111)
Creatinine, Ser: 0.68 mg/dL (ref 0.44–1.00)
GFR calc Af Amer: >60 mL/min (ref >60)
GFR calc non Af Amer: >60 mL/min (ref >60)
GLUCOSE: 144 mg/dL — AB (ref 65–99)
Potassium: 2.8 mmol/L — ABNORMAL LOW (ref 3.5–5.1)
SODIUM: 138 mmol/L (ref 135–145)
TOTAL PROTEIN: 7.1 g/dL (ref 6.5–8.1)

## 2017-07-07 LAB — CBC
HCT: 32 % — ABNORMAL LOW (ref 36.0–46.0)
HEMOGLOBIN: 11.5 g/dL — AB (ref 12.0–15.0)
MCH: 31.8 pg (ref 26.0–34.0)
MCHC: 35.9 g/dL (ref 30.0–36.0)
MCV: 88.4 fL (ref 78.0–100.0)
Platelets: 228 10*3/uL (ref 150–400)
RBC: 3.62 MIL/uL — ABNORMAL LOW (ref 3.87–5.11)
RDW: 15.3 % (ref 11.5–15.5)
WBC: 7.1 10*3/uL (ref 4.0–10.5)

## 2017-07-07 MED ORDER — SODIUM CHLORIDE 0.9 % IV BOLUS
500.0000 mL | Freq: Once | INTRAVENOUS | Status: AC
  Administered 2017-07-08: 500 mL via INTRAVENOUS

## 2017-07-07 NOTE — ED Provider Notes (Signed)
[facility] EMERGENCY DEPARTMENT Provider Note   CSN: [id number] Arrival date & time: 07/13/2017  1945     History   Chief Complaint Chief Complaint  Patient presents with  . Rectal Bleeding    HPI Molly Taylor is a 84 y.o. female.  Patient here with family.  She said intermittent rectal bleeding for the past several weeks.  States her stool was initially black but then became brown again.  Now she is seeing bright red blood mixed with the stool and bright red blood when she wipes.  She denies any pain.  She has generalized weakness but no dizziness.  No chest pain or shortness of breath.  She has had increased fatigue.  She was seen in urgent care today and referred here.  She is on Eliquis for history of atrial fibrillation.  She has a pacemaker.  Denies any chest pain or shortness of breath.  Reports having a colonoscopy many years ago which was normal by her report.  The history is provided by the patient and a relative.    Past Medical History:  Diagnosis Date  . Allergic rhinitis, cause unspecified   . Coronary artery calcification seen on CAT scan 06/16/2017   Calcifcations noted in LAD.  Chest CT angio 05/2017  . Family history of adverse reaction to anesthesia    all children - PONV  . Generalized osteoarthrosis, unspecified site   . GERD (gastroesophageal reflux disease)    pt. denies  . Hemorrhage of rectum and anus   . History of bronchitis   . HTN (hypertension)   . Hypercholesteremia    pt. denies  . Hypertonicity of bladder   . Pancreatitis, gallstone   . Persistent atrial fibrillation (HCC) paf   CHADS2VASC score is 5  . PONV (postoperative nausea and vomiting)   . Pulmonary HTN ([place])    Moderate with PASP 26mmHg on echo 11/2016   . Renal cell cancer ([place])    pt. denies  . Tachy-brady syndrome ([place])    s/p PPM  . Thoracic aortic aneurysm ([place])    13mm on echo 11/2016, 4.2cm by chest CT angio 05/2017  . Urinary incontinence   . Wears glasses     reading  . Wears partial dentures    upper partial    Patient Active Problem List   Diagnosis Date Noted  . Aspiration pneumonia ([place]) 06/21/2017  . Coronary artery calcification seen on CAT scan 06/16/2017  . Abnormal CT scan, chest 06/16/2017  . Nocturnal cough 01/04/2017  . Pulmonary HTN ([place])   . Thoracic aortic aneurysm ([place]) 11/01/2016  . Primary localized osteoarthritis of right knee 10/03/2015  . Pacemaker 04/24/2013  . SOB (shortness of breath) 04/16/2013  . Encounter for therapeutic drug monitoring 04/12/2013  . Tachy-brady syndrome ([place]) 02/02/2013  . HTN (hypertension)   . Chronic anticoagulation   . Pancreatitis, gallstone   . Persistent atrial fibrillation ([place]) 01/18/2013    Past Surgical History:  Procedure Laterality Date  . ABDOMINAL HYSTERECTOMY    . COLONOSCOPY    . LAPAROSCOPIC CHOLECYSTECTOMY  2007   lap choli  . left knee replacement - Dr. French Ana  2012   lt total knee  . PACEMAKER GENERATOR CHANGE  04/2013   MDT ADDRL1 pacemaker implanted by Dr Lovena Le  . PACEMAKER INSERTION  2007  . PERMANENT PACEMAKER GENERATOR CHANGE N/A 05/03/2013   Procedure: PERMANENT PACEMAKER GENERATOR CHANGE;  Surgeon: Evans Lance, MD;  Location: [facility] CATH LAB;  Service: Cardiovascular;  Laterality: N/A;  . PROXIMAL INTERPHALANGEAL FUSION (PIP) Right 08/22/2013   Procedure: EXCISION MUCOID CYST DEBRIDEMENT PROXIMAL INTERPHALANGEAL JOINT;  Surgeon: Wynonia Sours, MD;  Location: [facility];  Service: Orthopedics;  Laterality: Right;  . THUMB ARTHROSCOPY  2007   right  . TOTAL ABDOMINAL HYSTERECTOMY W/ BILATERAL SALPINGOOPHORECTOMY    . TOTAL KNEE ARTHROPLASTY Right 10/03/2015   Procedure: RIGHT TOTAL KNEE ARTHROPLASTY;  Surgeon: Earlie Server, MD;  Location: [facility];  Service: Orthopedics;  Laterality: Right;  . TUBAL LIGATION    . VEIN LIGATION       OB History   None      Home Medications    Prior to Admission medications   Medication Sig Start Date  End Date Taking? Authorizing Provider  amiodarone (PACERONE) 200 MG tablet TAKE ONE TABLET BY MOUTH ONCE DAILY 12/01/16   Evans Lance, MD  apixaban (ELIQUIS) 5 MG TABS tablet Take 1 tablet (5 mg total) by mouth 2 (two) times daily. 06/22/17   Evans Lance, MD  CARTIA XT 240 MG 24 hr capsule TAKE ONE CAPSULE BY MOUTH ONCE DAILY 12/31/16   Sueanne Margarita, MD  fluticasone (FLONASE) 50 MCG/ACT nasal spray Place 2 sprays into both nostrils daily. 01/04/17   Collene Gobble, MD  loratadine (CLARITIN) 10 MG tablet Take 10 mg by mouth daily as needed for allergies.    [provider]  metoprolol succinate (TOPROL-XL) 25 MG 24 hr tablet TAKE ONE TABLET BY MOUTH ONCE DAILY 12/31/16   Sueanne Margarita, MD  OVER THE COUNTER MEDICATION Take 1 tablet by mouth daily. codliver supplement takes daily 05/21/17   [provider]  pantoprazole (PROTONIX) 40 MG tablet Take 1 tablet (40 mg total) by mouth 2 (two) times daily. 09/24/16   Regalado, Belkys A, MD  polyethylene glycol (MIRALAX / GLYCOLAX) packet Take 17 g by mouth daily. 09/25/16   Regalado, Cassie Freer, MD    Family History Family History  Problem Relation Age of Onset  . Hypertension Other     Social History Social History   Tobacco Use  . Smoking status: Never Smoker  . Smokeless tobacco: Never Used  Substance Use Topics  . Alcohol use: No  . Drug use: No     Allergies   Hydrocodone; Sulfa antibiotics; and Codeine   Review of Systems Review of Systems  Constitutional: Positive for activity change, appetite change and fatigue. Negative for fever.  HENT: Negative for congestion and nosebleeds.   Eyes: Negative for visual disturbance.  Respiratory: Negative for cough, chest tightness and shortness of breath.   Cardiovascular: Negative for chest pain.  Gastrointestinal: Positive for blood in stool. Negative for abdominal pain, nausea and vomiting.  Genitourinary: Negative for dysuria, hematuria, vaginal bleeding and  vaginal discharge.  Neurological: Positive for dizziness, weakness and light-headedness. Negative for headaches.   all other systems are negative except as noted in the HPI and PMH.     Physical Exam Updated Vital Signs BP 130/66   Pulse 79   Temp 98.3 F (36.8 C) (Oral)   Resp 20   Ht 5\' 5"  (1.651 m)   Wt 72.6 kg (160 lb)   SpO2 93%   BMI 26.63 kg/m   Physical Exam  Constitutional: She is oriented to person, place, and time. She appears well-developed and well-nourished. No distress.  Fatigued appearing  HENT:  Head: Normocephalic and atraumatic.  Mouth/Throat: Oropharynx is clear and moist. No oropharyngeal exudate.  Eyes: Pupils are  equal, round, and reactive to light. Conjunctivae and EOM are normal.  Neck: Normal range of motion. Neck supple.  No meningismus.  Cardiovascular: Normal rate, regular rhythm, normal heart sounds and intact distal pulses.  No murmur heard. Pulmonary/Chest: Effort normal and breath sounds normal. No respiratory distress.  Abdominal: Soft. There is no tenderness. There is no rebound and no guarding.  Genitourinary:  Genitourinary Comments: Multiple small external hemorrhoids, nonthrombosed, no gross blood, no melena  Musculoskeletal: Normal range of motion. She exhibits no edema or tenderness.  Neurological: She is alert and oriented to person, place, and time. No cranial nerve deficit. She exhibits normal muscle tone. Coordination normal.   5/5 strength throughout. CN 2-12 intact.Equal grip strength.   Skin: Skin is warm.  Psychiatric: She has a normal mood and affect. Her behavior is normal.  Nursing note and vitals reviewed.    ED Treatments / Results  Labs (all labs ordered are listed, but only abnormal results are displayed) Labs Reviewed  COMPREHENSIVE METABOLIC PANEL - Abnormal; Notable for the following components:      Result Value   Potassium 2.8 (*)    Glucose, Bld 144 (*)    Albumin 3.0 (*)    All other components within  normal limits  CBC - Abnormal; Notable for the following components:   RBC 3.62 (*)    Hemoglobin 11.5 (*)    HCT 32.0 (*)    All other components within normal limits  URINALYSIS, ROUTINE W REFLEX MICROSCOPIC  POC OCCULT BLOOD, ED    EKG EKG Interpretation  Date/Time:  Thursday July 07 2017 22:43:37 EDT Ventricular Rate:  67 PR Interval:    QRS Duration: 184 QT Interval:  456 QTC Calculation: 482 R Axis:   -75 Text Interpretation:  Atrial-paced rhythm IVCD, consider atypical RBBB LVH with IVCD and secondary repol abnrm Borderline prolonged QT interval Nonspecific T wave abnormality Confirmed by Ezequiel Essex [phone]) on 06/28/2017 11:21:18 PM   Radiology Dg Chest 2 View  Result Date: 07/02/2017 CLINICAL DATA:  Bright red blood per rectum. EXAM: CHEST - 2 VIEW COMPARISON:  05/23/2017 CXR, 06/14/2017 chest CT FINDINGS: Cardiomegaly with aortic atherosclerosis, stable in appearance. Left-sided pacemaker apparatus with leads in the right atrium and right ventricle are noted. There is bibasilar atelectasis. Pulmonary consolidation in the right upper lobe consistent with pneumonia is identified, a new finding since prior. Minimal blunting of the costophrenic angles may reflect tiny pleural effusions and/or atelectasis. No overt pulmonary edema. Degenerative changes are present along the dorsal spine. IMPRESSION: New right upper lobe pulmonary consolidation consistent with pneumonia. Bibasilar subsegmental atelectasis. Stable cardiomegaly with aortic atherosclerosis. Electronically Signed   By: Ashley Royalty M.D.   On: 06/26/2017 23:57    Procedures Procedures (including critical care time)  Medications Ordered in ED Medications - No data to display   Initial Impression / Assessment and Plan / ED Course  I have reviewed the triage vital signs and the nursing notes.  Pertinent labs & imaging results that were available during my care of the patient were reviewed by me and considered  in my medical decision making (see chart for details).    Patient with rectal bleeding for several weeks.  She has generalized weakness.  She is hemodynamic is stable.  She takes Eliquis.  Hemoglobin is stable.  Heart rate is positive with standing.  Will gently hydrate.  EKG shows paced rhythm.  Hemoglobin is stable.  Hemoccult is negative the patient reports having melena and bright red  blood at home.  She is on Eliquis.  Denies abdominal pain.  Reports colonoscopy in the past has been normal.  On resting she has hypoxia into the high 80s.  Chest x-ray shows right upper lobe pneumonia.  Patient does admit to recent cough.  D-dimer is positive.  However given the finding of pneumonia suspect this is although his source of her hypoxia.  She is treated with Rocephin and azithromycin after blood cultures are sent.  Oxygen supplementation provided.  Admission to hospital discussed with Dr. Maudie Mercury.  He does wish to proceed with chest CT as well as abdominal CT given her rectal bleeding.  CRITICAL CARE Performed by: Ezequiel Essex Total critical care time: 32 minutes Critical care time was exclusive of separately billable procedures and treating other patients. Critical care was necessary to treat or prevent imminent or life-threatening deterioration. Critical care was time spent personally by me on the following activities: development of treatment plan with patient and/or surrogate as well as nursing, discussions with consultants, evaluation of patient's response to treatment, examination of patient, obtaining history from patient or surrogate, ordering and performing treatments and interventions, ordering and review of laboratory studies, ordering and review of radiographic studies, pulse oximetry and re-evaluation of patient's condition.   Final Clinical Impressions(s) / ED Diagnoses   Final diagnoses:  Rectal bleeding  Community acquired pneumonia of right upper lobe of lung [facility])    ED  Discharge Orders    None       Jnaya Butrick, Annie Main, MD 07/08/17 [id number]

## 2017-07-07 NOTE — ED Notes (Signed)
Family at bedside. 

## 2017-07-07 NOTE — ED Notes (Signed)
Pt. Reports rectal bleeding off and on for the last 3 weeks not a continuous but off and on.

## 2017-07-07 NOTE — ED Triage Notes (Signed)
Bright red rectal bleeding for a few weeks. She was told by her MD to come here. She takes Eliquis.

## 2017-07-08 ENCOUNTER — Other Ambulatory Visit: Payer: Self-pay

## 2017-07-08 ENCOUNTER — Inpatient Hospital Stay (HOSPITAL_BASED_OUTPATIENT_CLINIC_OR_DEPARTMENT_OTHER): Payer: Medicare Other

## 2017-07-08 DIAGNOSIS — I5033 Acute on chronic diastolic (congestive) heart failure: Secondary | ICD-10-CM | POA: Diagnosis present

## 2017-07-08 DIAGNOSIS — I712 Thoracic aortic aneurysm, without rupture: Secondary | ICD-10-CM | POA: Diagnosis present

## 2017-07-08 DIAGNOSIS — J189 Pneumonia, unspecified organism: Secondary | ICD-10-CM

## 2017-07-08 DIAGNOSIS — J984 Other disorders of lung: Secondary | ICD-10-CM | POA: Diagnosis not present

## 2017-07-08 DIAGNOSIS — Z4682 Encounter for fitting and adjustment of non-vascular catheter: Secondary | ICD-10-CM | POA: Diagnosis not present

## 2017-07-08 DIAGNOSIS — Z515 Encounter for palliative care: Secondary | ICD-10-CM | POA: Diagnosis not present

## 2017-07-08 DIAGNOSIS — R7989 Other specified abnormal findings of blood chemistry: Secondary | ICD-10-CM | POA: Diagnosis not present

## 2017-07-08 DIAGNOSIS — I481 Persistent atrial fibrillation: Secondary | ICD-10-CM | POA: Diagnosis present

## 2017-07-08 DIAGNOSIS — K625 Hemorrhage of anus and rectum: Secondary | ICD-10-CM

## 2017-07-08 DIAGNOSIS — D649 Anemia, unspecified: Secondary | ICD-10-CM

## 2017-07-08 DIAGNOSIS — N281 Cyst of kidney, acquired: Secondary | ICD-10-CM | POA: Diagnosis present

## 2017-07-08 DIAGNOSIS — D62 Acute posthemorrhagic anemia: Secondary | ICD-10-CM | POA: Diagnosis present

## 2017-07-08 DIAGNOSIS — I10 Essential (primary) hypertension: Secondary | ICD-10-CM | POA: Diagnosis not present

## 2017-07-08 DIAGNOSIS — I700 Atherosclerosis of aorta: Secondary | ICD-10-CM | POA: Diagnosis not present

## 2017-07-08 DIAGNOSIS — K922 Gastrointestinal hemorrhage, unspecified: Secondary | ICD-10-CM | POA: Diagnosis present

## 2017-07-08 DIAGNOSIS — E871 Hypo-osmolality and hyponatremia: Secondary | ICD-10-CM | POA: Diagnosis present

## 2017-07-08 DIAGNOSIS — J9690 Respiratory failure, unspecified, unspecified whether with hypoxia or hypercapnia: Secondary | ICD-10-CM | POA: Diagnosis not present

## 2017-07-08 DIAGNOSIS — Z66 Do not resuscitate: Secondary | ICD-10-CM | POA: Diagnosis present

## 2017-07-08 DIAGNOSIS — Z7901 Long term (current) use of anticoagulants: Secondary | ICD-10-CM | POA: Diagnosis not present

## 2017-07-08 DIAGNOSIS — R6520 Severe sepsis without septic shock: Secondary | ICD-10-CM | POA: Diagnosis present

## 2017-07-08 DIAGNOSIS — A419 Sepsis, unspecified organism: Secondary | ICD-10-CM | POA: Diagnosis present

## 2017-07-08 DIAGNOSIS — K219 Gastro-esophageal reflux disease without esophagitis: Secondary | ICD-10-CM | POA: Diagnosis present

## 2017-07-08 DIAGNOSIS — I2510 Atherosclerotic heart disease of native coronary artery without angina pectoris: Secondary | ICD-10-CM | POA: Diagnosis present

## 2017-07-08 DIAGNOSIS — J9601 Acute respiratory failure with hypoxia: Secondary | ICD-10-CM | POA: Diagnosis not present

## 2017-07-08 DIAGNOSIS — G9340 Encephalopathy, unspecified: Secondary | ICD-10-CM | POA: Diagnosis not present

## 2017-07-08 DIAGNOSIS — E876 Hypokalemia: Secondary | ICD-10-CM | POA: Diagnosis present

## 2017-07-08 DIAGNOSIS — J181 Lobar pneumonia, unspecified organism: Secondary | ICD-10-CM | POA: Diagnosis not present

## 2017-07-08 DIAGNOSIS — I480 Paroxysmal atrial fibrillation: Secondary | ICD-10-CM | POA: Diagnosis present

## 2017-07-08 DIAGNOSIS — N318 Other neuromuscular dysfunction of bladder: Secondary | ICD-10-CM | POA: Diagnosis present

## 2017-07-08 DIAGNOSIS — I272 Pulmonary hypertension, unspecified: Secondary | ICD-10-CM | POA: Diagnosis present

## 2017-07-08 DIAGNOSIS — K769 Liver disease, unspecified: Secondary | ICD-10-CM | POA: Diagnosis present

## 2017-07-08 DIAGNOSIS — M159 Polyosteoarthritis, unspecified: Secondary | ICD-10-CM | POA: Diagnosis present

## 2017-07-08 DIAGNOSIS — I110 Hypertensive heart disease with heart failure: Secondary | ICD-10-CM | POA: Diagnosis present

## 2017-07-08 DIAGNOSIS — Z7189 Other specified counseling: Secondary | ICD-10-CM | POA: Diagnosis not present

## 2017-07-08 LAB — OCCULT BLOOD X 1 CARD TO LAB, STOOL: FECAL OCCULT BLD: NEGATIVE

## 2017-07-08 LAB — URINALYSIS, MICROSCOPIC (REFLEX)

## 2017-07-08 LAB — CBC
HCT: 28.4 % — ABNORMAL LOW (ref 36.0–46.0)
Hemoglobin: 9.9 g/dL — ABNORMAL LOW (ref 12.0–15.0)
MCH: 30.8 pg (ref 26.0–34.0)
MCHC: 34.9 g/dL (ref 30.0–36.0)
MCV: 88.5 fL (ref 78.0–100.0)
PLATELETS: 205 10*3/uL (ref 150–400)
RBC: 3.21 MIL/uL — ABNORMAL LOW (ref 3.87–5.11)
RDW: 15.4 % (ref 11.5–15.5)
WBC: 7.4 10*3/uL (ref 4.0–10.5)

## 2017-07-08 LAB — EXPECTORATED SPUTUM ASSESSMENT W GRAM STAIN, RFLX TO RESP C: Special Requests: NORMAL

## 2017-07-08 LAB — URINALYSIS, ROUTINE W REFLEX MICROSCOPIC
BILIRUBIN URINE: NEGATIVE
Glucose, UA: NEGATIVE mg/dL
Ketones, ur: NEGATIVE mg/dL
NITRITE: NEGATIVE
Protein, ur: 30 mg/dL — AB
SPECIFIC GRAVITY, URINE: 1.025 (ref 1.005–1.030)
pH: 6 (ref 5.0–8.0)

## 2017-07-08 LAB — HEMOGLOBIN AND HEMATOCRIT, BLOOD
HCT: 28.6 % — ABNORMAL LOW (ref 36.0–46.0)
Hemoglobin: 10 g/dL — ABNORMAL LOW (ref 12.0–15.0)

## 2017-07-08 LAB — D-DIMER, QUANTITATIVE (NOT AT ARMC): D DIMER QUANT: 1.17 ug{FEU}/mL — AB (ref 0.00–0.50)

## 2017-07-08 LAB — MRSA PCR SCREENING: MRSA BY PCR: NEGATIVE

## 2017-07-08 LAB — STREP PNEUMONIAE URINARY ANTIGEN: STREP PNEUMO URINARY ANTIGEN: NEGATIVE

## 2017-07-08 LAB — I-STAT CG4 LACTIC ACID, ED: Lactic Acid, Venous: 1.06 mmol/L (ref 0.5–1.9)

## 2017-07-08 MED ORDER — IOPAMIDOL (ISOVUE-370) INJECTION 76%
100.0000 mL | Freq: Once | INTRAVENOUS | Status: AC | PRN
  Administered 2017-07-08: 100 mL via INTRAVENOUS

## 2017-07-08 MED ORDER — POTASSIUM CHLORIDE CRYS ER 20 MEQ PO TBCR
40.0000 meq | EXTENDED_RELEASE_TABLET | Freq: Once | ORAL | Status: AC
  Administered 2017-07-08: 40 meq via ORAL
  Filled 2017-07-08: qty 2

## 2017-07-08 MED ORDER — SODIUM CHLORIDE 0.9 % IV SOLN
INTRAVENOUS | Status: AC
  Administered 2017-07-08: 06:00:00 via INTRAVENOUS

## 2017-07-08 MED ORDER — POTASSIUM CHLORIDE 10 MEQ/100ML IV SOLN
10.0000 meq | INTRAVENOUS | Status: AC
  Administered 2017-07-08: 10 meq via INTRAVENOUS
  Filled 2017-07-08: qty 100

## 2017-07-08 MED ORDER — FLUTICASONE PROPIONATE 50 MCG/ACT NA SUSP: 2.0000 | Freq: Every day | NASAL | Status: DC | PRN

## 2017-07-08 MED ORDER — SODIUM CHLORIDE 0.9 % IV SOLN
1.0000 g | Freq: Once | INTRAVENOUS | Status: AC
  Administered 2017-07-08: 1 g via INTRAVENOUS
  Filled 2017-07-08: qty 10

## 2017-07-08 MED ORDER — SODIUM CHLORIDE 0.9 % IV SOLN
8.0000 mg/h | INTRAVENOUS | Status: DC
  Administered 2017-07-08 – 2017-07-09 (×3): 8 mg/h via INTRAVENOUS
  Filled 2017-07-08 (×4): qty 80

## 2017-07-08 MED ORDER — ENSURE ENLIVE PO LIQD
237.0000 mL | Freq: Two times a day (BID) | ORAL | Status: DC
  Administered 2017-07-09 – 2017-07-11 (×4): 237 mL via ORAL

## 2017-07-08 MED ORDER — SODIUM CHLORIDE 0.9 % IV SOLN
500.0000 mg | Freq: Once | INTRAVENOUS | Status: AC
  Administered 2017-07-08: 500 mg via INTRAVENOUS
  Filled 2017-07-08: qty 500

## 2017-07-08 MED ORDER — LORATADINE 10 MG PO TABS: 10.0000 mg | ORAL_TABLET | Freq: Every day | ORAL | Status: DC | PRN

## 2017-07-08 MED ORDER — SODIUM CHLORIDE 0.9 % IV SOLN
1.0000 g | INTRAVENOUS | Status: DC
  Administered 2017-07-08 – 2017-07-09 (×2): 1 g via INTRAVENOUS
  Filled 2017-07-08 (×2): qty 1

## 2017-07-08 MED ORDER — AZITHROMYCIN 500 MG IV SOLR
INTRAVENOUS | Status: AC
  Filled 2017-07-08: qty 500

## 2017-07-08 MED ORDER — METOPROLOL SUCCINATE ER 25 MG PO TB24
25.0000 mg | ORAL_TABLET | Freq: Every day | ORAL | Status: DC
  Administered 2017-07-08 – 2017-07-12 (×5): 25 mg via ORAL
  Filled 2017-07-08 (×5): qty 1

## 2017-07-08 MED ORDER — DILTIAZEM HCL ER COATED BEADS 120 MG PO CP24
240.0000 mg | ORAL_CAPSULE | Freq: Every day | ORAL | Status: DC
  Administered 2017-07-08 – 2017-07-13 (×6): 240 mg via ORAL
  Filled 2017-07-08 (×6): qty 2

## 2017-07-08 MED ORDER — GUAIFENESIN-DM 100-10 MG/5ML PO SYRP
5.0000 mL | ORAL_SOLUTION | ORAL | Status: DC | PRN
  Administered 2017-07-08 – 2017-07-12 (×11): 5 mL via ORAL
  Filled 2017-07-08 (×11): qty 10

## 2017-07-08 MED ORDER — ORAL CARE MOUTH RINSE
15.0000 mL | Freq: Two times a day (BID) | OROMUCOSAL | Status: DC
  Administered 2017-07-08 – 2017-07-14 (×11): 15 mL via OROMUCOSAL

## 2017-07-08 MED ORDER — AMIODARONE HCL 200 MG PO TABS
200.0000 mg | ORAL_TABLET | Freq: Every day | ORAL | Status: DC
  Administered 2017-07-08 – 2017-07-13 (×6): 200 mg via ORAL
  Filled 2017-07-08 (×7): qty 1

## 2017-07-08 MED ORDER — SODIUM CHLORIDE 0.9 % IV SOLN
500.0000 mg | INTRAVENOUS | Status: DC
  Administered 2017-07-09 – 2017-07-13 (×6): 500 mg via INTRAVENOUS
  Filled 2017-07-08 (×5): qty 500

## 2017-07-08 MED ORDER — PANTOPRAZOLE SODIUM 40 MG IV SOLR
80.0000 mg | Freq: Once | INTRAVENOUS | Status: AC
  Administered 2017-07-08: 80 mg via INTRAVENOUS
  Filled 2017-07-08: qty 80

## 2017-07-08 NOTE — Progress Notes (Signed)
Initial Nutrition Assessment  DOCUMENTATION CODES:   Not applicable  INTERVENTION:   - Ensure Enlive po BID, each supplement provides 350 kcal and 20 grams of protein  - Encourage PO intake  NUTRITION DIAGNOSIS:   Inadequate oral intake related to poor appetite as evidenced by per patient/family report.  GOAL:   Patient will meet greater than or equal to 90% of their needs  MONITOR:   PO intake, Supplement acceptance, Diet advancement  REASON FOR ASSESSMENT:   Malnutrition Screening Tool    ASSESSMENT:   84 year old female who presented to ED with intermittent rectal bleeding for the past several weeks. PMH significant for GERD, hypertension, hyperlipidemia, aortic aneurysm, and Pafib. Admitted for pneumonia.  Spoke with pt at bedside who reports not having an appetite in weeks. Pt sates she has "no taste for food" and "no desire to eat." Over the past few weeks, pt has tried to eat 3 meals daily but the portion sizes at each meal have been much smaller than normal. Breakfast might include bites of grits with butter, scrambled eggs, and breakfast sausage. Lunch might include a sandwich or crackers with cheese.  Pt with full liquid breakfast tray in room at time of visit. Pt had completed approximately 50% of the tomato soup, coffee, and yogurt. Pudding was untouched at time of visit.  Pt reports her UBW as 163 lbs and that she weighs this now. Pt states she has lost weight ("1-2 lbs"). No weight loss noted in pt's chart.  Pt is agreeable to receiving Ensure Enlive oral nutrition supplements. RD to order. Pt denies any chewing or swallowing difficulties.  Medications reviewed and include: IV Protonix  Labs reviewed: hemoglobin 9.9 (L), HCT 28.4 (L), potassium 2.8 (L)  NUTRITION - FOCUSED PHYSICAL EXAM:    Most Recent Value  Orbital Region  Mild depletion  Upper Arm Region  No depletion  Thoracic and Lumbar Region  No depletion  Buccal Region  No depletion  Temple  Region  Mild depletion  Clavicle Bone Region  No depletion  Clavicle and Acromion Bone Region  No depletion  Scapular Bone Region  Unable to assess  Dorsal Hand  No depletion  Patellar Region  No depletion  Anterior Thigh Region  No depletion  Posterior Calf Region  Mild depletion  Edema (RD Assessment)  Mild  Hair  Reviewed  Eyes  Reviewed  Mouth  Reviewed  Skin  Reviewed  Nails  Reviewed       Diet Order:  Diet full liquid Room service appropriate? Yes; Fluid consistency: Thin  EDUCATION NEEDS:   No education needs have been identified at this time  Skin:  Skin Assessment: Reviewed RN Assessment  Last BM:  07/05/2017  Height:   Ht Readings from Last 1 Encounters:  07/08/17 5\' 5"  (1.651 m)    Weight:   Wt Readings from Last 1 Encounters:  07/08/17 164 lb 0.4 oz (74.4 kg)    Ideal Body Weight:  56.8 kg  BMI:  Body mass index is 27.29 kg/m.  Estimated Nutritional Needs:   Kcal:  1400-1600 kcal/day (MSJ x 1.2-1.4)  Protein:  75-90 grams/day  Fluid:  1.4-1.6 L/day    Gaynell Face, MS, RD, LDN Pager: [phone] Weekend/After Hours: [phone]

## 2017-07-08 NOTE — ED Notes (Signed)
Attempted report, stepdown will call back when ready to receive report.

## 2017-07-08 NOTE — ED Notes (Signed)
REport called to the ICU/SD at [facility] given Report

## 2017-07-08 NOTE — ED Notes (Signed)
Patient transported to CT 

## 2017-07-08 NOTE — Progress Notes (Addendum)
PROGRESS NOTE  [name] IRW:[id number] DOB: 1926/09/15 DOA: 07/05/2017 PCP: Alroy Dust, L.Marlou Sa, MD  HPI/Recap of past 24 hours: Molly Taylor  is a 84 y.o. female with history of hypertension, hyperlipidemia, thoracic aortic aneurysm, tachy-brady, P.afib on eliquis, GERD presents to the ED, c/o worsening productive cough, SOB, fatigue for the past 3 weeks, denies any fever/chills, chest pain. Pt also reported having black stools about several weeks ago, which have now resolved. Over the past week, has had intermittent brbpr mixed with stool. Pt denies abdominal pain, N/V, diarrhea/constipation and then her stool turned brown and over the past 1 week she has had slight brbpr. Pt denies fever, chills, n/v,  abd pain, diarrhea, constipation. In the ED pt noted to be hypoxic on RA with sats in the 80s, FOBT was noted to be negative, CTA chest done showed multifocal PNA, no PE noted. Pt admitted for further management.  Today, pt still c/o coughing spells, fatigue and poor appetite. Denies any further BRBPR/melena, no fever/chills, chest pain, worsening SOB, abdominal pain   Assessment/Plan: Principal Problem:   Community acquired pneumonia of right upper lobe of lung ([facility]) Active Problems:   Rectal bleeding   Anemia  Acute hypoxic respiratory failure 2/2 multifocal PNA Pt sat well on 2L of O2 Afebrile with no leukocytosis BC X 2 pending Urine strep pneumo negative, legionella pending CT chest showed: multifocal PNA, no PE Continue IV Rocephin, Azithromycin Plan to wean off O2  Lower GI bleed Hx of intermittent BRBPR, non noted since admission Hgb drop noted, FOBT negative CT abd/pelvis done in ER showed no focal abnormality seen to explain the patient's rectal bleeding Will consult GI if bleeding noted/drop in hemoglobin Continue PPI  Acute blood loss anemia Likely due to above Hgb baseline around 11-12, noted 11.5 on admission Daily CBC  Hypokalemia Replace prn  P.  Afib Paced rhythm, rate controlled Continue amiodarone, diltiazem, metoprolol Held eliquis due to ?GIB  HTN Stable Continue home meds  GERD PPI     Code Status: Full  Family Communication: None at bedside  Disposition Plan: Home once dyspnea improves   Consultants:  None  Procedures:  None  Antimicrobials:  IV Rocephin  Azithromycin  DVT prophylaxis:  Held eliquis   Objective: Vitals:   07/08/17 1546 07/08/17 1600 07/08/17 1946 07/08/17 2000  BP:  (!) 143/65  (!) 140/54  Pulse:  64  62  Resp:  19  (!) 26  Temp: 98.4 F (36.9 C)  98.1 F (36.7 C)   TempSrc: Oral  Oral   SpO2:  94%  91%  Weight:      Height:        Intake/Output Summary (Last 24 hours) at 07/08/2017 2045 Last data filed at 07/08/2017 1625 Gross per 24 hour  Intake 1516.67 ml  Output 550 ml  Net 966.67 ml   Filed Weights   06/25/2017 2003 07/08/17 0344  Weight: 72.6 kg (160 lb) 74.4 kg (164 lb 0.4 oz)    Exam:   General: NAD   Cardiovascular: S1, S2 present  Respiratory: Bibasilar crackles noted  Abdomen: Soft, NT, ND, BS present  Musculoskeletal: No pedal edema bilaterally  Skin: Normal  Psychiatry: Normal mood   Data Reviewed: CBC: Recent Labs  Lab 07/06/2017 2158 07/08/17 0701 07/08/17 1312  WBC 7.1 7.4  --   HGB 11.5* 9.9* 10.0*  HCT 32.0* 28.4* 28.6*  MCV 88.4 88.5  --   PLT 228 205  --    Basic Metabolic Panel:  Recent Labs  Lab 06/25/2017 2158  NA 138  K 2.8*  CL 105  CO2 22  GLUCOSE 144*  BUN 14  CREATININE 0.68  CALCIUM 9.1   GFR: Estimated Creatinine Clearance: 49.1 mL/min (by C-G formula based on SCr of 0.68 mg/dL). Liver Function Tests: Recent Labs  Lab 06/24/2017 2158  AST 35  ALT 23  ALKPHOS 125  BILITOT 1.1  PROT 7.1  ALBUMIN 3.0*   No results for input(s): LIPASE, AMYLASE in the last 168 hours. No results for input(s): AMMONIA in the last 168 hours. Coagulation Profile: No results for input(s): INR, PROTIME in the last 168  hours. Cardiac Enzymes: No results for input(s): CKTOTAL, CKMB, CKMBINDEX, TROPONINI in the last 168 hours. BNP (last 3 results) No results for input(s): PROBNP in the last 8760 hours. HbA1C: No results for input(s): HGBA1C in the last 72 hours. CBG: No results for input(s): GLUCAP in the last 168 hours. Lipid Profile: No results for input(s): CHOL, HDL, LDLCALC, TRIG, CHOLHDL, LDLDIRECT in the last 72 hours. Thyroid Function Tests: No results for input(s): TSH, T4TOTAL, FREET4, T3FREE, THYROIDAB in the last 72 hours. Anemia Panel: No results for input(s): VITAMINB12, FOLATE, FERRITIN, TIBC, IRON, RETICCTPCT in the last 72 hours. Urine analysis:    Component Value Date/Time   COLORURINE YELLOW 07/08/2017 0252   APPEARANCEUR CLEAR 07/08/2017 0252   LABSPEC 1.025 07/08/2017 0252   PHURINE 6.0 07/08/2017 0252   GLUCOSEU NEGATIVE 07/08/2017 0252   HGBUR TRACE (A) 07/08/2017 0252   BILIRUBINUR NEGATIVE 07/08/2017 0252   KETONESUR NEGATIVE 07/08/2017 0252   PROTEINUR 30 (A) 07/08/2017 0252   UROBILINOGEN 0.2 03/16/2011 1529   NITRITE NEGATIVE 07/08/2017 0252   LEUKOCYTESUR TRACE (A) 07/08/2017 0252   Sepsis Labs: @LABRCNTIP (procalcitonin:4,lacticidven:4)  ) Recent Results (from the past 240 hour(s))  MRSA PCR Screening     Status: None   Collection Time: 07/08/17  3:38 AM  Result Value Ref Range Status   MRSA by PCR NEGATIVE NEGATIVE Final    Comment:        The GeneXpert MRSA Assay (FDA approved for NASAL specimens only), is one component of a comprehensive MRSA colonization surveillance program. It is not intended to diagnose MRSA infection nor to guide or monitor treatment for MRSA infections. Performed at [facility], [place] [address] [place], [place] [address]   Culture, sputum-assessment     Status: None   Collection Time: 07/08/17 11:38 AM  Result Value Ref Range Status   Specimen Description SPUTUM  Final   Special Requests Normal  Final    Sputum evaluation   Final    Sputum specimen not acceptable for testing.  Please recollect.   Performed at [facility], [place] [address], [place], [place] [address]    Report Status 07/08/2017 FINAL  Final      Studies: Dg Chest 2 View  Result Date: 06/27/2017 CLINICAL DATA:  Bright red blood per rectum. EXAM: CHEST - 2 VIEW COMPARISON:  05/23/2017 CXR, 06/14/2017 chest CT FINDINGS: Cardiomegaly with aortic atherosclerosis, stable in appearance. Left-sided pacemaker apparatus with leads in the right atrium and right ventricle are noted. There is bibasilar atelectasis. Pulmonary consolidation in the right upper lobe consistent with pneumonia is identified, a new finding since prior. Minimal blunting of the costophrenic angles may reflect tiny pleural effusions and/or atelectasis. No overt pulmonary edema. Degenerative changes are present along the dorsal spine. IMPRESSION: New right upper lobe pulmonary consolidation consistent with pneumonia. Bibasilar subsegmental atelectasis. Stable  cardiomegaly with aortic atherosclerosis. Electronically Signed   By: Ashley Royalty M.D.   On: 06/28/2017 23:57   Ct Angio Chest Pe W And/or Wo Contrast  Result Date: 07/08/2017 CLINICAL DATA:  Subacute onset of bright red rectal bleeding. Atrial fibrillation. Mild hypoxia. Pneumonia. Elevated D-dimer. EXAM: CT ANGIOGRAPHY CHEST CT ABDOMEN AND PELVIS WITH CONTRAST TECHNIQUE: Multidetector CT imaging of the chest was performed using the standard protocol during bolus administration of intravenous contrast. Multiplanar CT image reconstructions and MIPs were obtained to evaluate the vascular anatomy. Multidetector CT imaging of the abdomen and pelvis was performed using the standard protocol during bolus administration of intravenous contrast. CONTRAST:  171mL ISOVUE-370 IOPAMIDOL (ISOVUE-370) INJECTION 76% COMPARISON:  CT of the abdomen performed 03/25/2005, and CTA of the chest performed 06/14/2017  FINDINGS: CTA CHEST FINDINGS Cardiovascular:  There is no evidence of pulmonary embolus. The heart is mildly enlarged. Mild calcification is noted along the thoracic aorta. The great vessels are grossly unremarkable in appearance. A pacemaker is noted at the left chest wall, with leads ending at the right atrium and right ventricle. There is mild prominence of the ascending thoracic aorta to 4.3 cm in AP dimension, which is likely within physiologic limits for the patient's age. Mediastinum/Nodes: Mildly enlarged right hilar and mediastinal nodes are seen, measuring up to 1.2 cm in short axis. Prominent nodes are noted at the right paratracheal region, subcarinal region and azygoesophageal recess. No pericardial effusion is identified. The thyroid gland is unremarkable. No axillary lymphadenopathy is seen. Lungs/Pleura: Multifocal right-sided airspace opacification is noted, with some degree of crazy paving. Minimal opacity is also noted at the left lung apex. This more likely reflects pneumonia. Chronic bibasilar airspace opacities are again noted. No pleural effusion or pneumothorax is seen. Musculoskeletal: No acute osseous abnormalities are identified. The visualized musculature is unremarkable in appearance. Review of the MIP images confirms the above findings. CT ABDOMEN and PELVIS FINDINGS Hepatobiliary: Scattered nonspecific hypodensities within the liver measure up to 1.9 cm in size. The liver is otherwise unremarkable. The patient is status post cholecystectomy, with clips noted at the gallbladder fossa. The common bile duct is normal in caliber. Pancreas: The pancreas is within normal limits. Spleen: The spleen is unremarkable in appearance. Adrenals/Urinary Tract: The adrenal glands are unremarkable in appearance. A large left renal cyst is noted, with minimal peripheral calcification. There is no evidence of hydronephrosis. No renal or ureteral stones are identified. No perinephric stranding is seen.  Stomach/Bowel: The stomach is unremarkable in appearance. The small bowel is within normal limits. The appendix is normal in caliber, without evidence of appendicitis. The colon is unremarkable in appearance. Vascular/Lymphatic: Scattered calcification is seen along the abdominal aorta and its branches. The abdominal aorta is otherwise grossly unremarkable. The inferior vena cava is grossly unremarkable. No retroperitoneal lymphadenopathy is seen. No pelvic sidewall lymphadenopathy is identified. Reproductive: The bladder is mildly distended and grossly unremarkable. The patient is status post hysterectomy. No suspicious adnexal masses are seen. Other: No additional soft tissue abnormalities are seen. Musculoskeletal: No acute osseous abnormalities are identified. Multilevel vacuum phenomenon is noted along the lower lumbar spine. The visualized musculature is unremarkable in appearance. Review of the MIP images confirms the above findings. IMPRESSION: 1. No evidence of pulmonary embolus. 2. No focal abnormality seen to explain the patient's rectal bleeding. 3. Multifocal right-sided airspace opacification, with underlying crazy paving. Minimal airspace opacity at the left lung apex. This more likely reflects multifocal pneumonia, new from the prior study. Underlying chronic  opacities again noted. 4. Mildly enlarged right hilar and mediastinal nodes likely reflect the acute infection. 5. Mild cardiomegaly. 6. Large left renal cyst. Scattered nonspecific hypodensities within the liver may reflect cysts. Aortic Atherosclerosis (ICD10-I70.0). Electronically Signed   By: Garald Balding M.D.   On: 07/08/2017 03:03   Ct Abdomen Pelvis W Contrast  Result Date: 07/08/2017 CLINICAL DATA:  Subacute onset of bright red rectal bleeding. Atrial fibrillation. Mild hypoxia. Pneumonia. Elevated D-dimer. EXAM: CT ANGIOGRAPHY CHEST CT ABDOMEN AND PELVIS WITH CONTRAST TECHNIQUE: Multidetector CT imaging of the chest was performed  using the standard protocol during bolus administration of intravenous contrast. Multiplanar CT image reconstructions and MIPs were obtained to evaluate the vascular anatomy. Multidetector CT imaging of the abdomen and pelvis was performed using the standard protocol during bolus administration of intravenous contrast. CONTRAST:  182mL ISOVUE-370 IOPAMIDOL (ISOVUE-370) INJECTION 76% COMPARISON:  CT of the abdomen performed 03/25/2005, and CTA of the chest performed 06/14/2017 FINDINGS: CTA CHEST FINDINGS Cardiovascular:  There is no evidence of pulmonary embolus. The heart is mildly enlarged. Mild calcification is noted along the thoracic aorta. The great vessels are grossly unremarkable in appearance. A pacemaker is noted at the left chest wall, with leads ending at the right atrium and right ventricle. There is mild prominence of the ascending thoracic aorta to 4.3 cm in AP dimension, which is likely within physiologic limits for the patient's age. Mediastinum/Nodes: Mildly enlarged right hilar and mediastinal nodes are seen, measuring up to 1.2 cm in short axis. Prominent nodes are noted at the right paratracheal region, subcarinal region and azygoesophageal recess. No pericardial effusion is identified. The thyroid gland is unremarkable. No axillary lymphadenopathy is seen. Lungs/Pleura: Multifocal right-sided airspace opacification is noted, with some degree of crazy paving. Minimal opacity is also noted at the left lung apex. This more likely reflects pneumonia. Chronic bibasilar airspace opacities are again noted. No pleural effusion or pneumothorax is seen. Musculoskeletal: No acute osseous abnormalities are identified. The visualized musculature is unremarkable in appearance. Review of the MIP images confirms the above findings. CT ABDOMEN and PELVIS FINDINGS Hepatobiliary: Scattered nonspecific hypodensities within the liver measure up to 1.9 cm in size. The liver is otherwise unremarkable. The patient is  status post cholecystectomy, with clips noted at the gallbladder fossa. The common bile duct is normal in caliber. Pancreas: The pancreas is within normal limits. Spleen: The spleen is unremarkable in appearance. Adrenals/Urinary Tract: The adrenal glands are unremarkable in appearance. A large left renal cyst is noted, with minimal peripheral calcification. There is no evidence of hydronephrosis. No renal or ureteral stones are identified. No perinephric stranding is seen. Stomach/Bowel: The stomach is unremarkable in appearance. The small bowel is within normal limits. The appendix is normal in caliber, without evidence of appendicitis. The colon is unremarkable in appearance. Vascular/Lymphatic: Scattered calcification is seen along the abdominal aorta and its branches. The abdominal aorta is otherwise grossly unremarkable. The inferior vena cava is grossly unremarkable. No retroperitoneal lymphadenopathy is seen. No pelvic sidewall lymphadenopathy is identified. Reproductive: The bladder is mildly distended and grossly unremarkable. The patient is status post hysterectomy. No suspicious adnexal masses are seen. Other: No additional soft tissue abnormalities are seen. Musculoskeletal: No acute osseous abnormalities are identified. Multilevel vacuum phenomenon is noted along the lower lumbar spine. The visualized musculature is unremarkable in appearance. Review of the MIP images confirms the above findings. IMPRESSION: 1. No evidence of pulmonary embolus. 2. No focal abnormality seen to explain the patient's rectal bleeding. 3.  Multifocal right-sided airspace opacification, with underlying crazy paving. Minimal airspace opacity at the left lung apex. This more likely reflects multifocal pneumonia, new from the prior study. Underlying chronic opacities again noted. 4. Mildly enlarged right hilar and mediastinal nodes likely reflect the acute infection. 5. Mild cardiomegaly. 6. Large left renal cyst. Scattered  nonspecific hypodensities within the liver may reflect cysts. Aortic Atherosclerosis (ICD10-I70.0). Electronically Signed   By: Garald Balding M.D.   On: 07/08/2017 03:03    Scheduled Meds: . amiodarone  200 mg Oral Daily  . diltiazem  240 mg Oral Daily  . feeding supplement (ENSURE ENLIVE)  237 mL Oral BID BM  . mouth rinse  15 mL Mouth Rinse BID  . metoprolol succinate  25 mg Oral Daily    Continuous Infusions: . azithromycin    . cefTRIAXone (ROCEPHIN)  IV    . pantoprozole (PROTONIX) infusion 8 mg/hr (07/08/17 1625)     LOS: 0 days     Alma Friendly, MD [facility]   If 7PM-7AM, please contact night-coverage [email or web address] Password [facility] 07/08/2017, 8:45 PM

## 2017-07-08 NOTE — Progress Notes (Signed)
84 yo female with gerd, Pafib, pulmonary hypertension, c/o rectal bleeding and has mild hypoxia with pneumonia

## 2017-07-08 NOTE — Discharge Instructions (Signed)
[facility] Stay Proper nutrition can help your body recover from illness and injury.   Foods and beverages high in protein, vitamins, and minerals help rebuild muscle loss, promote healing, & reduce fall risk.   In addition to eating healthy foods, a nutrition shake is an easy, delicious way to get the nutrition you need during and after your hospital stay  It is recommended that you continue to drink 1-2 bottles per day of:       Ensure Enlive for at least 1 month (30 days) after your hospital stay   Tips for adding a nutrition shake into your routine: As allowed, drink one with vitamins or medications instead of water or juice Enjoy one as a tasty mid-morning or afternoon snack Drink cold or make a milkshake out of it Drink one instead of milk with cereal or snacks Use as a coffee creamer   Available at the following grocery stores and pharmacies:           * [place] [phone]            For COUPONS visit: www.ensure.com/join or [email or web address]-[email or web address]   Suggested Substitutions Ensure Plus = Boost Plus = Carnation Breakfast Essentials = Boost Compact Ensure Active Clear = Boost Breeze Glucerna Shake = Boost Glucose Control = Carnation Breakfast Essentials SUGAR FREE

## 2017-07-08 NOTE — H&P (Signed)
TRH H&P   Patient Demographics:    Molly Taylor, is a 84 y.o. female  MRN: [id number]   DOB - [birth year]  Admit Date - 07/02/2017  Outpatient Primary MD for the patient is Alroy Dust, L.Marlou Sa, MD  Referring MD/NP/PA:   Ezequiel Essex  Outpatient Specialists:  [facility]  Patient coming from: home  Chief Complaint  Patient presents with  . Rectal Bleeding      HPI:    Molly Taylor  is a 84 y.o. female, w Jerrye Bushy, hypertension, hyperlipidemia, thoracic aortic aneurysm, tachy-brady, Pafib on eliquis apparently c/o black stool starting several weeks ago, and then her stool turned brown and over the past 1 week she has had slight brbpr.  Pt denies fever, chills, n/v,  abd pain, diarrhea, constipation.   Pt notes slight cough over the past 3 weeks, yellow sputum.  Denies fever, chills, cp, palp, sob.  Pt presented due to rectal bleeding  In ED, .  CTA chest IMPRESSION: 1. No evidence of pulmonary embolus. 2. No focal abnormality seen to explain the patient's rectal bleeding. 3. Multifocal right-sided airspace opacification, with underlying crazy paving. Minimal airspace opacity at the left lung apex. This more likely reflects multifocal pneumonia, new from the prior study. Underlying chronic opacities again noted.  4. Mildly enlarged right hilar and mediastinal nodes likely reflect the acute infection. 5. Mild cardiomegaly. 6. Large left renal cyst. Scattered nonspecific hypodensities within the liver may reflect cysts.   FOBT negative  Na 138, K 2.8,  Bun 14, Creatinine 0.68 Ast 35, Alt 23 Wbc 7.1 Hgb 11.5, Plt 228 D dimer 1.17 Lactic acid 1.06  Pt will be admitted for multifocal pneumonia and also rectal bleeding    Review of systems:    In addition to the HPI above, No Fever-chills, No Headache, No changes with Vision or hearing, No problems swallowing food or  Liquids, No Chest pain, No Shortness of Breath,  No Blood in Urine, No dysuria, No new skin rashes or bruises, No new joints pains-aches,  No new weakness, tingling, numbness in any extremity, No recent weight gain or loss, No polyuria, polydypsia or polyphagia, No significant Mental Stressors.  A full 10 point Review of Systems was done, except as stated above, all other Review of Systems were negative.   With Past History of the following :    Past Medical History:  Diagnosis Date  . Allergic rhinitis, cause unspecified   . Coronary artery calcification seen on CAT scan 06/16/2017   Calcifcations noted in LAD.  Chest CT angio 05/2017  . Family history of adverse reaction to anesthesia    all children - PONV  . Generalized osteoarthrosis, unspecified site   . GERD (gastroesophageal reflux disease)    pt. denies  . Hemorrhage of rectum and anus   . History of bronchitis   . HTN (hypertension)   . Hypercholesteremia  pt. denies  . Hypertonicity of bladder   . Pancreatitis, gallstone   . Persistent atrial fibrillation (HCC) paf   CHADS2VASC score is 5  . PONV (postoperative nausea and vomiting)   . Pulmonary HTN ([place])    Moderate with PASP 41mmHg on echo 11/2016   . Renal cell cancer ([place])    pt. denies  . Tachy-brady syndrome ([place])    s/p PPM  . Thoracic aortic aneurysm ([place])    50mm on echo 11/2016, 4.2cm by chest CT angio 05/2017  . Urinary incontinence   . Wears glasses    reading  . Wears partial dentures    upper partial      Past Surgical History:  Procedure Laterality Date  . ABDOMINAL HYSTERECTOMY    . COLONOSCOPY    . LAPAROSCOPIC CHOLECYSTECTOMY  2007   lap choli  . left knee replacement - Dr. French Ana  2012   lt total knee  . PACEMAKER GENERATOR CHANGE  04/2013   MDT ADDRL1 pacemaker implanted by Dr Lovena Le  . PACEMAKER INSERTION  2007  . PERMANENT PACEMAKER GENERATOR CHANGE N/A 05/03/2013   Procedure: PERMANENT PACEMAKER GENERATOR CHANGE;  Surgeon:  Evans Lance, MD;  Location: [facility] C[facility] LAB;  Service: Cardiovascular;  Laterality: N/A;  . PROXIMAL INTERPHALANGEAL FUSION (PIP) Right 08/22/2013   Procedure: EXCISION MUCOID CYST DEBRIDEMENT PROXIMAL INTERPHALANGEAL JOINT;  Surgeon: Wynonia Sours, MD;  Location: [place];  Service: Orthopedics;  Laterality: Right;  . THUMB ARTHROSCOPY  2007   right  . TOTAL ABDOMINAL HYSTERECTOMY W/ BILATERAL SALPINGOOPHORECTOMY    . TOTAL KNEE ARTHROPLASTY Right 10/03/2015   Procedure: RIGHT TOTAL KNEE ARTHROPLASTY;  Surgeon: Earlie Server, MD;  Location: [place];  Service: Orthopedics;  Laterality: Right;  . TUBAL LIGATION    . VEIN LIGATION        Social History:     Social History   Tobacco Use  . Smoking status: Never Smoker  . Smokeless tobacco: Never Used  Substance Use Topics  . Alcohol use: No     Lives - at home  Mobility - walks with assitance    Family History :     Family History  Problem Relation Age of Onset  . Hypertension Other      Home Medications:   Prior to Admission medications   Medication Sig Start Date End Date Taking? Authorizing Provider  amiodarone (PACERONE) 200 MG tablet TAKE ONE TABLET BY MOUTH ONCE DAILY 12/01/16  Yes Evans Lance, MD  apixaban (ELIQUIS) 5 MG TABS tablet Take 1 tablet (5 mg total) by mouth 2 (two) times daily. 06/22/17  Yes Evans Lance, MD  CARTIA XT 240 MG 24 hr capsule TAKE ONE CAPSULE BY MOUTH ONCE DAILY 12/31/16  Yes Turner, Eber Hong, MD  fluticasone (FLONASE) 50 MCG/ACT nasal spray Place 2 sprays into both nostrils daily. Patient taking differently: Place 2 sprays into both nostrils daily as needed for allergies.  01/04/17  Yes Collene Gobble, MD  loratadine (CLARITIN) 10 MG tablet Take 10 mg by mouth daily as needed for allergies.   Yes [provider]  metoprolol succinate (TOPROL-XL) 25 MG 24 hr tablet TAKE ONE TABLET BY MOUTH ONCE DAILY 12/31/16  Yes Turner, Eber Hong, MD  OVER THE COUNTER MEDICATION  Take 1 tablet by mouth daily. codliver supplement takes daily 05/21/17  Yes [provider]  pantoprazole (PROTONIX) 40 MG tablet Take 1 tablet (40 mg total) by mouth 2 (two) times daily. 09/24/16  Yes Regalado, Belkys A, MD  polyethylene glycol (MIRALAX / GLYCOLAX) packet Take 17 g by mouth daily. Patient not taking: Reported on 07/08/2017 09/25/16   Elmarie Shiley, MD     Allergies:     Allergies  Allergen Reactions  . Hydrocodone     Excessive vomiting  . Sulfa Antibiotics Swelling  . Codeine Nausea And Vomiting     Physical Exam:   Vitals  Blood pressure (!) 145/65, pulse 68, temperature 98.3 F (36.8 C), temperature source Oral, resp. rate 17, height 5\' 5"  (1.651 m), weight 74.4 kg (164 lb 0.4 oz), SpO2 93 %.   1. General  lying in bed in NAD,   2. Normal affect and insight, Not Suicidal or Homicidal, Awake Alert, Oriented X 3.  3. No F.N deficits, ALL C.Nerves Intact, Strength 5/5 all 4 extremities, Sensation intact all 4 extremities, Plantars down going.  4. Ears and Eyes appear Normal, Conjunctivae clear, PERRLA. Moist Oral Mucosa.  5. Supple Neck, No JVD, No cervical lymphadenopathy appriciated, No Carotid Bruits.  6. Symmetrical Chest wall movement, crackles at left and right lung base, no wheezing.  7. RRR, No Gallops, Rubs or Murmurs, No Parasternal Heave.  8. Positive Bowel Sounds, Abdomen Soft, No tenderness, No organomegaly appriciated,No rebound -guarding or rigidity.  9.  No Cyanosis, Normal Skin Turgor, No Skin Rash or Bruise.  10. Good muscle tone,  joints appear normal , no effusions, Normal ROM.  11. No Palpable Lymph Nodes in Neck or Axillae      Data Review:    CBC Recent Labs  Lab 06/26/2017 2158  WBC 7.1  HGB 11.5*  HCT 32.0*  PLT 228  MCV 88.4  MCH 31.8  MCHC 35.9  RDW 15.3   ------------------------------------------------------------------------------------------------------------------  Chemistries  Recent Labs  Lab  07/06/2017 2158  NA 138  K 2.8*  CL 105  CO2 22  GLUCOSE 144*  BUN 14  CREATININE 0.68  CALCIUM 9.1  AST 35  ALT 23  ALKPHOS 125  BILITOT 1.1   ------------------------------------------------------------------------------------------------------------------ estimated creatinine clearance is 49.1 mL/min (by C-G formula based on SCr of 0.68 mg/dL). ------------------------------------------------------------------------------------------------------------------ No results for input(s): TSH, T4TOTAL, T3FREE, THYROIDAB in the last 72 hours.  Invalid input(s): FREET3  Coagulation profile No results for input(s): INR, PROTIME in the last 168 hours. ------------------------------------------------------------------------------------------------------------------- Recent Labs    06/22/2017 2309  DDIMER 1.17*   -------------------------------------------------------------------------------------------------------------------  Cardiac Enzymes No results for input(s): CKMB, TROPONINI, MYOGLOBIN in the last 168 hours.  Invalid input(s): CK ------------------------------------------------------------------------------------------------------------------    Component Value Date/Time   BNP 124.1 (H) 09/19/2016 2218     ---------------------------------------------------------------------------------------------------------------  Urinalysis    Component Value Date/Time   COLORURINE YELLOW 07/08/2017 0252   APPEARANCEUR CLEAR 07/08/2017 0252   LABSPEC 1.025 07/08/2017 0252   PHURINE 6.0 07/08/2017 0252   GLUCOSEU NEGATIVE 07/08/2017 0252   HGBUR TRACE (A) 07/08/2017 0252   BILIRUBINUR NEGATIVE 07/08/2017 0252   KETONESUR NEGATIVE 07/08/2017 0252   PROTEINUR 30 (A) 07/08/2017 0252   UROBILINOGEN 0.2 03/16/2011 1529   NITRITE NEGATIVE 07/08/2017 0252   LEUKOCYTESUR TRACE (A) 07/08/2017 0252     ----------------------------------------------------------------------------------------------------------------   Imaging Results:    Dg Chest 2 View  Result Date: 06/28/2017 CLINICAL DATA:  Bright red blood per rectum. EXAM: CHEST - 2 VIEW COMPARISON:  05/23/2017 CXR, 06/14/2017 chest CT FINDINGS: Cardiomegaly with aortic atherosclerosis, stable in appearance. Left-sided pacemaker apparatus with leads in the right atrium and right ventricle are noted. There is bibasilar atelectasis. Pulmonary  consolidation in the right upper lobe consistent with pneumonia is identified, a new finding since prior. Minimal blunting of the costophrenic angles may reflect tiny pleural effusions and/or atelectasis. No overt pulmonary edema. Degenerative changes are present along the dorsal spine. IMPRESSION: New right upper lobe pulmonary consolidation consistent with pneumonia. Bibasilar subsegmental atelectasis. Stable cardiomegaly with aortic atherosclerosis. Electronically Signed   By: Ashley Royalty M.D.   On: 06/25/2017 23:57   Ct Angio Chest Pe W And/or Wo Contrast  Result Date: 07/08/2017 CLINICAL DATA:  Subacute onset of bright red rectal bleeding. Atrial fibrillation. Mild hypoxia. Pneumonia. Elevated D-dimer. EXAM: CT ANGIOGRAPHY CHEST CT ABDOMEN AND PELVIS WITH CONTRAST TECHNIQUE: Multidetector CT imaging of the chest was performed using the standard protocol during bolus administration of intravenous contrast. Multiplanar CT image reconstructions and MIPs were obtained to evaluate the vascular anatomy. Multidetector CT imaging of the abdomen and pelvis was performed using the standard protocol during bolus administration of intravenous contrast. CONTRAST:  151mL ISOVUE-370 IOPAMIDOL (ISOVUE-370) INJECTION 76% COMPARISON:  CT of the abdomen performed 03/25/2005, and CTA of the chest performed 06/14/2017 FINDINGS: CTA CHEST FINDINGS Cardiovascular:  There is no evidence of pulmonary embolus. The heart is mildly  enlarged. Mild calcification is noted along the thoracic aorta. The great vessels are grossly unremarkable in appearance. A pacemaker is noted at the left chest wall, with leads ending at the right atrium and right ventricle. There is mild prominence of the ascending thoracic aorta to 4.3 cm in AP dimension, which is likely within physiologic limits for the patient's age. Mediastinum/Nodes: Mildly enlarged right hilar and mediastinal nodes are seen, measuring up to 1.2 cm in short axis. Prominent nodes are noted at the right paratracheal region, subcarinal region and azygoesophageal recess. No pericardial effusion is identified. The thyroid gland is unremarkable. No axillary lymphadenopathy is seen. Lungs/Pleura: Multifocal right-sided airspace opacification is noted, with some degree of crazy paving. Minimal opacity is also noted at the left lung apex. This more likely reflects pneumonia. Chronic bibasilar airspace opacities are again noted. No pleural effusion or pneumothorax is seen. Musculoskeletal: No acute osseous abnormalities are identified. The visualized musculature is unremarkable in appearance. Review of the MIP images confirms the above findings. CT ABDOMEN and PELVIS FINDINGS Hepatobiliary: Scattered nonspecific hypodensities within the liver measure up to 1.9 cm in size. The liver is otherwise unremarkable. The patient is status post cholecystectomy, with clips noted at the gallbladder fossa. The common bile duct is normal in caliber. Pancreas: The pancreas is within normal limits. Spleen: The spleen is unremarkable in appearance. Adrenals/Urinary Tract: The adrenal glands are unremarkable in appearance. A large left renal cyst is noted, with minimal peripheral calcification. There is no evidence of hydronephrosis. No renal or ureteral stones are identified. No perinephric stranding is seen. Stomach/Bowel: The stomach is unremarkable in appearance. The small bowel is within normal limits. The appendix  is normal in caliber, without evidence of appendicitis. The colon is unremarkable in appearance. Vascular/Lymphatic: Scattered calcification is seen along the abdominal aorta and its branches. The abdominal aorta is otherwise grossly unremarkable. The inferior vena cava is grossly unremarkable. No retroperitoneal lymphadenopathy is seen. No pelvic sidewall lymphadenopathy is identified. Reproductive: The bladder is mildly distended and grossly unremarkable. The patient is status post hysterectomy. No suspicious adnexal masses are seen. Other: No additional soft tissue abnormalities are seen. Musculoskeletal: No acute osseous abnormalities are identified. Multilevel vacuum phenomenon is noted along the lower lumbar spine. The visualized musculature is unremarkable in appearance. Review of  the MIP images confirms the above findings. IMPRESSION: 1. No evidence of pulmonary embolus. 2. No focal abnormality seen to explain the patient's rectal bleeding. 3. Multifocal right-sided airspace opacification, with underlying crazy paving. Minimal airspace opacity at the left lung apex. This more likely reflects multifocal pneumonia, new from the prior study. Underlying chronic opacities again noted. 4. Mildly enlarged right hilar and mediastinal nodes likely reflect the acute infection. 5. Mild cardiomegaly. 6. Large left renal cyst. Scattered nonspecific hypodensities within the liver may reflect cysts. Aortic Atherosclerosis (ICD10-I70.0). Electronically Signed   By: Garald Balding M.D.   On: 07/08/2017 03:03   Ct Abdomen Pelvis W Contrast  Result Date: 07/08/2017 CLINICAL DATA:  Subacute onset of bright red rectal bleeding. Atrial fibrillation. Mild hypoxia. Pneumonia. Elevated D-dimer. EXAM: CT ANGIOGRAPHY CHEST CT ABDOMEN AND PELVIS WITH CONTRAST TECHNIQUE: Multidetector CT imaging of the chest was performed using the standard protocol during bolus administration of intravenous contrast. Multiplanar CT image  reconstructions and MIPs were obtained to evaluate the vascular anatomy. Multidetector CT imaging of the abdomen and pelvis was performed using the standard protocol during bolus administration of intravenous contrast. CONTRAST:  119mL ISOVUE-370 IOPAMIDOL (ISOVUE-370) INJECTION 76% COMPARISON:  CT of the abdomen performed 03/25/2005, and CTA of the chest performed 06/14/2017 FINDINGS: CTA CHEST FINDINGS Cardiovascular:  There is no evidence of pulmonary embolus. The heart is mildly enlarged. Mild calcification is noted along the thoracic aorta. The great vessels are grossly unremarkable in appearance. A pacemaker is noted at the left chest wall, with leads ending at the right atrium and right ventricle. There is mild prominence of the ascending thoracic aorta to 4.3 cm in AP dimension, which is likely within physiologic limits for the patient's age. Mediastinum/Nodes: Mildly enlarged right hilar and mediastinal nodes are seen, measuring up to 1.2 cm in short axis. Prominent nodes are noted at the right paratracheal region, subcarinal region and azygoesophageal recess. No pericardial effusion is identified. The thyroid gland is unremarkable. No axillary lymphadenopathy is seen. Lungs/Pleura: Multifocal right-sided airspace opacification is noted, with some degree of crazy paving. Minimal opacity is also noted at the left lung apex. This more likely reflects pneumonia. Chronic bibasilar airspace opacities are again noted. No pleural effusion or pneumothorax is seen. Musculoskeletal: No acute osseous abnormalities are identified. The visualized musculature is unremarkable in appearance. Review of the MIP images confirms the above findings. CT ABDOMEN and PELVIS FINDINGS Hepatobiliary: Scattered nonspecific hypodensities within the liver measure up to 1.9 cm in size. The liver is otherwise unremarkable. The patient is status post cholecystectomy, with clips noted at the gallbladder fossa. The common bile duct is normal  in caliber. Pancreas: The pancreas is within normal limits. Spleen: The spleen is unremarkable in appearance. Adrenals/Urinary Tract: The adrenal glands are unremarkable in appearance. A large left renal cyst is noted, with minimal peripheral calcification. There is no evidence of hydronephrosis. No renal or ureteral stones are identified. No perinephric stranding is seen. Stomach/Bowel: The stomach is unremarkable in appearance. The small bowel is within normal limits. The appendix is normal in caliber, without evidence of appendicitis. The colon is unremarkable in appearance. Vascular/Lymphatic: Scattered calcification is seen along the abdominal aorta and its branches. The abdominal aorta is otherwise grossly unremarkable. The inferior vena cava is grossly unremarkable. No retroperitoneal lymphadenopathy is seen. No pelvic sidewall lymphadenopathy is identified. Reproductive: The bladder is mildly distended and grossly unremarkable. The patient is status post hysterectomy. No suspicious adnexal masses are seen. Other: No additional soft tissue  abnormalities are seen. Musculoskeletal: No acute osseous abnormalities are identified. Multilevel vacuum phenomenon is noted along the lower lumbar spine. The visualized musculature is unremarkable in appearance. Review of the MIP images confirms the above findings. IMPRESSION: 1. No evidence of pulmonary embolus. 2. No focal abnormality seen to explain the patient's rectal bleeding. 3. Multifocal right-sided airspace opacification, with underlying crazy paving. Minimal airspace opacity at the left lung apex. This more likely reflects multifocal pneumonia, new from the prior study. Underlying chronic opacities again noted. 4. Mildly enlarged right hilar and mediastinal nodes likely reflect the acute infection. 5. Mild cardiomegaly. 6. Large left renal cyst. Scattered nonspecific hypodensities within the liver may reflect cysts. Aortic Atherosclerosis (ICD10-I70.0).  Electronically Signed   By: Garald Balding M.D.   On: 07/08/2017 03:03      Assessment & Plan:    Principal Problem:   Pneumonia Active Problems:   Rectal bleeding   Anemia    CAP Blood culture x2 Urine legionella antigen Urine strep antigen Rocephin 1gm iv qday, zithromax 500mg  iv qday  Anemia, rectal bleeding Unusual that her FOBT negative Hgb not that low considering bleeding over the past 3-4 weeks  Protonix 80mg  iv x1 then 8mg /hr NPO  Except medication Please call [facility] in am to evaluate the patient Check cbc tomorrow am  Pafib Cont Cartia XT 240mg  po qday Cont Toprol XL 25mg  po qday Cont Eliquis Cont Amiodarone  Gerd Cont protonix    DVT Prophylaxis  SCDs  AM Labs Ordered, also please review Full Orders  Family Communication: Admission, patients condition and plan of care including tests being ordered have been discussed with the patient  who indicate understanding and agree with the plan and Code Status.  Code Status FULL CODE  Likely DC to  home  Condition GUARDED    Consults called: none, please call [facility] this am  Admission status: inpatient   Time spent in minutes : 45   Jani Gravel M.D on 07/08/2017 at 5:07 AM  Between 7am to 7pm - Pager - [phone]  . After 7pm go to [email or web address] - password [facility]  [facility] - Office  [phone]

## 2017-07-08 NOTE — Progress Notes (Signed)
Pt admitted from [facility]. Placed in bed and on telemetry. VSS. Pt arrived on 4L O2, with Sats at 93%. Family at bedside. Dr. Maudie Mercury paged to notify of pt arrival.

## 2017-07-09 DIAGNOSIS — K625 Hemorrhage of anus and rectum: Secondary | ICD-10-CM

## 2017-07-09 LAB — CBC WITH DIFFERENTIAL/PLATELET
BASOS ABS: 0 10*3/uL (ref 0.0–0.1)
BASOS PCT: 0 %
EOS ABS: 0.2 10*3/uL (ref 0.0–0.7)
EOS PCT: 3 %
HEMATOCRIT: 29 % — AB (ref 36.0–46.0)
Hemoglobin: 10 g/dL — ABNORMAL LOW (ref 12.0–15.0)
Lymphocytes Relative: 9 %
Lymphs Abs: 0.8 10*3/uL (ref 0.7–4.0)
MCH: 31 pg (ref 26.0–34.0)
MCHC: 34.5 g/dL (ref 30.0–36.0)
MCV: 89.8 fL (ref 78.0–100.0)
MONOS PCT: 10 %
Monocytes Absolute: 0.9 10*3/uL (ref 0.1–1.0)
NEUTROS ABS: 7 10*3/uL (ref 1.7–7.7)
Neutrophils Relative %: 78 %
PLATELETS: 196 10*3/uL (ref 150–400)
RBC: 3.23 MIL/uL — ABNORMAL LOW (ref 3.87–5.11)
RDW: 15.5 % (ref 11.5–15.5)
WBC: 8.9 10*3/uL (ref 4.0–10.5)

## 2017-07-09 LAB — BASIC METABOLIC PANEL
ANION GAP: 14 (ref 5–15)
BUN: 8 mg/dL (ref 6–20)
CALCIUM: 8.7 mg/dL — AB (ref 8.9–10.3)
CO2: 16 mmol/L — ABNORMAL LOW (ref 22–32)
CREATININE: 0.64 mg/dL (ref 0.44–1.00)
Chloride: 115 mmol/L — ABNORMAL HIGH (ref 101–111)
Glucose, Bld: 115 mg/dL — ABNORMAL HIGH (ref 65–99)
Potassium: 3.7 mmol/L (ref 3.5–5.1)
Sodium: 145 mmol/L (ref 135–145)

## 2017-07-09 LAB — HIV ANTIBODY (ROUTINE TESTING W REFLEX): HIV SCREEN 4TH GENERATION: NONREACTIVE

## 2017-07-09 MED ORDER — IPRATROPIUM-ALBUTEROL 0.5-2.5 (3) MG/3ML IN SOLN
3.0000 mL | Freq: Three times a day (TID) | RESPIRATORY_TRACT | Status: DC
  Administered 2017-07-09 – 2017-07-14 (×16): 3 mL via RESPIRATORY_TRACT
  Filled 2017-07-09 (×16): qty 3

## 2017-07-09 MED ORDER — PANTOPRAZOLE SODIUM 40 MG PO TBEC
40.0000 mg | DELAYED_RELEASE_TABLET | Freq: Two times a day (BID) | ORAL | Status: DC
  Administered 2017-07-09 – 2017-07-13 (×10): 40 mg via ORAL
  Filled 2017-07-09 (×11): qty 1

## 2017-07-09 MED ORDER — IPRATROPIUM-ALBUTEROL 0.5-2.5 (3) MG/3ML IN SOLN
3.0000 mL | Freq: Four times a day (QID) | RESPIRATORY_TRACT | Status: DC | PRN
  Administered 2017-07-09: 3 mL via RESPIRATORY_TRACT
  Filled 2017-07-09: qty 3

## 2017-07-09 MED ORDER — ACETAMINOPHEN 325 MG PO TABS
650.0000 mg | ORAL_TABLET | Freq: Four times a day (QID) | ORAL | Status: DC | PRN
  Administered 2017-07-09: 650 mg via ORAL
  Filled 2017-07-09: qty 2

## 2017-07-09 NOTE — Progress Notes (Signed)
Notified Bodenheimer, NP of pacemaker undersensing, pt asymptomatic. EKG order received.

## 2017-07-09 NOTE — Progress Notes (Signed)
Bodenheimer, NP notified of decreased O2 sats, continued pacemaker undersensing, and temp of 101.3. Orders for duonebs and tylenol received. Bodenheimer stated he would notify day team of possible need to interrogate pacemaker.

## 2017-07-09 NOTE — Progress Notes (Signed)
PROGRESS NOTE  Molly Taylor:[id number] DOB: [birth year] DOA: 07/12/2017 PCP: Alroy Dust, L.Marlou Sa, MD  HPI/Recap of past 24 hours: Molly Taylor  is a 84 y.o. female with history of hypertension, hyperlipidemia, thoracic aortic aneurysm, tachy-brady, P.afib on eliquis, GERD presents to the ED, c/o worsening productive cough, SOB, fatigue for the past 3 weeks, denies any fever/chills, chest pain. Pt also reported having black stools about several weeks ago, which have now resolved. Over the past week, has had intermittent brbpr mixed with stool. Pt denies abdominal pain, N/V, diarrhea/constipation and then her stool turned brown and over the past 1 week she has had slight brbpr. Pt denies fever, chills, n/v,  abd pain, diarrhea, constipation. In the ED pt noted to be hypoxic on RA with sats in the 80s, FOBT was noted to be negative, CTA chest done showed multifocal PNA, no PE noted. Pt admitted for further management.  Today, met pt in bed eating her breakfast, still c/o cough. Denies any further BRBPR/melena, no fever/chills, chest pain, worsening SOB, abdominal pain   Assessment/Plan: Principal Problem:   Community acquired pneumonia of right upper lobe of lung ([place]) Active Problems:   Rectal bleeding   Anemia  Acute hypoxic respiratory failure 2/2 multifocal PNA Pt now requiring venturi mask Spiked a temp of 101.3 on 4/20, no leukocytosis BC X 2 pending Urine strep pneumo negative, legionella pending CT chest showed: multifocal PNA, no PE Continue IV Rocephin, Azithromycin Monitor closely  Lower GI bleed Hx of intermittent BRBPR, none noted since admission Hgb remained stable since admission, FOBT negative CT abd/pelvis done in ER showed no focal abnormality seen to explain the patient's rectal bleeding Will consult GI if bleeding noted/drop in hemoglobin Continue PPI  Acute blood loss anemia Likely due to above Hgb baseline around 11-12, noted 11.5 on admission Daily  CBC  Hypokalemia Replace prn  P. Afib Paced rhythm, rate controlled Continue amiodarone, diltiazem, metoprolol Held eliquis due to ?GIB, will restart soon  HTN Stable Continue home meds  GERD PPI     Code Status: Full  Family Communication: None at bedside  Disposition Plan: Home once dyspnea improves   Consultants:  None  Procedures:  None  Antimicrobials:  IV Rocephin  Azithromycin  DVT prophylaxis:  Held eliquis   Objective: Vitals:   07/09/17 0800 07/09/17 0952 07/09/17 1000 07/09/17 1200  BP: 117/90  (!) 120/53 (!) 141/63  Pulse: 63  60 61  Resp: 18  (!) 30 (!) 24  Temp: 98.4 F (36.9 C)   98.4 F (36.9 C)  TempSrc: Oral   Axillary  SpO2: 91% 92% 92% 95%  Weight:      Height:        Intake/Output Summary (Last 24 hours) at 07/09/2017 1249 Last data filed at 07/09/2017 1100 Gross per 24 hour  Intake 1255 ml  Output 850 ml  Net 405 ml   Filed Weights   07/13/2017 2003 07/08/17 0344  Weight: 72.6 kg (160 lb) 74.4 kg (164 lb 0.4 oz)    Exam:   General: NAD   Cardiovascular: S1, S2 present  Respiratory: Bibasilar crackles noted  Abdomen: Soft, NT, ND, BS present  Musculoskeletal: No pedal edema bilaterally  Skin: Normal  Psychiatry: Normal mood   Data Reviewed: CBC: Recent Labs  Lab 07/03/2017 2158 07/08/17 0701 07/08/17 1312 07/09/17 0305  WBC 7.1 7.4  --  8.9  NEUTROABS  --   --   --  7.0  HGB 11.5* 9.9* 10.0* 10.0*  HCT 32.0* 28.4* 28.6* 29.0*  MCV 88.4 88.5  --  89.8  PLT 228 205  --  177   Basic Metabolic Panel: Recent Labs  Lab 06/27/2017 2158 07/09/17 0305  NA 138 145  K 2.8* 3.7  CL 105 115*  CO2 22 16*  GLUCOSE 144* 115*  BUN 14 8  CREATININE 0.68 0.64  CALCIUM 9.1 8.7*   GFR: Estimated Creatinine Clearance: 49.1 mL/min (by C-G formula based on SCr of 0.64 mg/dL). Liver Function Tests: Recent Labs  Lab 06/27/2017 2158  AST 35  ALT 23  ALKPHOS 125  BILITOT 1.1  PROT 7.1  ALBUMIN 3.0*   No  results for input(s): LIPASE, AMYLASE in the last 168 hours. No results for input(s): AMMONIA in the last 168 hours. Coagulation Profile: No results for input(s): INR, PROTIME in the last 168 hours. Cardiac Enzymes: No results for input(s): CKTOTAL, CKMB, CKMBINDEX, TROPONINI in the last 168 hours. BNP (last 3 results) No results for input(s): PROBNP in the last 8760 hours. HbA1C: No results for input(s): HGBA1C in the last 72 hours. CBG: No results for input(s): GLUCAP in the last 168 hours. Lipid Profile: No results for input(s): CHOL, HDL, LDLCALC, TRIG, CHOLHDL, LDLDIRECT in the last 72 hours. Thyroid Function Tests: No results for input(s): TSH, T4TOTAL, FREET4, T3FREE, THYROIDAB in the last 72 hours. Anemia Panel: No results for input(s): VITAMINB12, FOLATE, FERRITIN, TIBC, IRON, RETICCTPCT in the last 72 hours. Urine analysis:    Component Value Date/Time   COLORURINE YELLOW 07/08/2017 0252   APPEARANCEUR CLEAR 07/08/2017 0252   LABSPEC 1.025 07/08/2017 0252   PHURINE 6.0 07/08/2017 0252   GLUCOSEU NEGATIVE 07/08/2017 0252   HGBUR TRACE (A) 07/08/2017 0252   BILIRUBINUR NEGATIVE 07/08/2017 0252   KETONESUR NEGATIVE 07/08/2017 0252   PROTEINUR 30 (A) 07/08/2017 0252   UROBILINOGEN 0.2 03/16/2011 1529   NITRITE NEGATIVE 07/08/2017 0252   LEUKOCYTESUR TRACE (A) 07/08/2017 0252   Sepsis Labs: @LABRCNTIP (procalcitonin:4,lacticidven:4)  ) Recent Results (from the past 240 hour(s))  MRSA PCR Screening     Status: None   Collection Time: 07/08/17  3:38 AM  Result Value Ref Range Status   MRSA by PCR NEGATIVE NEGATIVE Final    Comment:        The GeneXpert MRSA Assay (FDA approved for NASAL specimens only), is one component of a comprehensive MRSA colonization surveillance program. It is not intended to diagnose MRSA infection nor to guide or monitor treatment for MRSA infections. Performed at [facility], [place] [address] [place], [place]  [address]   Culture, sputum-assessment     Status: None   Collection Time: 07/08/17 11:38 AM  Result Value Ref Range Status   Specimen Description SPUTUM  Final   Special Requests Normal  Final   Sputum evaluation   Final    Sputum specimen not acceptable for testing.  Please recollect.   Performed at [facility], [place] [address] [place],  [address]    Report Status 07/08/2017 FINAL  Final      Studies: No results found.  Scheduled Meds: . amiodarone  200 mg Oral Daily  . diltiazem  240 mg Oral Daily  . feeding supplement (ENSURE ENLIVE)  237 mL Oral BID BM  . ipratropium-albuterol  3 mL Nebulization TID  . mouth rinse  15 mL Mouth Rinse BID  . metoprolol succinate  25 mg Oral Daily  . pantoprazole  40 mg Oral BID    Continuous Infusions: .  azithromycin Stopped (07/09/17 0116)  . cefTRIAXone (ROCEPHIN)  IV Stopped (07/08/17 2202)     LOS: 1 day     Alma Friendly, MD [facility]   If 7PM-7AM, please contact night-coverage [email or web address] Password [facility] 07/09/2017, 12:49 PM

## 2017-07-10 ENCOUNTER — Inpatient Hospital Stay (HOSPITAL_COMMUNITY): Payer: Medicare Other

## 2017-07-10 LAB — RESPIRATORY PANEL BY PCR
Adenovirus: NOT DETECTED
BORDETELLA PERTUSSIS-RVPCR: NOT DETECTED
CORONAVIRUS 229E-RVPPCR: NOT DETECTED
Chlamydophila pneumoniae: NOT DETECTED
Coronavirus HKU1: NOT DETECTED
Coronavirus NL63: NOT DETECTED
Coronavirus OC43: NOT DETECTED
INFLUENZA B-RVPPCR: NOT DETECTED
Influenza A: NOT DETECTED
METAPNEUMOVIRUS-RVPPCR: NOT DETECTED
Mycoplasma pneumoniae: NOT DETECTED
PARAINFLUENZA VIRUS 1-RVPPCR: NOT DETECTED
PARAINFLUENZA VIRUS 2-RVPPCR: NOT DETECTED
PARAINFLUENZA VIRUS 3-RVPPCR: NOT DETECTED
PARAINFLUENZA VIRUS 4-RVPPCR: NOT DETECTED
RESPIRATORY SYNCYTIAL VIRUS-RVPPCR: NOT DETECTED
RHINOVIRUS / ENTEROVIRUS - RVPPCR: NOT DETECTED

## 2017-07-10 LAB — CBC WITH DIFFERENTIAL/PLATELET
Basophils Absolute: 0 10*3/uL (ref 0.0–0.1)
Basophils Relative: 0 %
Eosinophils Absolute: 0.2 10*3/uL (ref 0.0–0.7)
Eosinophils Relative: 1 %
HCT: 32.6 % — ABNORMAL LOW (ref 36.0–46.0)
HEMOGLOBIN: 11.1 g/dL — AB (ref 12.0–15.0)
LYMPHS ABS: 0.7 10*3/uL (ref 0.7–4.0)
LYMPHS PCT: 6 %
MCH: 30.9 pg (ref 26.0–34.0)
MCHC: 34 g/dL (ref 30.0–36.0)
MCV: 90.8 fL (ref 78.0–100.0)
Monocytes Absolute: 1 10*3/uL (ref 0.1–1.0)
Monocytes Relative: 9 %
NEUTROS ABS: 9.3 10*3/uL — AB (ref 1.7–7.7)
NEUTROS PCT: 84 %
PLATELETS: 240 10*3/uL (ref 150–400)
RBC: 3.59 MIL/uL — AB (ref 3.87–5.11)
RDW: 16 % — ABNORMAL HIGH (ref 11.5–15.5)
WBC: 11.2 10*3/uL — AB (ref 4.0–10.5)

## 2017-07-10 LAB — BASIC METABOLIC PANEL
Anion gap: 10 (ref 5–15)
BUN: 11 mg/dL (ref 6–20)
CHLORIDE: 110 mmol/L (ref 101–111)
CO2: 21 mmol/L — ABNORMAL LOW (ref 22–32)
Calcium: 8.8 mg/dL — ABNORMAL LOW (ref 8.9–10.3)
Creatinine, Ser: 0.66 mg/dL (ref 0.44–1.00)
GFR calc Af Amer: >60 mL/min (ref >60)
GLUCOSE: 128 mg/dL — AB (ref 65–99)
POTASSIUM: 3.5 mmol/L (ref 3.5–5.1)
SODIUM: 141 mmol/L (ref 135–145)

## 2017-07-10 LAB — LEGIONELLA PNEUMOPHILA SEROGP 1 UR AG: L. pneumophila Serogp 1 Ur Ag: NEGATIVE

## 2017-07-10 LAB — BLOOD GAS, ARTERIAL
Acid-Base Excess: 0.2 mmol/L (ref 0.0–2.0)
BICARBONATE: 22.8 mmol/L (ref 20.0–28.0)
Drawn by: 295031
FIO2: 55
O2 Saturation: 88.6 %
PCO2 ART: 32.3 mmHg (ref 32.0–48.0)
PH ART: 7.462 — AB (ref 7.350–7.450)
PO2 ART: 54.3 mmHg — AB (ref 83.0–108.0)
Patient temperature: 98.6

## 2017-07-10 LAB — URINALYSIS, ROUTINE W REFLEX MICROSCOPIC
BILIRUBIN URINE: NEGATIVE
GLUCOSE, UA: NEGATIVE mg/dL
KETONES UR: NEGATIVE mg/dL
Nitrite: NEGATIVE
PH: 5 (ref 5.0–8.0)
Specific Gravity, Urine: 1.019 (ref 1.005–1.030)

## 2017-07-10 LAB — PROCALCITONIN: Procalcitonin: <0.1 ng/mL

## 2017-07-10 LAB — LACTIC ACID, PLASMA: Lactic Acid, Venous: 1.1 mmol/L (ref 0.5–1.9)

## 2017-07-10 MED ORDER — SODIUM CHLORIDE 0.9 % IV SOLN
1.0000 g | Freq: Three times a day (TID) | INTRAVENOUS | Status: DC
  Administered 2017-07-10 – 2017-07-12 (×5): 1 g via INTRAVENOUS
  Filled 2017-07-10 (×7): qty 1

## 2017-07-10 MED ORDER — SODIUM CHLORIDE 0.9 % IV SOLN
2.0000 g | Freq: Once | INTRAVENOUS | Status: AC
  Administered 2017-07-10: 2 g via INTRAVENOUS
  Filled 2017-07-10: qty 2

## 2017-07-10 MED ORDER — APIXABAN 5 MG PO TABS
5.0000 mg | ORAL_TABLET | Freq: Two times a day (BID) | ORAL | Status: DC
  Administered 2017-07-10 – 2017-07-13 (×8): 5 mg via ORAL
  Filled 2017-07-10 (×9): qty 1

## 2017-07-10 MED ORDER — ACETYLCYSTEINE 20 % IN SOLN
2.0000 mL | Freq: Three times a day (TID) | RESPIRATORY_TRACT | Status: DC
  Administered 2017-07-10: 4 mL via RESPIRATORY_TRACT
  Administered 2017-07-10 – 2017-07-11 (×4): 2 mL via RESPIRATORY_TRACT
  Filled 2017-07-10 (×7): qty 4

## 2017-07-10 MED ORDER — SODIUM CHLORIDE 0.9 % IV SOLN
2.0000 g | Freq: Once | INTRAVENOUS | Status: DC
  Filled 2017-07-10: qty 2

## 2017-07-10 NOTE — Progress Notes (Signed)
Pharmacy has been contacted to reschedule antibiotic doses due to loss of IV access.

## 2017-07-10 NOTE — Progress Notes (Signed)
Pharmacy Antibiotic Note  Molly Taylor is a 84 y.o. female admitted on 06/25/2017 with CAP. Patient had received 2 days of Rocephin/Zithromax when she became hypoxic early this AM. Pharmacy has been consulted for cefepime dosing for sepsis with likely pulmonary or urinary source.  Plan:  Cefepime 2g IV x 1, then 1 g IV q8 hr  Borderline CrCl, f/u for renal adjustments   Height: 5\' 5"  (165.1 cm) Weight: 173 lb 4.5 oz (78.6 kg) IBW/kg (Calculated) : 57  Temp (24hrs), Avg:99.3 F (37.4 C), Min:98.4 F (36.9 C), Max:100.8 F (38.2 C)  Recent Labs  Lab 06/29/2017 2158 07/08/17 0108 07/08/17 0701 07/09/17 0305 07/10/17 0336  WBC 7.1  --  7.4 8.9 11.2*  CREATININE 0.68  --   --  0.64 0.66  LATICACIDVEN  --  1.06  --   --   --     Estimated Creatinine Clearance: 50.3 mL/min (by C-G formula based on SCr of 0.66 mg/dL).    Allergies  Allergen Reactions  . Hydrocodone     Excessive vomiting  . Sulfa Antibiotics Swelling  . Codeine Nausea And Vomiting     Thank you for allowing pharmacy to be a part of this patient's care.  Reuel Boom, PharmD, BCPS [phone] 07/10/2017, 9:41 AM

## 2017-07-10 NOTE — Plan of Care (Signed)
Patient required non re-breather to kept sats above 90% later in night.  Stated her using and incentive spirometer and flutter valve to help clear some of the congestion.  Changed her back to veni-mask at 55% at end of shift and currently maintaining sats at 90%.  Patient may need additional chest physiotherapy to clear congestion.

## 2017-07-10 NOTE — Progress Notes (Addendum)
PROGRESS NOTE  Molly Taylor [facility]:[id number] DOB: [birth year] DOA: 07/16/2017 PCP: Alroy Dust, L.Marlou Sa, MD  HPI/Recap of past 24 hours: Molly Taylor  is a 84 y.o. female with history of hypertension, hyperlipidemia, thoracic aortic aneurysm, tachy-brady, P.afib on eliquis, GERD presents to the ED, c/o worsening productive cough, SOB, fatigue for the past 3 weeks, denies any fever/chills, chest pain. Pt also reported having black stools about several weeks ago, which have now resolved. Over the past week, has had intermittent brbpr mixed with stool. Pt denies abdominal pain, N/V, diarrhea/constipation and then her stool turned brown and over the past 1 week she has had slight brbpr. Pt denies fever, chills, n/v,  abd pain, diarrhea, constipation. In the ED pt noted to be hypoxic on RA with sats in the 80s, FOBT was noted to be negative, CTA chest done showed multifocal PNA, no PE noted. Pt admitted for further management.  Overnight, pt required non-rebreather & Venti-mask to keep her sats above 90%. Today, pt still on venti-mask, still dyspneic with fatigue. Noted to have fever last night. Denies any further BRBPR/melena, chest pain,  abdominal pain   Assessment/Plan: Principal Problem:   Community acquired pneumonia of right upper lobe of lung ([place]) Active Problems:   Rectal bleeding   Anemia  Sepsis likely due to multifocal CAP Last temp 100.8 on 07/09/17, with leukocytosis LA 1.1, procalcitonin <0.10 CXR with worsening PNA BC X 2 NGTD, repeat pending UA/UC pending Started IV Cefepime, continue azithromycin. MRSA PCR neg Monitor closely  Acute hypoxic respiratory failure 2/2 multifocal PNA Still requiring venturi mask ABG showed hypoxia CT chest showed: multifocal PNA, no PE Repeat CXR: showed worsening PNA Respiratory panel pending Urine strep pneumo negative, legionella negative Continue management as above  Lower GI bleed Hx of intermittent BRBPR, none noted since  admission Hgb remained stable since admission, FOBT negative CT abd/pelvis done in ER showed no focal abnormality seen to explain the patient's rectal bleeding Will consult GI if bleeding noted/drop in hemoglobin Continue PPI, restarted home eliquis  Acute blood loss anemia Likely due to above Hgb baseline around 11-12, noted 11.5 on admission Daily CBC  Hypokalemia Replace prn  P. Afib Paced rhythm, rate controlled Continue amiodarone, diltiazem, metoprolol Restarted eliquis  HTN Stable Continue home meds  GERD PPI     Code Status: Full  Family Communication: None at bedside  Disposition Plan: SNF   Consultants:  None  Procedures:  None  Antimicrobials:  IV Cefepime  Azithromycin  DVT prophylaxis:  Eliquis   Objective: Vitals:   07/10/17 1017 07/10/17 1021 07/10/17 1200 07/10/17 1309  BP:  (!) 161/62 (!) 150/56   Pulse: 67 66 75 83  Resp: (!) 32  (!) 30 (!) 22  Temp:   98.6 F (37 C)   TempSrc:   Oral   SpO2: 93%  (!) 88% 90%  Weight:      Height:        Intake/Output Summary (Last 24 hours) at 07/10/2017 1335 Last data filed at 07/10/2017 0600 Gross per 24 hour  Intake 1020 ml  Output 750 ml  Net 270 ml   Filed Weights   07/12/2017 2003 07/08/17 0344 07/10/17 0414  Weight: 72.6 kg (160 lb) 74.4 kg (164 lb 0.4 oz) 78.6 kg (173 lb 4.5 oz)    Exam:   General: NAD   Cardiovascular: S1, S2 present  Respiratory: Bibasilar crackles noted  Abdomen: Soft, NT, ND, BS present  Musculoskeletal: No pedal edema bilaterally  Skin: Normal  Psychiatry: Normal mood   Data Reviewed: CBC: Recent Labs  Lab 07/15/2017 2158 07/08/17 0701 07/08/17 1312 07/09/17 0305 07/10/17 0336  WBC 7.1 7.4  --  8.9 11.2*  NEUTROABS  --   --   --  7.0 9.3*  HGB 11.5* 9.9* 10.0* 10.0* 11.1*  HCT 32.0* 28.4* 28.6* 29.0* 32.6*  MCV 88.4 88.5  --  89.8 90.8  PLT 228 205  --  196 361   Basic Metabolic Panel: Recent Labs  Lab 07/13/2017 2158  07/09/17 0305 07/10/17 0336  NA 138 145 141  K 2.8* 3.7 3.5  CL 105 115* 110  CO2 22 16* 21*  GLUCOSE 144* 115* 128*  BUN 14 8 11   CREATININE 0.68 0.64 0.66  CALCIUM 9.1 8.7* 8.8*   GFR: Estimated Creatinine Clearance: 50.3 mL/min (by C-G formula based on SCr of 0.66 mg/dL). Liver Function Tests: Recent Labs  Lab 07/06/2017 2158  AST 35  ALT 23  ALKPHOS 125  BILITOT 1.1  PROT 7.1  ALBUMIN 3.0*   No results for input(s): LIPASE, AMYLASE in the last 168 hours. No results for input(s): AMMONIA in the last 168 hours. Coagulation Profile: No results for input(s): INR, PROTIME in the last 168 hours. Cardiac Enzymes: No results for input(s): CKTOTAL, CKMB, CKMBINDEX, TROPONINI in the last 168 hours. BNP (last 3 results) No results for input(s): PROBNP in the last 8760 hours. HbA1C: No results for input(s): HGBA1C in the last 72 hours. CBG: No results for input(s): GLUCAP in the last 168 hours. Lipid Profile: No results for input(s): CHOL, HDL, LDLCALC, TRIG, CHOLHDL, LDLDIRECT in the last 72 hours. Thyroid Function Tests: No results for input(s): TSH, T4TOTAL, FREET4, T3FREE, THYROIDAB in the last 72 hours. Anemia Panel: No results for input(s): VITAMINB12, FOLATE, FERRITIN, TIBC, IRON, RETICCTPCT in the last 72 hours. Urine analysis:    Component Value Date/Time   COLORURINE YELLOW 07/08/2017 0252   APPEARANCEUR CLEAR 07/08/2017 0252   LABSPEC 1.025 07/08/2017 0252   PHURINE 6.0 07/08/2017 0252   GLUCOSEU NEGATIVE 07/08/2017 0252   HGBUR TRACE (A) 07/08/2017 0252   BILIRUBINUR NEGATIVE 07/08/2017 0252   KETONESUR NEGATIVE 07/08/2017 0252   PROTEINUR 30 (A) 07/08/2017 0252   UROBILINOGEN 0.2 03/16/2011 1529   NITRITE NEGATIVE 07/08/2017 0252   LEUKOCYTESUR TRACE (A) 07/08/2017 0252   Sepsis Labs: @LABRCNTIP (procalcitonin:4,lacticidven:4)  ) Recent Results (from the past 240 hour(s))  Blood culture (routine x 2)     Status: None (Preliminary result)   Collection  Time: 07/08/17  1:10 AM  Result Value Ref Range Status   Specimen Description   Final    BLOOD LEFT ARM Performed at [facility], [address] [place], [place] [address]    Special Requests   Final    BOTTLES DRAWN AEROBIC AND ANAEROBIC Blood Culture adequate volume Performed at [facility], [address] [place], [place] [address]    Culture   Final    NO GROWTH 1 DAY Performed at [facility], [place] [address] [place], [place] [address]    Report Status PENDING  Incomplete  Blood culture (routine x 2)     Status: None (Preliminary result)   Collection Time: 07/08/17  1:33 AM  Result Value Ref Range Status   Specimen Description   Final    BLOOD LEFT ARM Performed at [facility], [address] [place], [place] [address]    Special Requests   Final  BOTTLES DRAWN AEROBIC AND ANAEROBIC Blood Culture adequate volume Performed at [facility], [place] [place], [place] [address]    Culture   Final    NO GROWTH 1 DAY Performed at [facility], [place] [address] [place], [place] [address]    Report Status PENDING  Incomplete  MRSA PCR Screening     Status: None   Collection Time: 07/08/17  3:38 AM  Result Value Ref Range Status   MRSA by PCR NEGATIVE NEGATIVE Final    Comment:        The GeneXpert MRSA Assay (FDA approved for NASAL specimens only), is one component of a comprehensive MRSA colonization surveillance program. It is not intended to diagnose MRSA infection nor to guide or monitor treatment for MRSA infections. Performed at [facility], [place] [address], [place], [place] [address]   Culture, sputum-assessment     Status: None   Collection Time: 07/08/17 11:38 AM  Result Value Ref Range Status   Specimen Description SPUTUM  Final   Special Requests Normal  Final   Sputum evaluation   Final    Sputum specimen not acceptable for testing.  Please recollect.   Performed at  [facility], [place] [address], [place], [place] [address]    Report Status 07/08/2017 FINAL  Final      Studies: Dg Chest Port 1 View  Result Date: 07/10/2017 CLINICAL DATA:  Hypoxia.  Hypertension. EXAM: PORTABLE CHEST 1 VIEW COMPARISON:  CT of 07/08/2017.  Plain film 06/25/2017. FINDINGS: Dual lead pacer. Midline trachea. Cardiomegaly accentuated by AP portable technique. Atherosclerosis in the transverse aorta. Probable small right pleural effusion. Worsened right upper lobe airspace disease. Bibasilar scarring. No congestive failure. IMPRESSION: Worsened right upper lobe airspace disease, consistent with pneumonia. Aortic Atherosclerosis (ICD10-I70.0). Electronically Signed   By: Abigail Miyamoto M.D.   On: 07/10/2017 07:54    Scheduled Meds: . acetylcysteine  2 mL Nebulization TID  . amiodarone  200 mg Oral Daily  . apixaban  5 mg Oral BID  . diltiazem  240 mg Oral Daily  . feeding supplement (ENSURE ENLIVE)  237 mL Oral BID BM  . ipratropium-albuterol  3 mL Nebulization TID  . mouth rinse  15 mL Mouth Rinse BID  . metoprolol succinate  25 mg Oral Daily  . pantoprazole  40 mg Oral BID    Continuous Infusions: . azithromycin Stopped (07/10/17 0300)  . ceFEPime (MAXIPIME) IV    . ceFEPime (MAXIPIME) IV       LOS: 2 days     Alma Friendly, MD [facility]   If 7PM-7AM, please contact night-coverage [email or web address] Password [email or web address] 07/10/2017, 1:35 PM

## 2017-07-10 NOTE — Evaluation (Signed)
Occupational Therapy Evaluation Patient Details Name: Molly Taylor MRN: [id number] DOB: Dec 16, 1926 Today's Date: 07/10/2017    History of Present Illness 84 yo female admitted with black stool and PNA. PMH: HTN, hyperlipidemia, thoracic aortic aneurysm, tachy brady afib Gerd   Clinical Impression   PT admitted with PNA and rectal bleeding. Pt currently with functional limitiations due to the deficits listed below (see OT problem list). Pt currently requires min (A) for basic stand pivot transfer and mod (A) for LB adls. Pt DOE 3 out 4 on Nasal cannula. Pt with saturations 88% or above during session on venti mask and 93% on [place] sitting in chair. RN present during session.  Pt will benefit from skilled OT to increase their independence and safety with adls and balance to allow discharge SNF. Pt currently lives alone and daughters will have to give MIN (A) and 24/7 (A) staying with patient to progress to home level at this time.      Follow Up Recommendations  SNF    Equipment Recommendations  3 in 1 bedside commode;Other (comment)(RW)    Recommendations for Other Services       Precautions / Restrictions Precautions Precautions: Fall Precaution Comments: watch BP and oxygen during session       Mobility Bed Mobility Overal bed mobility: Needs Assistance Bed Mobility: Supine to Sit     Supine to sit: Min assist     General bed mobility comments: HOB elevated and bed rail used. pt with (A) to elevate trunk and to scoot to EOB. pt able to power up with hand held (A )  Transfers Overall transfer level: Needs assistance Equipment used: 1 person hand held assist Transfers: Sit to/from Stand Sit to Stand: Min assist         General transfer comment: pt able to transfer EOB to 3n1    Balance Overall balance assessment: Needs assistance Sitting-balance support: Bilateral upper extremity supported;Feet supported Sitting balance-Leahy Scale: Fair     Standing balance  support: Single extremity supported;During functional activity Standing balance-Leahy Scale: Poor Standing balance comment: pt relies on UE support in standing for static balance                           ADL either performed or assessed with clinical judgement   ADL Overall ADL's : Needs assistance/impaired Eating/Feeding: Set up Eating/Feeding Details (indicate cue type and reason): declining food from breakfast. pt states "i have no appetite right now" pt encouraged to take a few bites at each meal Grooming: Wash/dry hands;Wash/dry face;Set up Grooming Details (indicate cue type and reason): pt requires seated position but able to remove Venti mask initially to wipe mouth and blow nose. pt switched by RN to [place]     Lower Body Bathing: Total assistance Lower Body Bathing Details (indicate cue type and reason): pt able to static stand with hand held (A) for peri care     Lower Body Dressing: Moderate assistance Lower Body Dressing Details (indicate cue type and reason): pt able to help push mesh panties downa nd cross. pt needed help to thread on mesh panties due to SCD on bil LE Toilet Transfer: Minimal assistance;Stand-pivot Toilet Transfer Details (indicate cue type and reason): pt transfer to 3n1 at bedside due to venti mask tubing length at this time and oxgyen saturation remained 88% during transfer Toileting- Clothing Manipulation and Hygiene: Total assistance Toileting - Clothing Manipulation Details (indicate cue type and reason): pt voiding  stool- RN present to document color        General ADL Comments: pt requesting to use 3n1 on arrival. pt complete 3n1 transfer and then full LB adl with change of mesh panties. pt able to cross bil LE to (A) with task     Vision         Perception     Praxis      Pertinent Vitals/Pain Pain Assessment: No/denies pain     Hand Dominance Right   Extremity/Trunk Assessment Upper Extremity Assessment Upper Extremity  Assessment: Generalized weakness   Lower Extremity Assessment Lower Extremity Assessment: Defer to PT evaluation   Cervical / Trunk Assessment Cervical / Trunk Assessment: Kyphotic   Communication Communication Communication: No difficulties   Cognition Arousal/Alertness: Awake/alert Behavior During Therapy: WFL for tasks assessed/performed Overall Cognitive Status: Within Functional Limits for tasks assessed                                     General Comments  93% on 6 L nasal cannula    Exercises     Shoulder Instructions      Home Living Family/patient expects to be discharged to:: Private residence Living Arrangements: Alone Available Help at Discharge: Family;Available PRN/intermittently(daughters) Type of Home: House Home Access: Stairs to enter [employer] of Steps: 2   Home Layout: One level         Biochemist, clinical: Standard     Home Equipment: None   Additional Comments: pt with recent spouse death ( reports 3 weeks ago) pt has x2 daughters that she verbalized live local and can (A) her      Prior Functioning/Environment Level of Independence: Independent        Comments: reports driving on occasion but does not liek to drive        OT Problem List: Decreased activity tolerance;Decreased knowledge of precautions;Decreased knowledge of use of DME or AE;Decreased safety awareness;Decreased strength;Impaired balance (sitting and/or standing);Cardiopulmonary status limiting activity      OT Treatment/Interventions: Self-care/ADL training;Therapeutic exercise;Energy conservation;DME and/or AE instruction;Therapeutic activities;Patient/family education;Balance training    OT Goals(Current goals can be found in the care plan section) Acute Rehab OT Goals Patient Stated Goal: none stated at this time - pt states "i can't think of any"  OT Goal Formulation: With patient Time For Goal Achievement: 07/24/17 Potential to Achieve  Goals: Good  OT Frequency: Min 2X/week   Barriers to D/C: Decreased caregiver support  pt lives alone with daughter able to (A) PRN       Co-evaluation              AM-PAC PT "6 Clicks" Daily Activity     Outcome Measure Help from another person eating meals?: None Help from another person taking care of personal grooming?: A Little Help from another person toileting, which includes using toliet, bedpan, or urinal?: A Little Help from another person bathing (including washing, rinsing, drying)?: A Little Help from another person to put on and taking off regular upper body clothing?: A Little Help from another person to put on and taking off regular lower body clothing?: A Lot 6 Click Score: 18   End of Session Equipment Utilized During Treatment: Oxygen Nurse Communication: Mobility status;Precautions  Activity Tolerance: Patient tolerated treatment well Patient left: in chair;with call bell/phone within reach;with nursing/sitter in room  OT Visit Diagnosis: Unsteadiness on feet (R26.81);Muscle weakness (generalized) (M62.81)  Time: 5784-6962 OT Time Calculation (min): 31 min Charges:  OT General Charges $OT Visit: 1 Visit OT Evaluation $OT Eval Moderate Complexity: 1 Mod G-Codes:      Sharna, Gabrys   OTR/L Pager: [phone] Office: [phone] .   Parke Poisson B 07/10/2017, 10:29 AM

## 2017-07-10 NOTE — Progress Notes (Signed)
Unsuccessful PIV attempts. IV team consulted for insertion.  Will continue to monitor.

## 2017-07-10 NOTE — Evaluation (Signed)
Clinical/Bedside Swallow Evaluation Patient Details  Name: Molly Taylor MRN: [id number] Date of Birth: 1926/06/05  Today's Date: 07/10/2017 Time: SLP Start Time (ACUTE ONLY): 0867 SLP Stop Time (ACUTE ONLY): 6195 SLP Time Calculation (min) (ACUTE ONLY): 21 min  Past Medical History:  Past Medical History:  Diagnosis Date  . Allergic rhinitis, cause unspecified   . Coronary artery calcification seen on CAT scan 06/16/2017   Calcifcations noted in LAD.  Chest CT angio 05/2017  . Family history of adverse reaction to anesthesia    all children - PONV  . Generalized osteoarthrosis, unspecified site   . GERD (gastroesophageal reflux disease)    pt. denies  . Hemorrhage of rectum and anus   . History of bronchitis   . HTN (hypertension)   . Hypercholesteremia    pt. denies  . Hypertonicity of bladder   . Pancreatitis, gallstone   . Persistent atrial fibrillation (HCC) paf   CHADS2VASC score is 5  . PONV (postoperative nausea and vomiting)   . Pulmonary HTN ([place])    Moderate with PASP 19mmHg on echo 11/2016   . Renal cell cancer ([place])    pt. denies  . Tachy-brady syndrome ([place])    s/p PPM  . Thoracic aortic aneurysm ([place])    45mm on echo 11/2016, 4.2cm by chest CT angio 05/2017  . Urinary incontinence   . Wears glasses    reading  . Wears partial dentures    upper partial   Past Surgical History:  Past Surgical History:  Procedure Laterality Date  . ABDOMINAL HYSTERECTOMY    . COLONOSCOPY    . LAPAROSCOPIC CHOLECYSTECTOMY  2007   lap choli  . left knee replacement - Dr. French Ana  2012   lt total knee  . PACEMAKER GENERATOR CHANGE  04/2013   MDT ADDRL1 pacemaker implanted by Dr Lovena Le  . PACEMAKER INSERTION  2007  . PERMANENT PACEMAKER GENERATOR CHANGE N/A 05/03/2013   Procedure: PERMANENT PACEMAKER GENERATOR CHANGE;  Surgeon: Evans Lance, MD;  Location: [facility] CATH LAB;  Service: Cardiovascular;  Laterality: N/A;  . PROXIMAL INTERPHALANGEAL FUSION (PIP) Right 08/22/2013   Procedure: EXCISION MUCOID CYST DEBRIDEMENT PROXIMAL INTERPHALANGEAL JOINT;  Surgeon: Wynonia Sours, MD;  Location: [facility];  Service: Orthopedics;  Laterality: Right;  . THUMB ARTHROSCOPY  2007   right  . TOTAL ABDOMINAL HYSTERECTOMY W/ BILATERAL SALPINGOOPHORECTOMY    . TOTAL KNEE ARTHROPLASTY Right 10/03/2015   Procedure: RIGHT TOTAL KNEE ARTHROPLASTY;  Surgeon: Earlie Server, MD;  Location: [facility];  Service: Orthopedics;  Laterality: Right;  . TUBAL LIGATION    . VEIN LIGATION     HPI:  84 yo female admitted with black stool and acute hypoxic resp failure secondary to RUL PNA. PMH: HTN, hyperlipidemia, thoracic aortic aneurysm, tachy brady afib Gerd.  Required venturi mask last night; currently on HFNC and oxygenating around 92%.    Assessment / Plan / Recommendation Clinical Impression  Pt presents with functional oropharyngeal swallow with adequate mastication, brisk swallow response, adequate coordination of swallow/respiratory cycle, and no s/s of aspiration.  Supportive daughters present.  We discussed relationship of breathing to swallowing, the benefits of pt taking her time when eating, taking frequent rest breaks, especially when she is SOB.  Family verbalized understanding.  Pt does have hx of GERD and esophageal deficits, but is asymptomatic during today's assessment.  Continue current regular diet, thin liquids.  No further SLP f/u is warranted.  SLP Visit Diagnosis: Dysphagia, unspecified (R13.10)  Aspiration Risk  No limitations    Diet Recommendation   continue regular solids, thin liquids Medication Administration: Whole meds with liquid - give whole with puree if coughing ensues.   Other  Recommendations Oral Care Recommendations: Oral care BID   Follow up Recommendations None      Frequency and Duration            Prognosis        Swallow Study   General Date of Onset: 07/15/2017 HPI: 84 yo female admitted with black stool and acute hypoxic  resp failure secondary to RUL PNA. PMH: HTN, hyperlipidemia, thoracic aortic aneurysm, tachy brady afib Gerd.  Requiring venturi mask.  Type of Study: Bedside Swallow Evaluation Previous Swallow Assessment: July 2018 with recs to continue regular solids, thin liquids Diet Prior to this Study: Regular;Thin liquids Temperature Spikes Noted: No Respiratory Status: Nasal cannula(HFNC) History of Recent Intubation: No Behavior/Cognition: Alert;Cooperative Oral Cavity Assessment: Within Functional Limits Oral Care Completed by SLP: No Oral Cavity - Dentition: Adequate natural dentition Vision: Functional for self-feeding Self-Feeding Abilities: Able to feed self Patient Positioning: Upright in chair Baseline Vocal Quality: Hoarse;Low vocal intensity Volitional Cough: Strong Volitional Swallow: Able to elicit    Oral/Motor/Sensory Function Overall Oral Motor/Sensory Function: Within functional limits   Ice Chips Ice chips: Within functional limits   Thin Liquid Thin Liquid: Within functional limits    Nectar Thick Nectar Thick Liquid: Not tested   Honey Thick Honey Thick Liquid: Not tested   Puree Puree: Within functional limits   Solid   GO   Solid: Within functional limits        Molly Taylor 07/10/2017,3:18 PM

## 2017-07-10 NOTE — Progress Notes (Signed)
PT Cancellation Note  Patient Details Name: Molly Taylor MRN: [id number] DOB: 20-Nov-1926   Cancelled Treatment:    Reason Eval/Treat Not Completed: Medical issues which prohibited therapy; Pt is still requiring Venturi mask to maintain SpO2=90-91%, pO2 arterial 54.3; defer today but will continue efforts to complete PT eval   [facility] 07/10/2017, 10:05 AM

## 2017-07-11 LAB — CBC WITH DIFFERENTIAL/PLATELET
Basophils Absolute: 0 10*3/uL (ref 0.0–0.1)
Basophils Relative: 0 %
EOS ABS: 0.1 10*3/uL (ref 0.0–0.7)
EOS PCT: 1 %
HCT: 30.5 % — ABNORMAL LOW (ref 36.0–46.0)
Hemoglobin: 10.6 g/dL — ABNORMAL LOW (ref 12.0–15.0)
LYMPHS ABS: 0.8 10*3/uL (ref 0.7–4.0)
LYMPHS PCT: 7 %
MCH: 31.5 pg (ref 26.0–34.0)
MCHC: 34.8 g/dL (ref 30.0–36.0)
MCV: 90.5 fL (ref 78.0–100.0)
MONO ABS: 1 10*3/uL (ref 0.1–1.0)
MONOS PCT: 8 %
Neutro Abs: 9.5 10*3/uL — ABNORMAL HIGH (ref 1.7–7.7)
Neutrophils Relative %: 84 %
PLATELETS: 225 10*3/uL (ref 150–400)
RBC: 3.37 MIL/uL — AB (ref 3.87–5.11)
RDW: 16 % — ABNORMAL HIGH (ref 11.5–15.5)
WBC: 11.4 10*3/uL — ABNORMAL HIGH (ref 4.0–10.5)

## 2017-07-11 LAB — BLOOD GAS, ARTERIAL
Acid-base deficit: 2.6 mmol/L — ABNORMAL HIGH (ref 0.0–2.0)
BICARBONATE: 22 mmol/L (ref 20.0–28.0)
Drawn by: 295031
O2 CONTENT: 12 L/min
O2 SAT: 86.7 %
PCO2 ART: 39.5 mmHg (ref 32.0–48.0)
Patient temperature: 98.6
pH, Arterial: 7.365 (ref 7.350–7.450)
pO2, Arterial: 53.4 mmHg — ABNORMAL LOW (ref 83.0–108.0)

## 2017-07-11 LAB — BASIC METABOLIC PANEL
Anion gap: 9 (ref 5–15)
BUN: 15 mg/dL (ref 6–20)
CO2: 24 mmol/L (ref 22–32)
CREATININE: 0.53 mg/dL (ref 0.44–1.00)
Calcium: 9.4 mg/dL (ref 8.9–10.3)
Chloride: 111 mmol/L (ref 101–111)
GFR calc Af Amer: >60 mL/min (ref >60)
GLUCOSE: 133 mg/dL — AB (ref 65–99)
POTASSIUM: 3.6 mmol/L (ref 3.5–5.1)
SODIUM: 144 mmol/L (ref 135–145)

## 2017-07-11 MED ORDER — ENSURE ENLIVE PO LIQD
237.0000 mL | ORAL | Status: DC
  Administered 2017-07-13: 237 mL via ORAL

## 2017-07-11 MED ORDER — FUROSEMIDE 10 MG/ML IJ SOLN
40.0000 mg | Freq: Once | INTRAMUSCULAR | Status: AC
  Administered 2017-07-11: 40 mg via INTRAVENOUS
  Filled 2017-07-11: qty 4

## 2017-07-11 NOTE — Progress Notes (Signed)
Nutrition Follow-up  DOCUMENTATION CODES:   Not applicable  INTERVENTION:  - Continue Ensure Enlive but will decrease to once/day. - Continue to encourage PO intakes.   NUTRITION DIAGNOSIS:   Inadequate oral intake related to poor appetite as evidenced by per patient/family report. -improving  GOAL:   Patient will meet greater than or equal to 90% of their needs -unmet at this time, improving  MONITOR:   PO intake, Supplement acceptance, Weight trends  ASSESSMENT:   84 year old female who presented to ED with intermittent rectal bleeding for the past several weeks. PMH significant for GERD, hypertension, hyperlipidemia, aortic aneurysm, and Pafib. Admitted for pneumonia.  Weight +9 lbs/4.2 lbs from 4/19-4/21. Diet advanced from FLD to Heart Healthy on 4/19 at 3:00 PM. Per flow sheet, pt consumed 75% of dinner on 4/20 and 100% of breakfast this AM. SLP signed off yesterday. Pt reports improving appetite and that she is having an easier time with eating now that she is on HFNC rather than ventimask. Ensure Enlive is ordered BID but she has only been able to make herself drink 1/day. Will decrease order at this time as meal intakes are improving; will monitor if further change is warranted.   Per Dr. Serena Colonel note yesterday afternoon: sepsis likely d/t CAP with UA/UC, respiratory panel pending, lower GIB with CT abd/pelvis in the ED not showing any abnormalities which would explain rectal bleeding.  Medications reviewed; 40 mg oral Protonix BID.  Labs reviewed.    Diet Order:  Diet Heart Room service appropriate? Yes; Fluid consistency: Thin  EDUCATION NEEDS:   No education needs have been identified at this time  Skin:  Skin Assessment: Reviewed RN Assessment  Last BM:  4/21  Height:   Ht Readings from Last 1 Encounters:  07/08/17 5\' 5"  (1.651 m)    Weight:   Wt Readings from Last 1 Encounters:  07/10/17 173 lb 4.5 oz (78.6 kg)    Ideal Body Weight:  56.8  kg  BMI:  Body mass index is 28.84 kg/m.  Estimated Nutritional Needs:   Kcal:  1400-1600 kcal/day (MSJ x 1.2-1.4)  Protein:  75-90 grams/day  Fluid:  1.4-1.6 L/day      Jarome Matin, MS, RD, LDN, [facility] Inpatient Clinical Dietitian Pager # [phone] After hours/weekend pager # [phone]

## 2017-07-11 NOTE — Progress Notes (Signed)
PT Cancellation Note  Patient Details Name: ZANASIA HICKSON MRN: [id number] DOB: [birth year]   Cancelled Treatment:    Reason Eval/Treat Not Completed: Medical issues which prohibited therapy, patient on HFNC at 12 L. Will check back when oxygen demands decrease.    [name] 07/11/2017, 2:23 PM Tresa Endo PT [phone]

## 2017-07-11 NOTE — Progress Notes (Signed)
PROGRESS NOTE  Molly Taylor FBP:[id number] DOB: [birth year] DOA: 06/23/2017 PCP: Alroy Dust, L.Marlou Sa, MD  HPI/Recap of past 24 hours: Molly Taylor  is a 84 y.o. female with history of hypertension, hyperlipidemia, thoracic aortic aneurysm, tachy-brady, P.afib on eliquis, GERD presents to the ED, c/o worsening productive cough, SOB, fatigue for the past 3 weeks, denies any fever/chills, chest pain. Pt also reported having black stools about several weeks ago, which have now resolved. Over the past week, has had intermittent brbpr mixed with stool. Pt denies abdominal pain, N/V, diarrhea/constipation and then her stool turned brown and over the past 1 week she has had slight brbpr. Pt denies fever, chills, n/v,  abd pain, diarrhea, constipation. In the ED pt noted to be hypoxic on RA with sats in the 80s, FOBT was noted to be negative, CTA chest done showed multifocal PNA, no PE noted. Pt admitted for further management.  Today, pt still requiring venti-mask/HFNC with some hypoxia noted. Met pt having breakfast. No recent fever/chills. Denies any further BRBPR/melena, chest pain,  abdominal pain.   Assessment/Plan: Principal Problem:   Community acquired pneumonia of right upper lobe of lung ([facility] [place]) Active Problems:   Rectal bleeding   Anemia  Sepsis likely due to multifocal CAP Last temp 100.8 on 07/09/17, with leukocytosis LA 1.1, procalcitonin <0.10 CXR with worsening PNA BC X 2 NGTD, repeat on 4/21: NGTD UA with trace leukocytes, protein, rare bacteria, mucus present. UC pending Continue IV Cefepime, continue azithromycin. MRSA PCR neg Monitor closely  Acute hypoxic respiratory failure 2/2 multifocal PNA Still requiring venturi mask/HFNC, no sig improvement ABG showed hypoxia, will repeat today CT chest showed: multifocal PNA, no PE Repeat CXR: showed worsening PNA Respiratory panel negative Urine strep pneumo negative, legionella negative Continue management as above  Lower  GI bleed Hx of intermittent BRBPR, none noted since admission Hgb remained stable since admission, FOBT negative CT abd/pelvis done in ER showed no focal abnormality seen to explain the patient's rectal bleeding Will consult GI if bleeding noted/drop in hemoglobin Continue PPI, restarted home eliquis  Acute blood loss anemia Likely due to above Hgb baseline around 11-12, noted 11.5 on admission Daily CBC  Hypokalemia Replace prn  P. Afib Paced rhythm, rate controlled Continue amiodarone, diltiazem, metoprolol Restarted eliquis  HTN Stable Continue home meds  GERD PPI     Code Status: Full  Family Communication: Spoke to daughter extensively on 07/10/17  Disposition Plan: SNF   Consultants:  None  Procedures:  None  Antimicrobials:  IV Cefepime  Azithromycin  DVT prophylaxis:  Eliquis   Objective: Vitals:   07/11/17 1200 07/11/17 1300 07/11/17 1400 07/11/17 1416  BP: (!) 150/82  (!) 146/71   Pulse: 85 76 76   Resp: (!) 30 (!) 33 20   Temp:      TempSrc:      SpO2: (!) 88% (!) 89%  92%  Weight:      Height:        Intake/Output Summary (Last 24 hours) at 07/11/2017 1451 Last data filed at 07/11/2017 1432 Gross per 24 hour  Intake 1100 ml  Output 800 ml  Net 300 ml   Filed Weights   06/22/2017 2003 07/08/17 0344 07/10/17 0414  Weight: 72.6 kg (160 lb) 74.4 kg (164 lb 0.4 oz) 78.6 kg (173 lb 4.5 oz)    Exam:   General: NAD   Cardiovascular: S1, S2 present  Respiratory: Bibasilar crackles noted  Abdomen: Soft, NT, ND, BS present  Musculoskeletal: No  pedal edema bilaterally  Skin: Normal  Psychiatry: Normal mood   Data Reviewed: CBC: Recent Labs  Lab 06/27/2017 2158 07/08/17 0701 07/08/17 1312 07/09/17 0305 07/10/17 0336 07/11/17 0402  WBC 7.1 7.4  --  8.9 11.2* 11.4*  NEUTROABS  --   --   --  7.0 9.3* 9.5*  HGB 11.5* 9.9* 10.0* 10.0* 11.1* 10.6*  HCT 32.0* 28.4* 28.6* 29.0* 32.6* 30.5*  MCV 88.4 88.5  --  89.8 90.8  90.5  PLT 228 205  --  196 240 248   Basic Metabolic Panel: Recent Labs  Lab 07/12/2017 2158 07/09/17 0305 07/10/17 0336 07/11/17 0402  NA 138 145 141 144  K 2.8* 3.7 3.5 3.6  CL 105 115* 110 111  CO2 22 16* 21* 24  GLUCOSE 144* 115* 128* 133*  BUN 14 8 11 15   CREATININE 0.68 0.64 0.66 0.53  CALCIUM 9.1 8.7* 8.8* 9.4   GFR: Estimated Creatinine Clearance: 50.3 mL/min (by C-G formula based on SCr of 0.53 mg/dL). Liver Function Tests: Recent Labs  Lab 06/30/2017 2158  AST 35  ALT 23  ALKPHOS 125  BILITOT 1.1  PROT 7.1  ALBUMIN 3.0*   No results for input(s): LIPASE, AMYLASE in the last 168 hours. No results for input(s): AMMONIA in the last 168 hours. Coagulation Profile: No results for input(s): INR, PROTIME in the last 168 hours. Cardiac Enzymes: No results for input(s): CKTOTAL, CKMB, CKMBINDEX, TROPONINI in the last 168 hours. BNP (last 3 results) No results for input(s): PROBNP in the last 8760 hours. HbA1C: No results for input(s): HGBA1C in the last 72 hours. CBG: No results for input(s): GLUCAP in the last 168 hours. Lipid Profile: No results for input(s): CHOL, HDL, LDLCALC, TRIG, CHOLHDL, LDLDIRECT in the last 72 hours. Thyroid Function Tests: No results for input(s): TSH, T4TOTAL, FREET4, T3FREE, THYROIDAB in the last 72 hours. Anemia Panel: No results for input(s): VITAMINB12, FOLATE, FERRITIN, TIBC, IRON, RETICCTPCT in the last 72 hours. Urine analysis:    Component Value Date/Time   COLORURINE YELLOW 07/10/2017 1714   APPEARANCEUR HAZY (A) 07/10/2017 1714   LABSPEC 1.019 07/10/2017 1714   PHURINE 5.0 07/10/2017 1714   GLUCOSEU NEGATIVE 07/10/2017 1714   HGBUR SMALL (A) 07/10/2017 1714   BILIRUBINUR NEGATIVE 07/10/2017 1714   KETONESUR NEGATIVE 07/10/2017 1714   PROTEINUR >=300 (A) 07/10/2017 1714   UROBILINOGEN 0.2 03/16/2011 1529   NITRITE NEGATIVE 07/10/2017 1714   LEUKOCYTESUR TRACE (A) 07/10/2017 1714   Sepsis  Labs: @LABRCNTIP (procalcitonin:4,lacticidven:4)  ) Recent Results (from the past 240 hour(s))  Blood culture (routine x 2)     Status: None (Preliminary result)   Collection Time: 07/08/17  1:10 AM  Result Value Ref Range Status   Specimen Description   Final    BLOOD LEFT ARM Performed at [facility], [address] [place], [place] [address]    Special Requests   Final    BOTTLES DRAWN AEROBIC AND ANAEROBIC Blood Culture adequate volume Performed at [facility], [address] [place], [place] [address]    Culture   Final    NO GROWTH 3 DAYS Performed at [facility], [place] [address], [place], [place] [address]    Report Status PENDING  Incomplete  Blood culture (routine x 2)     Status: None (Preliminary result)   Collection Time: 07/08/17  1:33 AM  Result Value Ref Range Status   Specimen Description   Final  BLOOD LEFT ARM Performed at [facility], [address] [place], [place] [address]    Special Requests   Final    BOTTLES DRAWN AEROBIC AND ANAEROBIC Blood Culture adequate volume Performed at [facility], [address] [place], [place] [address]    Culture   Final    NO GROWTH 3 DAYS Performed at [facility], [place] [address], [place], [place] [address]    Report Status PENDING  Incomplete  MRSA PCR Screening     Status: None   Collection Time: 07/08/17  3:38 AM  Result Value Ref Range Status   MRSA by PCR NEGATIVE NEGATIVE Final    Comment:        The GeneXpert MRSA Assay (FDA approved for NASAL specimens only), is one component of a comprehensive MRSA colonization surveillance program. It is not intended to diagnose MRSA infection nor to guide or monitor treatment for MRSA infections. Performed at [facility], [place] [address], [place], [place] [address]   Culture, sputum-assessment     Status: None   Collection Time: 07/08/17 11:38 AM  Result Value Ref Range Status    Specimen Description SPUTUM  Final   Special Requests Normal  Final   Sputum evaluation   Final    Sputum specimen not acceptable for testing.  Please recollect.   Performed at [facility], [place] [address] [place], [place] [address]    Report Status 07/08/2017 FINAL  Final  Culture, blood (routine x 2)     Status: None (Preliminary result)   Collection Time: 07/10/17  7:48 AM  Result Value Ref Range Status   Specimen Description   Final    BLOOD RIGHT HAND Performed at [facility] [address], [place], [place] [address]    Special Requests   Final    BOTTLES DRAWN AEROBIC ONLY Blood Culture adequate volume Performed at [place] [address] [place], [place] [address]    Culture   Final    NO GROWTH 1 DAY Performed at [facility], [place] [address], [place], [place] [address]    Report Status PENDING  Incomplete  Culture, blood (routine x 2)     Status: None (Preliminary result)   Collection Time: 07/10/17  8:05 AM  Result Value Ref Range Status   Specimen Description   Final    BLOOD LEFT HAND Performed at [facility] [address], [place], [place] [address]    Special Requests   Final    BOTTLES DRAWN AEROBIC AND ANAEROBIC Blood Culture adequate volume Performed at [place] [address] [place], [place] [address]    Culture   Final    NO GROWTH 1 DAY Performed at [facility], [place] [address] [place], [place] [address]    Report Status PENDING  Incomplete  Respiratory Panel by PCR     Status: None   Collection Time: 07/10/17 12:22 PM  Result Value Ref Range Status   Adenovirus NOT DETECTED NOT DETECTED Final   Coronavirus 229E NOT DETECTED NOT DETECTED Final   Coronavirus HKU1 NOT DETECTED NOT DETECTED Final   Coronavirus NL63 NOT DETECTED NOT DETECTED Final   Coronavirus OC43 NOT DETECTED NOT DETECTED Final   Metapneumovirus NOT DETECTED  NOT DETECTED Final   Rhinovirus / Enterovirus NOT DETECTED NOT DETECTED Final   Influenza A NOT DETECTED NOT DETECTED Final  Influenza B NOT DETECTED NOT DETECTED Final   Parainfluenza Virus 1 NOT DETECTED NOT DETECTED Final   Parainfluenza Virus 2 NOT DETECTED NOT DETECTED Final   Parainfluenza Virus 3 NOT DETECTED NOT DETECTED Final   Parainfluenza Virus 4 NOT DETECTED NOT DETECTED Final   Respiratory Syncytial Virus NOT DETECTED NOT DETECTED Final   Bordetella pertussis NOT DETECTED NOT DETECTED Final   Chlamydophila pneumoniae NOT DETECTED NOT DETECTED Final   Mycoplasma pneumoniae NOT DETECTED NOT DETECTED Final    Comment: Performed at [facility], [place] [address], [place], [place] [address]      Studies: No results found.  Scheduled Meds: . acetylcysteine  2 mL Nebulization TID  . amiodarone  200 mg Oral Daily  . apixaban  5 mg Oral BID  . diltiazem  240 mg Oral Daily  . [START ON 07/12/2017] feeding supplement (ENSURE ENLIVE)  237 mL Oral Q24H  . ipratropium-albuterol  3 mL Nebulization TID  . mouth rinse  15 mL Mouth Rinse BID  . metoprolol succinate  25 mg Oral Daily  . pantoprazole  40 mg Oral BID    Continuous Infusions: . azithromycin Stopped (07/11/17 0300)  . ceFEPime (MAXIPIME) IV Stopped (07/11/17 1432)     LOS: 3 days     Alma Friendly, MD [facility]   If 7PM-7AM, please contact night-coverage [email or web address] Password [email or web address] 07/11/2017, 2:51 PM

## 2017-07-12 DIAGNOSIS — J9601 Acute respiratory failure with hypoxia: Secondary | ICD-10-CM

## 2017-07-12 DIAGNOSIS — G9340 Encephalopathy, unspecified: Secondary | ICD-10-CM

## 2017-07-12 DIAGNOSIS — Z515 Encounter for palliative care: Secondary | ICD-10-CM

## 2017-07-12 DIAGNOSIS — I10 Essential (primary) hypertension: Secondary | ICD-10-CM

## 2017-07-12 DIAGNOSIS — J181 Lobar pneumonia, unspecified organism: Secondary | ICD-10-CM

## 2017-07-12 DIAGNOSIS — Z7189 Other specified counseling: Secondary | ICD-10-CM

## 2017-07-12 LAB — PROCALCITONIN: Procalcitonin: 0.19 ng/mL

## 2017-07-12 LAB — CBC WITH DIFFERENTIAL/PLATELET
BASOS PCT: 0 %
Basophils Absolute: 0 10*3/uL (ref 0.0–0.1)
EOS PCT: 1 %
Eosinophils Absolute: 0.1 10*3/uL (ref 0.0–0.7)
HEMATOCRIT: 32.1 % — AB (ref 36.0–46.0)
Hemoglobin: 10.8 g/dL — ABNORMAL LOW (ref 12.0–15.0)
LYMPHS PCT: 5 %
Lymphs Abs: 0.7 10*3/uL (ref 0.7–4.0)
MCH: 30.6 pg (ref 26.0–34.0)
MCHC: 33.6 g/dL (ref 30.0–36.0)
MCV: 90.9 fL (ref 78.0–100.0)
MONO ABS: 1.1 10*3/uL — AB (ref 0.1–1.0)
MONOS PCT: 8 %
Neutro Abs: 12.4 10*3/uL — ABNORMAL HIGH (ref 1.7–7.7)
Neutrophils Relative %: 86 %
PLATELETS: 233 10*3/uL (ref 150–400)
RBC: 3.53 MIL/uL — ABNORMAL LOW (ref 3.87–5.11)
RDW: 16.2 % — AB (ref 11.5–15.5)
WBC: 14.3 10*3/uL — ABNORMAL HIGH (ref 4.0–10.5)

## 2017-07-12 LAB — BASIC METABOLIC PANEL
ANION GAP: 13 (ref 5–15)
BUN: 15 mg/dL (ref 6–20)
CO2: 24 mmol/L (ref 22–32)
CREATININE: 0.63 mg/dL (ref 0.44–1.00)
Calcium: 9.5 mg/dL (ref 8.9–10.3)
Chloride: 107 mmol/L (ref 101–111)
GFR calc Af Amer: >60 mL/min (ref >60)
GLUCOSE: 146 mg/dL — AB (ref 65–99)
Potassium: 3.4 mmol/L — ABNORMAL LOW (ref 3.5–5.1)
Sodium: 144 mmol/L (ref 135–145)

## 2017-07-12 LAB — URINE CULTURE

## 2017-07-12 MED ORDER — SODIUM CHLORIDE 0.9 % IV SOLN
1.0000 g | Freq: Two times a day (BID) | INTRAVENOUS | Status: DC
  Administered 2017-07-12 – 2017-07-14 (×4): 1 g via INTRAVENOUS
  Filled 2017-07-12 (×5): qty 1

## 2017-07-12 MED ORDER — POTASSIUM CHLORIDE 10 MEQ/100ML IV SOLN
10.0000 meq | INTRAVENOUS | Status: AC
  Administered 2017-07-12 (×4): 10 meq via INTRAVENOUS
  Filled 2017-07-12 (×4): qty 100

## 2017-07-12 MED ORDER — VANCOMYCIN HCL IN DEXTROSE 750-5 MG/150ML-% IV SOLN
750.0000 mg | INTRAVENOUS | Status: DC
  Administered 2017-07-13: 750 mg via INTRAVENOUS
  Filled 2017-07-12 (×2): qty 150

## 2017-07-12 MED ORDER — VANCOMYCIN HCL 10 G IV SOLR
1500.0000 mg | Freq: Once | INTRAVENOUS | Status: AC
  Administered 2017-07-12: 1500 mg via INTRAVENOUS
  Filled 2017-07-12: qty 1500

## 2017-07-12 MED ORDER — FUROSEMIDE 10 MG/ML IJ SOLN
40.0000 mg | Freq: Three times a day (TID) | INTRAMUSCULAR | Status: AC
  Administered 2017-07-12 (×2): 40 mg via INTRAVENOUS
  Filled 2017-07-12 (×2): qty 4

## 2017-07-12 MED ORDER — METOPROLOL TARTRATE 5 MG/5ML IV SOLN: 2.5000 mg | INTRAVENOUS | Status: DC | PRN

## 2017-07-12 NOTE — Progress Notes (Signed)
OT Cancellation Note  Patient Details Name: Molly Taylor MRN: [id number] DOB: 02-Nov-1926   Cancelled Treatment:    Reason Eval/Treat Not Completed: Other (comment).  Noted pt is back on bipap. Will check back another day.  Sian Rockers 07/12/2017, 11:20 AM  Lesle Chris, OTR/L [phone] 07/12/2017

## 2017-07-12 NOTE — Progress Notes (Signed)
PROGRESS NOTE  Molly Taylor JKK:[id number] DOB: 1926-09-21 DOA: 07/18/2017 PCP: Molly Taylor, Molly Sa, MD  HPI/Recap of past 24 hours: Molly Taylor  is a 84 y.o. female with history of hypertension, hyperlipidemia, thoracic aortic aneurysm, tachy-brady, P.afib on eliquis, GERD presents to the ED, c/o worsening productive cough, SOB, fatigue for the past 3 weeks, denies any fever/chills, chest pain. Pt also reported having black stools about several weeks ago, which have now resolved. Over the past week, has had intermittent brbpr mixed with stool. Pt denies abdominal pain, N/V, diarrhea/constipation and then her stool turned brown and over the past 1 week she has had slight brbpr. Pt denies fever, chills, n/v,  abd pain, diarrhea, constipation. In the ED pt noted to be hypoxic on RA with sats in the 80s, FOBT was noted to be negative, CTA chest done showed multifocal PNA, no PE noted. Pt admitted for further management.  Overnight, pt noted to be more hypoxic, not maintaining sats on HFNC/venti mask. This am, had to be placed on Bipap due to hypoxia. Pt very lethargic. PCCM consulted.   Assessment/Plan: Principal Problem:   Community acquired pneumonia of right upper lobe of lung ([place]) Active Problems:   Rectal bleeding   Anemia   Acute respiratory failure with hypoxemia ([facility])   Acute encephalopathy   Palliative care encounter   Goals of care, counseling/discussion  Sepsis likely due to multifocal CAP Currently afebrile with worsening leukocytosis LA 1.1, procalcitonin 0.19 CXR with worsening PNA BC X 2 NGTD, repeat on 4/21: NGTD, repeat on 4/23 pending UA with trace leukocytes, protein, rare bacteria, mucus present.  UC with insignificant growth PCCM consulted: Change to Meropenem and Vancomycin, continue azithromycin Monitor closely  Acute hypoxic respiratory failure 2/2 multifocal PNA Worsening, no on bipap Repeat ABG with persistent hypoxia CT chest showed: multifocal PNA,  no PE Repeat CXR: showed worsening PNA Respiratory panel negative Urine strep pneumo negative, legionella negative PCCM on board, will follow Continue management as above  Lower GI bleed Hx of intermittent BRBPR, none noted since admission Hgb remained stable since admission, FOBT negative CT abd/pelvis done in ER showed no focal abnormality seen to explain the patient's rectal bleeding Will consult GI if bleeding noted/drop in hemoglobin Continue PPI, restarted home eliquis  Acute blood loss anemia Likely due to above Hgb baseline around 11-12, noted 11.5 on admission Daily CBC  Hypokalemia Replace prn  P. Afib Paced rhythm, rate controlled Continue amiodarone, diltiazem, metoprolol IV prn Restarted eliquis  HTN Stable Continue home meds  GERD PPI     Code Status: Partial code  Family Communication: Spoke to daughters extensively on 07/12/17  Disposition Plan: SNF once stable   Consultants:  PCCM  Procedures:  None  Antimicrobials:  IV Meropenem  IV Vancomycin  Azithromycin  DVT prophylaxis:  Eliquis   Objective: Vitals:   07/12/17 1100 07/12/17 1145 07/12/17 1226 07/12/17 1300  BP:      Pulse: 72     Resp: (!) 25     Temp:  99 F (37.2 C)    TempSrc:  Axillary    SpO2:   (!) 88% 90%  Weight:      Height:        Intake/Output Summary (Last 24 hours) at 07/12/2017 1424 Last data filed at 07/12/2017 1303 Gross per 24 hour  Intake 350 ml  Output 3600 ml  Net -3250 ml   Filed Weights   07/19/2017 2003 07/08/17 0344 07/10/17 0414  Weight: 72.6 kg (160 lb)  74.4 kg (164 lb 0.4 oz) 78.6 kg (173 lb 4.5 oz)    Exam:   General: NAD   Cardiovascular: S1, S2 present  Respiratory: Bibasilar crackles noted  Abdomen: Soft, NT, ND, BS present  Musculoskeletal: No pedal edema bilaterally  Skin: Normal  Psychiatry: Normal mood   Data Reviewed: CBC: Recent Labs  Lab 07/08/17 0701 07/08/17 1312 07/09/17 0305 07/10/17 0336  07/11/17 0402 07/12/17 0326  WBC 7.4  --  8.9 11.2* 11.4* 14.3*  NEUTROABS  --   --  7.0 9.3* 9.5* 12.4*  HGB 9.9* 10.0* 10.0* 11.1* 10.6* 10.8*  HCT 28.4* 28.6* 29.0* 32.6* 30.5* 32.1*  MCV 88.5  --  89.8 90.8 90.5 90.9  PLT 205  --  196 240 225 161   Basic Metabolic Panel: Recent Labs  Lab 06/28/2017 2158 07/09/17 0305 07/10/17 0336 07/11/17 0402 07/12/17 0326  NA 138 145 141 144 144  K 2.8* 3.7 3.5 3.6 3.4*  CL 105 115* 110 111 107  CO2 22 16* 21* 24 24  GLUCOSE 144* 115* 128* 133* 146*  BUN 14 8 11 15 15   CREATININE 0.68 0.64 0.66 0.53 0.63  CALCIUM 9.1 8.7* 8.8* 9.4 9.5   GFR: Estimated Creatinine Clearance: 50.3 mL/min (by C-G formula based on SCr of 0.63 mg/dL). Liver Function Tests: Recent Labs  Lab 07/09/2017 2158  AST 35  ALT 23  ALKPHOS 125  BILITOT 1.1  PROT 7.1  ALBUMIN 3.0*   No results for input(s): LIPASE, AMYLASE in the last 168 hours. No results for input(s): AMMONIA in the last 168 hours. Coagulation Profile: No results for input(s): INR, PROTIME in the last 168 hours. Cardiac Enzymes: No results for input(s): CKTOTAL, CKMB, CKMBINDEX, TROPONINI in the last 168 hours. BNP (last 3 results) No results for input(s): PROBNP in the last 8760 hours. HbA1C: No results for input(s): HGBA1C in the last 72 hours. CBG: No results for input(s): GLUCAP in the last 168 hours. Lipid Profile: No results for input(s): CHOL, HDL, LDLCALC, TRIG, CHOLHDL, LDLDIRECT in the last 72 hours. Thyroid Function Tests: No results for input(s): TSH, T4TOTAL, FREET4, T3FREE, THYROIDAB in the last 72 hours. Anemia Panel: No results for input(s): VITAMINB12, FOLATE, FERRITIN, TIBC, IRON, RETICCTPCT in the last 72 hours. Urine analysis:    Component Value Date/Time   COLORURINE YELLOW 07/10/2017 1714   APPEARANCEUR HAZY (A) 07/10/2017 1714   LABSPEC 1.019 07/10/2017 1714   PHURINE 5.0 07/10/2017 1714   GLUCOSEU NEGATIVE 07/10/2017 1714   HGBUR SMALL (A) 07/10/2017 1714     BILIRUBINUR NEGATIVE 07/10/2017 1714   KETONESUR NEGATIVE 07/10/2017 1714   PROTEINUR >=300 (A) 07/10/2017 1714   UROBILINOGEN 0.2 03/16/2011 1529   NITRITE NEGATIVE 07/10/2017 1714   LEUKOCYTESUR TRACE (A) 07/10/2017 1714   Sepsis Labs: @LABRCNTIP (procalcitonin:4,lacticidven:4)  ) Recent Results (from the past 240 hour(s))  Blood culture (routine x 2)     Status: None (Preliminary result)   Collection Time: 07/08/17  1:10 AM  Result Value Ref Range Status   Specimen Description   Final    BLOOD LEFT ARM Performed at [facility], [address] [place], [place] [address]    Special Requests   Final    BOTTLES DRAWN AEROBIC AND ANAEROBIC Blood Culture adequate volume Performed at [facility], [address] [place], [place] [address]    Culture   Final    NO GROWTH 4 DAYS Performed at [facility], [place]  [address] [place], [place] [address]    Report Status PENDING  Incomplete  Blood culture (routine x 2)     Status: None (Preliminary result)   Collection Time: 07/08/17  1:33 AM  Result Value Ref Range Status   Specimen Description   Final    BLOOD LEFT ARM Performed at [facility], [address] [place], [place] [address]    Special Requests   Final    BOTTLES DRAWN AEROBIC AND ANAEROBIC Blood Culture adequate volume Performed at [facility], [address] [place], [place] [address]    Culture   Final    NO GROWTH 4 DAYS Performed at [facility], [place] [address] [place], [place] [address]    Report Status PENDING  Incomplete  MRSA PCR Screening     Status: None   Collection Time: 07/08/17  3:38 AM  Result Value Ref Range Status   MRSA by PCR NEGATIVE NEGATIVE Final    Comment:        The GeneXpert MRSA Assay (FDA approved for NASAL specimens only), is one component of a comprehensive MRSA colonization surveillance program. It is not intended to diagnose MRSA infection nor to guide or monitor  treatment for MRSA infections. Performed at [facility], [place] [address], [place], [place] [address]   Culture, sputum-assessment     Status: None   Collection Time: 07/08/17 11:38 AM  Result Value Ref Range Status   Specimen Description SPUTUM  Final   Special Requests Normal  Final   Sputum evaluation   Final    Sputum specimen not acceptable for testing.  Please recollect.   Performed at [facility], [place] [address] [place], [place] [address]    Report Status 07/08/2017 FINAL  Final  Culture, blood (routine x 2)     Status: None (Preliminary result)   Collection Time: 07/10/17  7:48 AM  Result Value Ref Range Status   Specimen Description   Final    BLOOD RIGHT HAND Performed at [facility] [address], [place], [place] [address]    Special Requests   Final    BOTTLES DRAWN AEROBIC ONLY Blood Culture adequate volume Performed at [facility] [address], [place], [place] [address]    Culture   Final    NO GROWTH 2 DAYS Performed at [facility] [address], [place], [place] [address]    Report Status PENDING  Incomplete  Culture, blood (routine x 2)     Status: None (Preliminary result)   Collection Time: 07/10/17  8:05 AM  Result Value Ref Range Status   Specimen Description   Final    BLOOD LEFT HAND Performed at [facility] [address] [place], [place] [address]    Special Requests   Final    BOTTLES DRAWN AEROBIC AND ANAEROBIC Blood Culture adequate volume Performed at [facility] [address], [place], [place] [address]    Culture   Final    NO GROWTH 2 DAYS Performed at [facility] [address] [place], [place] [address]    Report Status PENDING  Incomplete  Respiratory Panel by PCR     Status: None   Collection Time: 07/10/17 12:22 PM  Result Value Ref Range Status   Adenovirus NOT DETECTED NOT DETECTED  Final   Coronavirus 229E NOT DETECTED NOT DETECTED Final   Coronavirus HKU1 NOT  DETECTED NOT DETECTED Final   Coronavirus NL63 NOT DETECTED NOT DETECTED Final   Coronavirus OC43 NOT DETECTED NOT DETECTED Final   Metapneumovirus NOT DETECTED NOT DETECTED Final   Rhinovirus / Enterovirus NOT DETECTED NOT DETECTED Final   Influenza A NOT DETECTED NOT DETECTED Final   Influenza B NOT DETECTED NOT DETECTED Final   Parainfluenza Virus 1 NOT DETECTED NOT DETECTED Final   Parainfluenza Virus 2 NOT DETECTED NOT DETECTED Final   Parainfluenza Virus 3 NOT DETECTED NOT DETECTED Final   Parainfluenza Virus 4 NOT DETECTED NOT DETECTED Final   Respiratory Syncytial Virus NOT DETECTED NOT DETECTED Final   Bordetella pertussis NOT DETECTED NOT DETECTED Final   Chlamydophila pneumoniae NOT DETECTED NOT DETECTED Final   Mycoplasma pneumoniae NOT DETECTED NOT DETECTED Final    Comment: Performed at [facility], [place] [address], [place], [place] [address]  Culture, Urine     Status: Abnormal   Collection Time: 07/10/17  5:14 PM  Result Value Ref Range Status   Specimen Description   Final    URINE, RANDOM Performed at [place] [address], [place], [place] [address]    Special Requests   Final    NONE Performed at [facility], [place] [address], [place], [place] [address]    Culture (A)  Final    <10,000 COLONIES/mL INSIGNIFICANT GROWTH Performed at [facility] [address], [place], [place] [address]    Report Status 07/12/2017 FINAL  Final      Studies: No results found.  Scheduled Meds: . amiodarone  200 mg Oral Daily  . apixaban  5 mg Oral BID  . diltiazem  240 mg Oral Daily  . feeding supplement (ENSURE ENLIVE)  237 mL Oral Q24H  . furosemide  40 mg Intravenous Q8H  . ipratropium-albuterol  3 mL Nebulization TID  . mouth rinse  15 mL Mouth Rinse BID  . pantoprazole  40 mg Oral BID    Continuous Infusions: . azithromycin  Stopped (07/12/17 0100)  . meropenem (MERREM) IV    . potassium chloride Stopped (07/12/17 1347)  . vancomycin       LOS: 4 days     Alma Friendly, MD [facility]   If 7PM-7AM, please contact night-coverage [email or web address] Password [facility] 07/12/2017, 2:24 PM

## 2017-07-12 NOTE — Progress Notes (Signed)
Daughters brought patient's living will.  It is unfortunately unclear what her wishes were at the time of filling the living will but after discussion the patient has made it very clear that she would not want aggressive care to include machines.  Based on discussion and the fact that patient has not been doing well decision was made to make patient a LCB with pressors and BiPAP only but no further escalation of care.  Abx all changed as previous note.  Pan cultured and will f/u.  LCB placed in the chart and a copy of the living will is to be placed in our chart.  The patient is critically ill with multiple organ systems failure and requires high complexity decision making for assessment and support, frequent evaluation and titration of therapies, application of advanced monitoring technologies and extensive interpretation of multiple databases.   Critical Care Time devoted to patient care services described in this note is  35  Minutes. This time reflects time of care of this signee Dr Jennet Maduro. This critical care time does not reflect procedure time, or teaching time or supervisory time of PA/NP/Med student/Med Resident etc but could involve care discussion time.  [name], M.D. [facility] Pulmonary/Critical Care Medicine. Pager: [phone]. After hours pager: [phone].

## 2017-07-12 NOTE — Progress Notes (Signed)
Pharmacy Antibiotic Note  Molly Taylor is a 84 y.o. female admitted on 06/23/2017 with CAP. Patient had received 2 days of Rocephin/Zithromax when she became hypoxic early this AM. Pharmacy was consulted 4/21 for cefepime dosing for sepsis with likely pulmonary or urinary source.  4/23 with worsening PNA, abx escalated to Meropenem and Vancomycin Continuing Azithromycin for total 7 days  Plan: Azithromycin 500mg  IV q24 Meropenem 1gm q12 Vancomycin 1500mg  x1, then 750mg  q24  Height: 5\' 5"  (165.1 cm) Weight: 173 lb 4.5 oz (78.6 kg) IBW/kg (Calculated) : 57  Temp (24hrs), Avg:98.7 F (37.1 C), Min:98.4 F (36.9 C), Max:99 F (37.2 C)  Recent Labs  Lab 06/25/2017 2158 07/08/17 0108 07/08/17 0701 07/09/17 0305 07/10/17 0336 07/10/17 1021 07/11/17 0402 07/12/17 0326  WBC 7.1  --  7.4 8.9 11.2*  --  11.4* 14.3*  CREATININE 0.68  --   --  0.64 0.66  --  0.53 0.63  LATICACIDVEN  --  1.06  --   --   --  1.1  --   --     Estimated Creatinine Clearance: 50.3 mL/min (by C-G formula based on SCr of 0.63 mg/dL).    Allergies  Allergen Reactions  . Hydrocodone     Excessive vomiting  . Sulfa Antibiotics Swelling  . Codeine Nausea And Vomiting   Antimicrobials this admission: 4/19 Rocephin >> 4/21 4/19 Azithro >> (4/25) 4/21 Cefepime >> 4/23 4/23 Meropenem >> 4/23 Vancomycin >>  Microbiology results: 4/19 MRSA PCR: neg 4/19 BCx: ngtd 4/19 Sputum: not acceptable 4/19 Urine strep Ag: neg 4/19 Urine legionella Ag: neg 4/21 BCx: ngtd 4/21 UCx: < 10,000 colonies -final 4/21 Respiratory panel: neg 4/23 BCx: sent  Thank you for allowing pharmacy to be a part of this patient's care.  Minda Ditto PharmD Pager [phone] 07/12/2017, 1:44 PM

## 2017-07-12 NOTE — Progress Notes (Signed)
PT Cancellation Note  Patient Details Name: Molly Taylor MRN: [id number] DOB: [birth year]   Cancelled Treatment:    Reason Eval/Treat Not Completed: Medical issues which prohibited therapy   [name] 07/12/2017, 1:13 PM  Tresa Endo PT [phone]

## 2017-07-12 NOTE — Consult Note (Signed)
PULMONARY / CRITICAL CARE MEDICINE   Name: Molly Taylor MRN: [id number] DOB: [birth year]    ADMISSION DATE:  07/06/2017 CONSULTATION DATE: 07/12/2017  REFERRING MD:  Horris Latino  CHIEF COMPLAINT: Respiratory Failure  HISTORY OF PRESENT ILLNESS:   ChristineJonesis a84 y.o.female with history of hypertension, hyperlipidemia, thoracic aortic aneurysm, tachy-brady, P.afib on eliquis, GERD presents to the ED, c/o worsening productive cough, SOB, fatigue for the past 3 weeks, denies any fever/chills, chest pain. Pt also reported having black stools about several weeks ago, which have now resolved.  In the ED pt noted to be hypoxic on RA with sats in the 80s, FOBT was noted to be negative, CTA chest done showed multifocal PNA, no PE noted. Pt admitted 4/18 for further management.She was treated with Cefipime, Vanc and Azithro for CAP.Vanc was discontinued after sputum culture resulted.Pt. Has continued to decline with CXR with worsening RUL pneumonia, Increased WOB. PCCM has been asked to assist with care.   PAST MEDICAL HISTORY :  She  has a past medical history of Allergic rhinitis, cause unspecified, Coronary artery calcification seen on CAT scan (06/16/2017), Family history of adverse reaction to anesthesia, Generalized osteoarthrosis, unspecified site, GERD (gastroesophageal reflux disease), Hemorrhage of rectum and anus, History of bronchitis, HTN (hypertension), Hypercholesteremia, Hypertonicity of bladder, Pancreatitis, gallstone, Persistent atrial fibrillation (HCC) (paf), PONV (postoperative nausea and vomiting), Pulmonary HTN ([place]), Renal cell cancer ([place]), Tachy-brady syndrome ([place]), Thoracic aortic aneurysm ([place]), Urinary incontinence, Wears glasses, and Wears partial dentures.  PAST SURGICAL HISTORY: She  has a past surgical history that includes left knee replacement - Dr. French Ana (2012); Total abdominal hysterectomy w/ bilateral salpingoophorectomy; Pacemaker generator change  (04/2013); Laparoscopic cholecystectomy (2007); Tubal ligation; Thumb arthroscopy (2007); Pacemaker insertion (2007); Vein ligation; Proximal interphalangeal fusion (pip) (Right, 08/22/2013); Permanent pacemaker generator change (N/A, 05/03/2013); Abdominal hysterectomy; Colonoscopy; and Total knee arthroplasty (Right, 10/03/2015).  Allergies  Allergen Reactions  . Hydrocodone     Excessive vomiting  . Sulfa Antibiotics Swelling  . Codeine Nausea And Vomiting    No current facility-administered medications on file prior to encounter.    Current Outpatient Medications on File Prior to Encounter  Medication Sig  . amiodarone (PACERONE) 200 MG tablet TAKE ONE TABLET BY MOUTH ONCE DAILY  . apixaban (ELIQUIS) 5 MG TABS tablet Take 1 tablet (5 mg total) by mouth 2 (two) times daily.  Marland Kitchen CARTIA XT 240 MG 24 hr capsule TAKE ONE CAPSULE BY MOUTH ONCE DAILY  . fluticasone (FLONASE) 50 MCG/ACT nasal spray Place 2 sprays into both nostrils daily. (Patient taking differently: Place 2 sprays into both nostrils daily as needed for allergies. )  . loratadine (CLARITIN) 10 MG tablet Take 10 mg by mouth daily as needed for allergies.  . metoprolol succinate (TOPROL-XL) 25 MG 24 hr tablet TAKE ONE TABLET BY MOUTH ONCE DAILY  . OVER THE COUNTER MEDICATION Take 1 tablet by mouth daily. codliver supplement takes daily  . pantoprazole (PROTONIX) 40 MG tablet Take 1 tablet (40 mg total) by mouth 2 (two) times daily.  . polyethylene glycol (MIRALAX / GLYCOLAX) packet Take 17 g by mouth daily. (Patient not taking: Reported on 07/08/2017)    FAMILY HISTORY:  Her indicated that her mother is deceased. She indicated that her father is deceased. She indicated that her maternal grandmother is deceased. She indicated that her maternal grandfather is deceased. She indicated that her paternal grandmother is deceased. She indicated that her paternal grandfather is deceased. She indicated that the status of her other is  unknown.   SOCIAL HISTORY: She  reports that she has never smoked. She has never used smokeless tobacco. She reports that she does not drink alcohol or use drugs.  REVIEW OF SYSTEMS:   Unable due to BiPAP and inability to talk  SUBJECTIVE:  Elderly female on BiPAP, Increased WOB  VITAL SIGNS: BP (!) 158/72   Pulse 72   Temp 98.9 F (37.2 C) (Axillary)   Resp (!) 25   Ht 5\' 5"  (1.651 m)   Wt 173 lb 4.5 oz (78.6 kg)   SpO2 95%   BMI 28.84 kg/m   HEMODYNAMICS:    VENTILATOR SETTINGS: FiO2 (%):  [80 %] 80 %  INTAKE / OUTPUT: I/O last 3 completed shifts: In: 1610 [P.O.:720; IV Piggyback:750] Out: 3300 [Urine:3300]  PHYSICAL EXAMINATION: General:  Awake and lethargic elderly female wearing BiPAP, mild increased WOB Neuro:  Non-focal, following commands HEENT: NCAT, wearing BiPAP mask Cardiovascular:  S1, S2, RRR, No RMG, Right pedal edema noted Lungs:Bilateral chest excursion, crackles noted, diminished per bases Abdomen: Soft, NT, ND, BS +, Obese  Musculoskeletal:  No obvious deformities noted Skin:  Warm dry and intact  LABS:  BMET Recent Labs  Lab 07/10/17 0336 07/11/17 0402 07/12/17 0326  NA 141 144 144  K 3.5 3.6 3.4*  CL 110 111 107  CO2 21* 24 24  BUN 11 15 15   CREATININE 0.66 0.53 0.63  GLUCOSE 128* 133* 146*    Electrolytes Recent Labs  Lab 07/10/17 0336 07/11/17 0402 07/12/17 0326  CALCIUM 8.8* 9.4 9.5    CBC Recent Labs  Lab 07/10/17 0336 07/11/17 0402 07/12/17 0326  WBC 11.2* 11.4* 14.3*  HGB 11.1* 10.6* 10.8*  HCT 32.6* 30.5* 32.1*  PLT 240 225 233    Coag's No results for input(s): APTT, INR in the last 168 hours.  Sepsis Markers Recent Labs  Lab 07/08/17 0108 07/10/17 0802 07/10/17 1021  LATICACIDVEN 1.06  --  1.1  PROCALCITON  --  <0.10  --     ABG Recent Labs  Lab 07/10/17 0910 07/11/17 1550  PHART 7.462* 7.365  PCO2ART 32.3 39.5  PO2ART 54.3* 53.4*    Liver Enzymes Recent Labs  Lab 07/04/2017 2158   AST 35  ALT 23  ALKPHOS 125  BILITOT 1.1  ALBUMIN 3.0*    Cardiac Enzymes No results for input(s): TROPONINI, PROBNP in the last 168 hours.  Glucose No results for input(s): GLUCAP in the last 168 hours.  Imaging No results found.   STUDIES:  CTA  07/08/2017>>No PE, Multifocal right-sided airspace opacification, with underlying crazy paving. Minimal airspace opacity at the left lung apex. This more likely reflects multifocal pneumonia, new from the prior study. Underlying chronic opacities again noted. Mildly enlarged right hilar and mediastinal nodes likely reflect the acute infection. Mild cardiomegaly.   CXR 4/23>>Worsened right upper lobe airspace disease, consistent with pneumonia.  CULTURES: 4/21 Viral panel negative 07/12/2017>> Blood   ANTIBIOTICS: Azithro>> 4/20>> Rocephin>>4/20-4/21 Maxipine>>4/21-4/23 Merrem>> 4/23>> Vanc>> 4/23>>  SIGNIFICANT EVENTS: 4/18 Admission for pneumonia 4/23>> PCCM consulted for worsening pneumonia  LINES/TUBES: PIV x 1  DISCUSSION: 84 year old female with PMH above presenting to PCCM with worsening multi-focal pneumonia and now in respiratory failure requiring BiPAP.  On exam, she is coughing but requiring 80% FiO2 on BiPAP to maintain.  I reviewed CXR myself, worsening multifocal pneumonia.  Discussed with PCCM-NP.  ASSESSMENT / PLAN:  PULMONARY A: Acute hypoxemic respiratory failure Multi-focal pneumonia P:   - BiPAP for comfort at  this point - Family discussing intubation vs BiPAP only and DNR - See ID section - Will give 2 extra doses of lasix - Titrate O2 for sat of 88-92%  CARDIOVASCULAR A:  Essential hypertension Tachycardia from respiratory failure P:  - Hold anti-HTN while being diuresed - Tele monitoring  - D/C lopressor PO and make IV available on a PRN bases  RENAL A:   Hypokalemia P:   - Replace electrolytes as indicated - Lasix 40 mg IV q8 x2 more doses (one already given for a total  of 3 today) - BMET in AM  GASTROINTESTINAL A:   No active issues P:   - NPO while on BiPAP - If to be intubated then will place OGT  HEMATOLOGIC A:   Leukocytosis P:  - CBC in AM - Transfuse per ICU protocol  INFECTIOUS A:   Multi-focal CAP P:   - D/C cefepime - Continue zithromax - Start merrem and vanc - Blood cultures x2 now - PCT protocol  ENDOCRINE A:   No active issues   P:   - Monitor  NEUROLOGIC A:   Unable to speak due to BiPAP, exam non-focal P:   RASS goal: 0 to -2 only if needs intubation - Minimize sedating medications - Monitor in the ICU for airway protection - NPO  FAMILY  - Updates: Family updated at length bedside, patient has a living will that supposedly states no machines per family but they are unsure.  I took the trach/peg option completely off the table.  Short term intubation only if that is the result of the discussion after reviewing the patient's living will.   Awaiting daughter to arrive with the living will.  - Inter-disciplinary family meet or Palliative Care meeting due by:  day 7  The patient is critically ill with multiple organ systems failure and requires high complexity decision making for assessment and support, frequent evaluation and titration of therapies, application of advanced monitoring technologies and extensive interpretation of multiple databases.   Critical Care Time devoted to patient care services described in this note is  60  Minutes. This time reflects time of care of this signee Dr Jennet Maduro. This critical care time does not reflect procedure time, or teaching time or supervisory time of PA/NP/Med student/Med Resident etc but could involve care discussion time.  [name], M.D. [facility] Pulmonary/Critical Care Medicine. Pager: [phone]. After hours pager: [phone].  07/12/2017, 11:13 AM

## 2017-07-12 NOTE — Plan of Care (Signed)
Patient had poor night sleep.  Was coughing a lot even with prns .  Sats improved after diuresis, remained between 89-91 on non re-breather but is looking a bit fatigued by morning and discussed using prn bipap if she gets any more tiered or sats fall lower.

## 2017-07-13 ENCOUNTER — Inpatient Hospital Stay (HOSPITAL_COMMUNITY): Payer: Medicare Other

## 2017-07-13 LAB — BLOOD GAS, ARTERIAL
Acid-Base Excess: 8.8 mmol/L — ABNORMAL HIGH (ref 0.0–2.0)
Bicarbonate: 33 mmol/L — ABNORMAL HIGH (ref 20.0–28.0)
DRAWN BY: 225631
Delivery systems: POSITIVE
EXPIRATORY PAP: 5
FIO2: 100
Inspiratory PAP: 8
MECHVT: 0.57 mL
Mechanical Rate: 8
O2 SAT: 93.2 %
PATIENT TEMPERATURE: 98.4
PH ART: 7.483 — AB (ref 7.350–7.450)
PO2 ART: 64.3 mmHg — AB (ref 83.0–108.0)
RATE: 35 resp/min
pCO2 arterial: 44.5 mmHg (ref 32.0–48.0)

## 2017-07-13 LAB — CBC WITH DIFFERENTIAL/PLATELET
BASOS ABS: 0 10*3/uL (ref 0.0–0.1)
Basophils Relative: 0 %
EOS PCT: 0 %
Eosinophils Absolute: 0 10*3/uL (ref 0.0–0.7)
HCT: 32.6 % — ABNORMAL LOW (ref 36.0–46.0)
Hemoglobin: 10.7 g/dL — ABNORMAL LOW (ref 12.0–15.0)
LYMPHS ABS: 0.6 10*3/uL — AB (ref 0.7–4.0)
LYMPHS PCT: 4 %
MCH: 30.2 pg (ref 26.0–34.0)
MCHC: 32.8 g/dL (ref 30.0–36.0)
MCV: 92.1 fL (ref 78.0–100.0)
MONO ABS: 0.9 10*3/uL (ref 0.1–1.0)
Monocytes Relative: 6 %
Neutro Abs: 13.4 10*3/uL — ABNORMAL HIGH (ref 1.7–7.7)
Neutrophils Relative %: 90 %
PLATELETS: 250 10*3/uL (ref 150–400)
RBC: 3.54 MIL/uL — ABNORMAL LOW (ref 3.87–5.11)
RDW: 16 % — AB (ref 11.5–15.5)
WBC: 15 10*3/uL — AB (ref 4.0–10.5)

## 2017-07-13 LAB — BASIC METABOLIC PANEL
Anion gap: 13 (ref 5–15)
BUN: 18 mg/dL (ref 6–20)
CALCIUM: 9.7 mg/dL (ref 8.9–10.3)
CO2: 30 mmol/L (ref 22–32)
Chloride: 104 mmol/L (ref 101–111)
Creatinine, Ser: 0.62 mg/dL (ref 0.44–1.00)
GFR calc Af Amer: >60 mL/min (ref >60)
GLUCOSE: 114 mg/dL — AB (ref 65–99)
Potassium: 3.5 mmol/L (ref 3.5–5.1)
SODIUM: 147 mmol/L — AB (ref 135–145)

## 2017-07-13 LAB — CULTURE, BLOOD (ROUTINE X 2)
CULTURE: NO GROWTH
Culture: NO GROWTH
Special Requests: ADEQUATE
Special Requests: ADEQUATE

## 2017-07-13 LAB — PROCALCITONIN: PROCALCITONIN: 0.44 ng/mL

## 2017-07-13 LAB — PHOSPHORUS: Phosphorus: 3.7 mg/dL (ref 2.5–4.6)

## 2017-07-13 LAB — MAGNESIUM: Magnesium: 1.7 mg/dL (ref 1.7–2.4)

## 2017-07-13 MED ORDER — FUROSEMIDE 10 MG/ML IJ SOLN
INTRAMUSCULAR | Status: AC
  Filled 2017-07-13: qty 4

## 2017-07-13 MED ORDER — FUROSEMIDE 10 MG/ML IJ SOLN
40.0000 mg | Freq: Once | INTRAMUSCULAR | Status: AC
  Administered 2017-07-13: 40 mg via INTRAVENOUS

## 2017-07-13 NOTE — Progress Notes (Addendum)
PULMONARY / CRITICAL CARE MEDICINE   Name: Molly Taylor MRN: [id number] DOB: 02-Aug-1926    ADMISSION DATE:  06/27/2017 CONSULTATION DATE: 07/12/2017  REFERRING MD:  Horris Latino  CHIEF COMPLAINT: Respiratory Failure  HISTORY OF PRESENT ILLNESS:   ChristineJonesis a47 y.o.female with history of hypertension, hyperlipidemia, thoracic aortic aneurysm, tachy-brady, P.afib on eliquis, GERD presents to the ED, c/o worsening productive cough, SOB, fatigue for the past 3 weeks, denies any fever/chills, chest pain. Pt also reported having black stools about several weeks ago, which have now resolved.  In the ED pt noted to be hypoxic on RA with sats in the 80s, FOBT was noted to be negative, CTA chest done showed multifocal PNA, no PE noted. Pt admitted 4/18 for further management.She was treated with Cefipime, Vanc and Azithro for CAP.Vanc was discontinued after sputum culture resulted.Pt. Has continued to decline with CXR with worsening RUL pneumonia, Increased WOB. PCCM has been asked to assist with care.   SUBJECTIVE:  Now limited code, DNI per family discussions yesterday.  No acute change overnight.   Remains on bipap.  "I dont feel good".  Some SOB, feels like bipap helps WOB. -2 L with diuresis yesterday  VITAL SIGNS: BP (!) 113/58   Pulse 77   Temp 98.9 F (37.2 C) (Axillary)   Resp (!) 28   Ht 5\' 5"  (1.651 m)   Wt 78.6 kg (173 lb 4.5 oz)   SpO2 92%   BMI 28.84 kg/m   HEMODYNAMICS:    VENTILATOR SETTINGS: FiO2 (%):  [100 %] 100 %  INTAKE / OUTPUT: I/O last 3 completed shifts: In: 8676 [P.O.:100; I.V.:375; IV Piggyback:700] Out: 5400 [Urine:5400]  PHYSICAL EXAMINATION: General: Pleasant, elderly female, no acute distress on BiPAP Neuro: Drowsy but wakes easily, follows commands, answers questions appropriately HEENT: Mucous membranes moist, BiPAP Cardiovascular: S1-S2 regular rate and rhythm  Lungs: Respirations are even, mildly labored on BiPAP, few scattered  rhonchi Abdomen: Soft, NT, ND, BS +, Obese  Musculoskeletal: Warm and dry, scant BLE edema   LABS:  BMET Recent Labs  Lab 07/11/17 0402 07/12/17 0326 07/13/17 0305  NA 144 144 147*  K 3.6 3.4* 3.5  CL 111 107 104  CO2 24 24 30   BUN 15 15 18   CREATININE 0.53 0.63 0.62  GLUCOSE 133* 146* 114*    Electrolytes Recent Labs  Lab 07/11/17 0402 07/12/17 0326 07/13/17 0305  CALCIUM 9.4 9.5 9.7  MG  --   --  1.7  PHOS  --   --  3.7    CBC Recent Labs  Lab 07/11/17 0402 07/12/17 0326 07/13/17 0305  WBC 11.4* 14.3* 15.0*  HGB 10.6* 10.8* 10.7*  HCT 30.5* 32.1* 32.6*  PLT 225 233 250    Coag's No results for input(s): APTT, INR in the last 168 hours.  Sepsis Markers Recent Labs  Lab 07/08/17 0108 07/10/17 0802 07/10/17 1021 07/12/17 1244 07/13/17 0305  LATICACIDVEN 1.06  --  1.1  --   --   PROCALCITON  --  <0.10  --  0.19 0.44    ABG Recent Labs  Lab 07/10/17 0910 07/11/17 1550 07/13/17 0500  PHART 7.462* 7.365 7.483*  PCO2ART 32.3 39.5 44.5  PO2ART 54.3* 53.4* 64.3*    Liver Enzymes Recent Labs  Lab 06/21/2017 2158  AST 35  ALT 23  ALKPHOS 125  BILITOT 1.1  ALBUMIN 3.0*    Cardiac Enzymes No results for input(s): TROPONINI, PROBNP in the last 168 hours.  Glucose No results for input(s):  GLUCAP in the last 168 hours.  Imaging No results found.   STUDIES:  CTA  07/08/2017>>No PE, Multifocal right-sided airspace opacification, with underlying crazy paving. Minimal airspace opacity at the left lung apex. This more likely reflects multifocal pneumonia, new from the prior study. Underlying chronic opacities again noted. Mildly enlarged right hilar and mediastinal nodes likely reflect the acute infection. Mild cardiomegaly.   CXR 4/23>>Worsened right upper lobe airspace disease, consistent with pneumonia.  CULTURES: 4/21 Viral panel negative 07/12/2017>> Blood   ANTIBIOTICS: Azithro>>  4/20>> Rocephin>>4/20-4/21 Maxipine>>4/21-4/23 Merrem>> 4/23>> Vanc>> 4/23>>  SIGNIFICANT EVENTS: 4/18 Admission for pneumonia 4/23>> PCCM consulted for worsening pneumonia  LINES/TUBES: PIV x 1  DISCUSSION: 84 year old female with history of hypertension, tachybradycardia syndrome, A. fib, GERD admitted 4/18 With community acquired pneumonia.  Has had worsening respiratory status over the last several days, now requiring continuous BiPAP.  Per family discussions patient is now limited code, DNR.  BiPAP only.   ASSESSMENT / PLAN:  PULMONARY A: Acute hypoxemic respiratory failure Multi-focal pneumonia P:   Continue BiPAP as needed for comfort/increased work of breathing DNI Antibiotics as above Continue gentle diuresis as blood pressure tolerates If no significant improvement over next 24 hours with diuresis and antibiotics may need to consider transition to more comfort based approach  CARDIOVASCULAR A:  Essential hypertension Tachycardia from respiratory failure PAF P:  Hold home antihypertensives Continue gentle diuresis as blood pressure tolerates PRN metoprolol   RENAL A:   Hypokalemia Hyponatremia P:   We will give Lasix 40 mg IV x1 today Follow-up chemistry Replace electrolytes as needed    GASTROINTESTINAL A:   No active issues P:   N.p.o. while on BiPAP   HEMATOLOGIC A:   Leukocytosis P:  Follow-up CBC   INFECTIOUS A:   Multi-focal CAP P:   Continue meropenem, vancomycin, and azithromycin follow cultures Trend procalcitonin As above   ENDOCRINE A:   No active issues   P:   Monitor glucose on chemistry  NEUROLOGIC A:   Altered mental status-related to respiratory failure P:    Supportive care Minimize sedating medication N.p.o. for now  FAMILY  - Updates:  No family available for/24.  Family updated at length bedside 4/23.  LCB, DNI.  Bipap only, no escalation.   - Inter-disciplinary family meet or Palliative  Care meeting due by:  day 7   Continue current therapy as above.  If no significant improvement in respiratory status over next 24 hours in the need to consider transition to comfort, morphine for work of breathing, DC BiPAP.  Molly Madrid, NP 07/13/2017  9:16 AM Pager: [phone] or [phone]  Attending Note:  84 year old female with non-resolving pneumonia that PCCM was consulted for on 4/23 for respiratory failure.  On exam, patient is comfortable on BiPAP with coarse BS diffusely.  I reviewed CXR myself, diffuse infiltrate noted.  Discussed with PCCM-NP.  Will given an additional dose of lasix.  Continue broad spectrum abx.  F/u on cultures.  Will continue above measures.  Spoke with family, they will inform us if patient deteriorates and ask for morphine.  She appears ok for now.  PCCM will continue to follow.  The patient is critically ill with multiple organ systems failure and requires high complexity decision making for assessment and support, frequent evaluation and titration of therapies, application of advanced monitoring technologies and extensive interpretation of multiple databases.   Critical Care Time devoted to patient care services described in this note is  35  Minutes. This time reflects time of care of this signee Dr Molly Taylor. This critical care time does not reflect procedure time, or teaching time or supervisory time of PA/NP/Med student/Med Resident etc but could involve care discussion time.  [name], M.D. [facility] Pulmonary/Critical Care Medicine. Pager: [phone]. After hours pager: [phone].

## 2017-07-13 NOTE — Progress Notes (Signed)
[facility] Progress Note  Patient: Molly Taylor GYJ:[id number]   PCP: Alroy Dust, L.Marlou Sa, MD DOB: 1927/01/06   DOA: 07/13/2017   DOS: 07/13/2017   Date of Service: the patient was seen and examined on 07/13/2017  Subjective: Feeling better, breathing somewhat better than yesterday.  No nausea no vomiting no chest pain.  On BiPAP throughout the night.  Brief hospital course: Pt. with PMH of HTN, HLD, A. fib on Eliquis, GERD; admitted on 07/05/2017, presented with complaint of cough and shortness of breath, was found to have multifocal pneumonia. Currently further plan is continue BiPAP.  Assessment and Plan: 1.  Acute hypoxic respiratory failure. Community-acquired multifocal pneumonia. Sepsis due to above. Acute on chronic diastolic CHF Procalcitonin mildly elevated. Chest x-ray shows worsening pneumonia on the right side. Blood cultures negative so far. UA shows no evidence of UTI. PCCM consulted due to severe hypoxia. Antibiotics broadened to vancomycin and meropenem along with azithromycin. Currently on BiPAP. ABG this morning shows persistent hypoxia despite 100% FiO2. ?  Steroids, will defer to PCCM. Developed volume overload due to aggressive IV hydration.  Currently responding to IV Lasix.  Monitor.  2.  Lower GI bleed. Intermittent BRBPR.  H&H relatively stable. CT abdomen and pelvis shows no acute abnormality. FOBT negative. H&H stable. Started on PPI which we will continue. Initially Eliquis was on hold, now resumed.  3.  Paroxysmal A. fib. Currently paced rhythm. Continue amiodarone,?  Amiodarone induced lung toxicity. Continue Cardizem and metoprolol as needed. Also continue anticoagulation.  4.  Essential hypertension. Blood pressure stable. Continue to monitor.  5.  Hypokalemia. Replacing.  Diet: Cardiac diet, aspiration precaution DVT Prophylaxis: on therapeutic anticoagulation.  Advance goals of care discussion: Based on discussion with PCCM,  partial code, okay to use BiPAP and medications.  Family Communication: no family was present at bedside, at the time of interview.   Disposition:  Discharge to be determined.  Consultants: PCCM  Procedures: none  Antibiotics: Anti-infectives (From admission, onward)   Start     Dose/Rate Route Frequency Ordered Stop   07/13/17 1500  vancomycin (VANCOCIN) IVPB 750 mg/150 ml premix     750 mg 150 mL/hr over 60 Minutes Intravenous Every 24 hours 07/12/17 1506     07/12/17 1400  vancomycin (VANCOCIN) 1,500 mg in sodium chloride 0.9 % 500 mL IVPB     1,500 mg 250 mL/hr over 120 Minutes Intravenous  Once 07/12/17 1149 07/12/17 1712   07/12/17 1300  meropenem (MERREM) 1 g in sodium chloride 0.9 % 100 mL IVPB     1 g 200 mL/hr over 30 Minutes Intravenous Every 12 hours 07/12/17 1149     07/10/17 2200  ceFEPIme (MAXIPIME) 1 g in sodium chloride 0.9 % 100 mL IVPB  Status:  Discontinued     1 g 200 mL/hr over 30 Minutes Intravenous Every 8 hours 07/10/17 0938 07/12/17 1134   07/10/17 1400  ceFEPIme (MAXIPIME) 2 g in sodium chloride 0.9 % 100 mL IVPB     2 g 200 mL/hr over 30 Minutes Intravenous  Once 07/10/17 1242 07/10/17 1415   07/10/17 0930  ceFEPIme (MAXIPIME) 2 g in sodium chloride 0.9 % 100 mL IVPB  Status:  Discontinued     2 g 200 mL/hr over 30 Minutes Intravenous  Once 07/10/17 0910 07/10/17 1248   07/08/17 2359  azithromycin (ZITHROMAX) 500 mg in sodium chloride 0.9 % 250 mL IVPB     500 mg 250 mL/hr over 60 Minutes Intravenous Every 24 hours  07/08/17 0537 07/15/17 2359   07/08/17 2200  cefTRIAXone (ROCEPHIN) 1 g in sodium chloride 0.9 % 100 mL IVPB  Status:  Discontinued     1 g 200 mL/hr over 30 Minutes Intravenous Every 24 hours 07/08/17 0537 07/10/17 0840   07/08/17 0100  azithromycin (ZITHROMAX) 500 MG injection    Note to Pharmacy:  Bonney Roussel   : cabinet override      07/08/17 0100 07/08/17 1314   07/08/17 0030  cefTRIAXone (ROCEPHIN) 1 g in sodium chloride 0.9 % 100  mL IVPB     1 g 200 mL/hr over 30 Minutes Intravenous  Once 07/08/17 0023 07/08/17 0201   07/08/17 0030  azithromycin (ZITHROMAX) 500 mg in sodium chloride 0.9 % 250 mL IVPB     500 mg 250 mL/hr over 60 Minutes Intravenous  Once 07/08/17 0023 07/08/17 0330       Objective: Physical Exam: Vitals:   07/13/17 1000 07/13/17 1100 07/13/17 1200 07/13/17 1206  BP: 123/60 (!) 150/72 135/65 (!) 152/70  Pulse: 83 84 84 85  Resp: (!) 34 (!) 27 (!) 22 (!) 25  Temp:   98.2 F (36.8 C)   TempSrc:   Axillary   SpO2: 92% 92% 94% 93%  Weight:      Height:        Intake/Output Summary (Last 24 hours) at 07/13/2017 1259 Last data filed at 07/13/2017 1200 Gross per 24 hour  Intake 945 ml  Output 3900 ml  Net -2955 ml   Filed Weights   06/21/2017 2003 07/08/17 0344 07/10/17 0414  Weight: 72.6 kg (160 lb) 74.4 kg (164 lb 0.4 oz) 78.6 kg (173 lb 4.5 oz)   General: Alert, Awake and Oriented to Time, Place and Person. Appear in marked distress, affect appropriate Eyes: PERRL, Conjunctiva normal ENT: Oral Mucosa clear moist. Neck: difficult to assess JVD, no Abnormal Mass Or lumps Cardiovascular: S1 and S2 Present, no Murmur, Peripheral Pulses Present Respiratory: increased respiratory effort, Bilateral Air entry equal and Decreased, bno use of accessory muscle, bilateral  Crackles, no wheezes Abdomen: Bowel Sound present, Soft and no tenderness, no hernia Skin: no redness, no Rash, no induration Extremities: no Pedal edema, no calf tenderness Neurologic: Grossly no focal neuro deficit. Bilaterally Equal motor strength  Data Reviewed: CBC: Recent Labs  Lab 07/09/17 0305 07/10/17 0336 07/11/17 0402 07/12/17 0326 07/13/17 0305  WBC 8.9 11.2* 11.4* 14.3* 15.0*  NEUTROABS 7.0 9.3* 9.5* 12.4* 13.4*  HGB 10.0* 11.1* 10.6* 10.8* 10.7*  HCT 29.0* 32.6* 30.5* 32.1* 32.6*  MCV 89.8 90.8 90.5 90.9 92.1  PLT 196 240 225 233 595   Basic Metabolic Panel: Recent Labs  Lab 07/09/17 0305  07/10/17 0336 07/11/17 0402 07/12/17 0326 07/13/17 0305  NA 145 141 144 144 147*  K 3.7 3.5 3.6 3.4* 3.5  CL 115* 110 111 107 104  CO2 16* 21* 24 24 30   GLUCOSE 115* 128* 133* 146* 114*  BUN 8 11 15 15 18   CREATININE 0.64 0.66 0.53 0.63 0.62  CALCIUM 8.7* 8.8* 9.4 9.5 9.7  MG  --   --   --   --  1.7  PHOS  --   --   --   --  3.7    Liver Function Tests: Recent Labs  Lab 07/19/2017 2158  AST 35  ALT 23  ALKPHOS 125  BILITOT 1.1  PROT 7.1  ALBUMIN 3.0*   No results for input(s): LIPASE, AMYLASE in the last 168 hours. No results  for input(s): AMMONIA in the last 168 hours. Coagulation Profile: No results for input(s): INR, PROTIME in the last 168 hours. Cardiac Enzymes: No results for input(s): CKTOTAL, CKMB, CKMBINDEX, TROPONINI in the last 168 hours. BNP (last 3 results) No results for input(s): PROBNP in the last 8760 hours. CBG: No results for input(s): GLUCAP in the last 168 hours. Studies: Dg Chest Port 1 View  Result Date: 07/13/2017 CLINICAL DATA:  Respiratory failure, intubated patient, community-acquired pneumonia. EXAM: PORTABLE CHEST 1 VIEW COMPARISON:  Portable chest x-ray of July 10, 2017 FINDINGS: The lungs are well-expanded. Confluent airspace opacity has worsened on the right and involves the entire lung. Increased interstitial density on the left has developed as well. The cardiac silhouette remains enlarged. The pulmonary vascularity is mildly engorged and indistinct. There is calcification in the wall of the aortic arch. IMPRESSION: Worsening of bilateral pneumonia. One cannot exclude a component of pulmonary edema in the appropriate clinical setting. Thoracic aortic atherosclerosis. Electronically Signed   By: David  Martinique M.D.   On: 07/13/2017 10:35    Scheduled Meds: . amiodarone  200 mg Oral Daily  . apixaban  5 mg Oral BID  . diltiazem  240 mg Oral Daily  . feeding supplement (ENSURE ENLIVE)  237 mL Oral Q24H  . ipratropium-albuterol  3 mL  Nebulization TID  . mouth rinse  15 mL Mouth Rinse BID  . pantoprazole  40 mg Oral BID   Continuous Infusions: . azithromycin Stopped (07/13/17 0025)  . meropenem (MERREM) IV Stopped (07/13/17 1236)  . vancomycin     PRN Meds: acetaminophen, fluticasone, guaiFENesin-dextromethorphan, ipratropium-albuterol, loratadine, metoprolol tartrate  Time spent: 35 minutes  Author: Berle Mull, MD [facility] Pager: [phone] 07/13/2017 12:59 PM  If 7PM-7AM, please contact night-coverage at [email or web address], password [email or web address]

## 2017-07-13 NOTE — Progress Notes (Signed)
OT Cancellation Note  Patient Details Name: KEMI GELL MRN: [id number] DOB: 13-Jan-1927   Cancelled Treatment:    Reason Eval/Treat Not Completed: Medical issues which prohibited therapy. Pt on bipap at this time.  Deni Lefever 07/13/2017, 10:14 AM  Lesle Chris, OTR/L [phone] 07/13/2017

## 2017-07-13 NOTE — Progress Notes (Signed)
PT Cancellation Note  Patient Details Name: Molly Taylor MRN: [id number] DOB: [birth year]   Cancelled Treatment:    Reason Eval/Treat Not Completed: Medical issues which prohibited therapy, on BiPAP   [name] 07/13/2017, 2:31 PM  Tresa Endo PT [phone]

## 2017-07-14 LAB — CBC WITH DIFFERENTIAL/PLATELET
BASOS ABS: 0 10*3/uL (ref 0.0–0.1)
Basophils Relative: 0 %
EOS ABS: 0 10*3/uL (ref 0.0–0.7)
EOS PCT: 0 %
HCT: 34.1 % — ABNORMAL LOW (ref 36.0–46.0)
Hemoglobin: 11.2 g/dL — ABNORMAL LOW (ref 12.0–15.0)
LYMPHS PCT: 4 %
Lymphs Abs: 0.6 10*3/uL — ABNORMAL LOW (ref 0.7–4.0)
MCH: 30 pg (ref 26.0–34.0)
MCHC: 32.8 g/dL (ref 30.0–36.0)
MCV: 91.4 fL (ref 78.0–100.0)
Monocytes Absolute: 0.9 10*3/uL (ref 0.1–1.0)
Monocytes Relative: 6 %
NEUTROS PCT: 90 %
Neutro Abs: 14.2 10*3/uL — ABNORMAL HIGH (ref 1.7–7.7)
PLATELETS: 255 10*3/uL (ref 150–400)
RBC: 3.73 MIL/uL — AB (ref 3.87–5.11)
RDW: 16.2 % — ABNORMAL HIGH (ref 11.5–15.5)
WBC: 15.7 10*3/uL — AB (ref 4.0–10.5)

## 2017-07-14 LAB — PROCALCITONIN: PROCALCITONIN: 0.57 ng/mL

## 2017-07-14 LAB — BASIC METABOLIC PANEL
ANION GAP: 14 (ref 5–15)
BUN: 30 mg/dL — ABNORMAL HIGH (ref 6–20)
CALCIUM: 10.3 mg/dL (ref 8.9–10.3)
CO2: 31 mmol/L (ref 22–32)
Chloride: 101 mmol/L (ref 101–111)
Creatinine, Ser: 0.66 mg/dL (ref 0.44–1.00)
GFR calc non Af Amer: >60 mL/min (ref >60)
Glucose, Bld: 113 mg/dL — ABNORMAL HIGH (ref 65–99)
Potassium: 3.1 mmol/L — ABNORMAL LOW (ref 3.5–5.1)
Sodium: 146 mmol/L — ABNORMAL HIGH (ref 135–145)

## 2017-07-14 LAB — MAGNESIUM: Magnesium: 2.2 mg/dL (ref 1.7–2.4)

## 2017-07-14 MED ORDER — MORPHINE 100MG IN NS 100ML (1MG/ML) PREMIX INFUSION
10.0000 mg/h | INTRAVENOUS | Status: DC
  Administered 2017-07-14: 10 mg/h via INTRAVENOUS
  Filled 2017-07-14: qty 100

## 2017-07-14 MED ORDER — POTASSIUM CHLORIDE CRYS ER 20 MEQ PO TBCR: 40.0000 meq | EXTENDED_RELEASE_TABLET | Freq: Once | ORAL | Status: DC

## 2017-07-14 MED ORDER — POTASSIUM CHLORIDE 10 MEQ/100ML IV SOLN
10.0000 meq | INTRAVENOUS | Status: DC
  Administered 2017-07-14: 10 meq via INTRAVENOUS
  Filled 2017-07-14 (×2): qty 100

## 2017-07-14 MED ORDER — ACETAMINOPHEN 650 MG RE SUPP: 650.0000 mg | RECTAL | Status: DC | PRN

## 2017-07-14 MED ORDER — MORPHINE BOLUS VIA INFUSION
5.0000 mg | INTRAVENOUS | Status: DC | PRN
  Administered 2017-07-14: 10 mg via INTRAVENOUS
  Administered 2017-07-14: 5 mg via INTRAVENOUS
  Filled 2017-07-14: qty 20

## 2017-07-14 MED ORDER — FAMOTIDINE IN NACL 20-0.9 MG/50ML-% IV SOLN: 20.0000 mg | Freq: Every day | INTRAVENOUS | Status: DC

## 2017-07-14 MED ORDER — TALC (STERITALC) POWDER FOR INTRAPLEURAL USE
4.0000 g | Freq: Once | INTRAVENOUS | Status: DC
  Filled 2017-07-14: qty 4

## 2017-07-14 MED ORDER — POTASSIUM CHLORIDE CRYS ER 20 MEQ PO TBCR
40.0000 meq | EXTENDED_RELEASE_TABLET | Freq: Once | ORAL | Status: DC
  Filled 2017-07-14: qty 2

## 2017-07-14 MED ORDER — POTASSIUM CHLORIDE 20 MEQ/15ML (10%) PO SOLN
40.0000 meq | Freq: Once | ORAL | Status: AC
  Administered 2017-07-14: 40 meq via ORAL
  Filled 2017-07-14: qty 30

## 2017-07-15 LAB — CULTURE, BLOOD (ROUTINE X 2)
CULTURE: NO GROWTH
Culture: NO GROWTH
SPECIAL REQUESTS: ADEQUATE
Special Requests: ADEQUATE

## 2017-07-17 LAB — CULTURE, BLOOD (ROUTINE X 2)
Culture: NO GROWTH
Culture: NO GROWTH
Special Requests: ADEQUATE

## 2017-07-18 LAB — CUP PACEART REMOTE DEVICE CHECK
Battery Remaining Longevity: 88 mo
Battery Voltage: 2.79 V
Brady Statistic AP VS Percent: 0 %
Brady Statistic AS VP Percent: 0 %
Brady Statistic AS VS Percent: 0 %
Date Time Interrogation Session: 20190408150551
Implantable Lead Implant Date: 20070313
Implantable Lead Location: 753860
Implantable Lead Model: 5076
Implantable Lead Model: 5092
Lead Channel Impedance Value: 596 Ohm
Lead Channel Pacing Threshold Amplitude: 0.75 V
Lead Channel Pacing Threshold Pulse Width: 0.4 ms
Lead Channel Pacing Threshold Pulse Width: 0.4 ms
Lead Channel Setting Pacing Pulse Width: 0.4 ms
MDC IDC LEAD IMPLANT DT: 20070313
MDC IDC LEAD LOCATION: 753859
MDC IDC MSMT BATTERY IMPEDANCE: 331 Ohm
MDC IDC MSMT LEADCHNL RA IMPEDANCE VALUE: 452 Ohm
MDC IDC MSMT LEADCHNL RV PACING THRESHOLD AMPLITUDE: 1 V
MDC IDC PG IMPLANT DT: 20150212
MDC IDC SET LEADCHNL RA PACING AMPLITUDE: 2 V
MDC IDC SET LEADCHNL RV PACING AMPLITUDE: 2.5 V
MDC IDC SET LEADCHNL RV SENSING SENSITIVITY: 5.6 mV
MDC IDC STAT BRADY AP VP PERCENT: 100 %

## 2017-07-20 NOTE — Progress Notes (Signed)
Patient pronunced dead at 1414 by Charlie Pitter RNn and Earma Reading RN. No B/P ,pulse or respirations noted. Dr Nelda Marseille and Dr Posey Pronto  notifed

## 2017-07-20 NOTE — Progress Notes (Signed)
Chaplain at bedside providing support with extended family around transition to comfort care / end of life.    Provided pastoral presence and support, normalizing grief response and opening space for family to engage in life review, eulogize pt and support one another.  Shared prayers with this family at bedside.     [facility] / [facility] Chaplain Jerene Pitch, MDiv, [facility]

## 2017-07-20 NOTE — Progress Notes (Signed)
Dr Posey Pronto notifed pt is very lethargic and unable to take morning meds crushed in applesauce. Orders received.

## 2017-07-20 NOTE — Progress Notes (Deleted)
Patient pronounced dead at 1411. No b/p, no pulse or respirations noted. Pt pronounced dead by Charlie Pitter RN and Earma Reading RN.  Dr. Nelda Marseille and Dr Posey Pronto notified of death. Family was at bedside.

## 2017-07-20 NOTE — Progress Notes (Signed)
Physical Therapy Discharge Patient Details Name: Molly Taylor MRN: [id number] DOB: 09/27/1926 Today's Date: Jul 26, 2017 Time:  -     Patient discharged from PT services secondary to medical decline - will need to re-order PT to resume therapy services.remains on BiPAP.    GP     Marcelino Freestone PT [phone]  2017/07/26, 8:04 AM

## 2017-07-20 NOTE — Discharge Summary (Signed)
[facility] Death Summary   Patient: Molly Taylor   PCP: Alroy Dust, L.Marlou Sa, MD DOB: [birth year]   Date of admission: 15-Jul-2017   Date and time of death: 2017-07-22 14:14   Hospital Diagnoses:  Principal Problem:   Community acquired pneumonia of right upper lobe of lung ([place]) Active Problems:   Rectal bleeding   Anemia   Acute respiratory failure with hypoxemia ([place])   Acute encephalopathy   Palliative care encounter   Goals of care, counseling/discussion  History of present illness: As per the H and P dictated on admission, "Molly Taylor  is a 84 y.o. female, w Jerrye Bushy, hypertension, hyperlipidemia, thoracic aortic aneurysm, tachy-brady, Pafib on eliquis apparently c/o black stool starting several weeks ago, and then her stool turned brown and over the past 1 week she has had slight brbpr.  Pt denies fever, chills, n/v,  abd pain, diarrhea, constipation.   Pt notes slight cough over the past 3 weeks, yellow sputum.  Denies fever, chills, cp, palp, sob.  Pt presented due to rectal bleeding"  Hospital Course:  Pt. with PMH of HTN, HLD, A. fib on Eliquis, GERD; admitted on 07-15-17, presented with complaint of cough and shortness of breath, was found to have multifocal pneumonia. Pt was treated with Cefipime, Vanc and Azithro for CAP.Vanc was discontinued after sputum culture resulted.Pt. Has continued to decline with CXR with worsening RUL pneumonia, Increased WOB. PCCM was asked to assist with care. Pt was placed on bipap but did not improve upon and started getting more lethargic. PCCM recommended comfort care and family agreed with the transition. Pt was placed on morphine infusion for comfort measure per PCCM and The patient was pronounced deceased at 14:14, on Jul 22, 2017. Summary of her hospital problems is as per below.   1.  Acute hypoxic respiratory failure. Community-acquired multifocal pneumonia. Sepsis due to above. Acute on chronic diastolic CHF  2.   Lower GI bleed. Intermittent BRBPR.  H&H relatively stable. Started on PPI. Initially Eliquis was on hold, but later resumed.  3.  Paroxysmal A. fib. Was on amiodarone Cardizem and metoprolol as needed. Also on anticoagulation.  4.  Essential hypertension. Blood pressure was stable.  5.  Hypokalemia.   Procedures and Results:  none   Consultations:  PCCM   The results of significant diagnostics from this hospitalization (including imaging, microbiology, ancillary and laboratory) are listed below for reference.    Significant Diagnostic Studies: Dg Chest 2 View  Result Date: Jul 15, 2017 CLINICAL DATA:  Bright red blood per rectum. EXAM: CHEST - 2 VIEW COMPARISON:  05/23/2017 CXR, 06/14/2017 chest CT FINDINGS: Cardiomegaly with aortic atherosclerosis, stable in appearance. Left-sided pacemaker apparatus with leads in the right atrium and right ventricle are noted. There is bibasilar atelectasis. Pulmonary consolidation in the right upper lobe consistent with pneumonia is identified, a new finding since prior. Minimal blunting of the costophrenic angles may reflect tiny pleural effusions and/or atelectasis. No overt pulmonary edema. Degenerative changes are present along the dorsal spine. IMPRESSION: New right upper lobe pulmonary consolidation consistent with pneumonia. Bibasilar subsegmental atelectasis. Stable cardiomegaly with aortic atherosclerosis. Electronically Signed   By: Ashley Royalty M.D.   On: 07/15/2017 23:57   Ct Angio Chest Pe W And/or Wo Contrast  Result Date: 07/08/2017 CLINICAL DATA:  Subacute onset of bright red rectal bleeding. Atrial fibrillation. Mild hypoxia. Pneumonia. Elevated D-dimer. EXAM: CT ANGIOGRAPHY CHEST CT ABDOMEN AND PELVIS WITH CONTRAST TECHNIQUE: Multidetector CT imaging of the chest was performed using the standard protocol during  bolus administration of intravenous contrast. Multiplanar CT image reconstructions and MIPs were obtained to evaluate  the vascular anatomy. Multidetector CT imaging of the abdomen and pelvis was performed using the standard protocol during bolus administration of intravenous contrast. CONTRAST:  167mL ISOVUE-370 IOPAMIDOL (ISOVUE-370) INJECTION 76% COMPARISON:  CT of the abdomen performed 03/25/2005, and CTA of the chest performed 06/14/2017 FINDINGS: CTA CHEST FINDINGS Cardiovascular:  There is no evidence of pulmonary embolus. The heart is mildly enlarged. Mild calcification is noted along the thoracic aorta. The great vessels are grossly unremarkable in appearance. A pacemaker is noted at the left chest wall, with leads ending at the right atrium and right ventricle. There is mild prominence of the ascending thoracic aorta to 4.3 cm in AP dimension, which is likely within physiologic limits for the patient's age. Mediastinum/Nodes: Mildly enlarged right hilar and mediastinal nodes are seen, measuring up to 1.2 cm in short axis. Prominent nodes are noted at the right paratracheal region, subcarinal region and azygoesophageal recess. No pericardial effusion is identified. The thyroid gland is unremarkable. No axillary lymphadenopathy is seen. Lungs/Pleura: Multifocal right-sided airspace opacification is noted, with some degree of crazy paving. Minimal opacity is also noted at the left lung apex. This more likely reflects pneumonia. Chronic bibasilar airspace opacities are again noted. No pleural effusion or pneumothorax is seen. Musculoskeletal: No acute osseous abnormalities are identified. The visualized musculature is unremarkable in appearance. Review of the MIP images confirms the above findings. CT ABDOMEN and PELVIS FINDINGS Hepatobiliary: Scattered nonspecific hypodensities within the liver measure up to 1.9 cm in size. The liver is otherwise unremarkable. The patient is status post cholecystectomy, with clips noted at the gallbladder fossa. The common bile duct is normal in caliber. Pancreas: The pancreas is within  normal limits. Spleen: The spleen is unremarkable in appearance. Adrenals/Urinary Tract: The adrenal glands are unremarkable in appearance. A large left renal cyst is noted, with minimal peripheral calcification. There is no evidence of hydronephrosis. No renal or ureteral stones are identified. No perinephric stranding is seen. Stomach/Bowel: The stomach is unremarkable in appearance. The small bowel is within normal limits. The appendix is normal in caliber, without evidence of appendicitis. The colon is unremarkable in appearance. Vascular/Lymphatic: Scattered calcification is seen along the abdominal aorta and its branches. The abdominal aorta is otherwise grossly unremarkable. The inferior vena cava is grossly unremarkable. No retroperitoneal lymphadenopathy is seen. No pelvic sidewall lymphadenopathy is identified. Reproductive: The bladder is mildly distended and grossly unremarkable. The patient is status post hysterectomy. No suspicious adnexal masses are seen. Other: No additional soft tissue abnormalities are seen. Musculoskeletal: No acute osseous abnormalities are identified. Multilevel vacuum phenomenon is noted along the lower lumbar spine. The visualized musculature is unremarkable in appearance. Review of the MIP images confirms the above findings. IMPRESSION: 1. No evidence of pulmonary embolus. 2. No focal abnormality seen to explain the patient's rectal bleeding. 3. Multifocal right-sided airspace opacification, with underlying crazy paving. Minimal airspace opacity at the left lung apex. This more likely reflects multifocal pneumonia, new from the prior study. Underlying chronic opacities again noted. 4. Mildly enlarged right hilar and mediastinal nodes likely reflect the acute infection. 5. Mild cardiomegaly. 6. Large left renal cyst. Scattered nonspecific hypodensities within the liver may reflect cysts. Aortic Atherosclerosis (ICD10-I70.0). Electronically Signed   By: Garald Balding M.D.    On: 07/08/2017 03:03   Ct Abdomen Pelvis W Contrast  Result Date: 07/08/2017 CLINICAL DATA:  Subacute onset of bright red rectal bleeding. Atrial  fibrillation. Mild hypoxia. Pneumonia. Elevated D-dimer. EXAM: CT ANGIOGRAPHY CHEST CT ABDOMEN AND PELVIS WITH CONTRAST TECHNIQUE: Multidetector CT imaging of the chest was performed using the standard protocol during bolus administration of intravenous contrast. Multiplanar CT image reconstructions and MIPs were obtained to evaluate the vascular anatomy. Multidetector CT imaging of the abdomen and pelvis was performed using the standard protocol during bolus administration of intravenous contrast. CONTRAST:  154mL ISOVUE-370 IOPAMIDOL (ISOVUE-370) INJECTION 76% COMPARISON:  CT of the abdomen performed 03/25/2005, and CTA of the chest performed 06/14/2017 FINDINGS: CTA CHEST FINDINGS Cardiovascular:  There is no evidence of pulmonary embolus. The heart is mildly enlarged. Mild calcification is noted along the thoracic aorta. The great vessels are grossly unremarkable in appearance. A pacemaker is noted at the left chest wall, with leads ending at the right atrium and right ventricle. There is mild prominence of the ascending thoracic aorta to 4.3 cm in AP dimension, which is likely within physiologic limits for the patient's age. Mediastinum/Nodes: Mildly enlarged right hilar and mediastinal nodes are seen, measuring up to 1.2 cm in short axis. Prominent nodes are noted at the right paratracheal region, subcarinal region and azygoesophageal recess. No pericardial effusion is identified. The thyroid gland is unremarkable. No axillary lymphadenopathy is seen. Lungs/Pleura: Multifocal right-sided airspace opacification is noted, with some degree of crazy paving. Minimal opacity is also noted at the left lung apex. This more likely reflects pneumonia. Chronic bibasilar airspace opacities are again noted. No pleural effusion or pneumothorax is seen. Musculoskeletal: No  acute osseous abnormalities are identified. The visualized musculature is unremarkable in appearance. Review of the MIP images confirms the above findings. CT ABDOMEN and PELVIS FINDINGS Hepatobiliary: Scattered nonspecific hypodensities within the liver measure up to 1.9 cm in size. The liver is otherwise unremarkable. The patient is status post cholecystectomy, with clips noted at the gallbladder fossa. The common bile duct is normal in caliber. Pancreas: The pancreas is within normal limits. Spleen: The spleen is unremarkable in appearance. Adrenals/Urinary Tract: The adrenal glands are unremarkable in appearance. A large left renal cyst is noted, with minimal peripheral calcification. There is no evidence of hydronephrosis. No renal or ureteral stones are identified. No perinephric stranding is seen. Stomach/Bowel: The stomach is unremarkable in appearance. The small bowel is within normal limits. The appendix is normal in caliber, without evidence of appendicitis. The colon is unremarkable in appearance. Vascular/Lymphatic: Scattered calcification is seen along the abdominal aorta and its branches. The abdominal aorta is otherwise grossly unremarkable. The inferior vena cava is grossly unremarkable. No retroperitoneal lymphadenopathy is seen. No pelvic sidewall lymphadenopathy is identified. Reproductive: The bladder is mildly distended and grossly unremarkable. The patient is status post hysterectomy. No suspicious adnexal masses are seen. Other: No additional soft tissue abnormalities are seen. Musculoskeletal: No acute osseous abnormalities are identified. Multilevel vacuum phenomenon is noted along the lower lumbar spine. The visualized musculature is unremarkable in appearance. Review of the MIP images confirms the above findings. IMPRESSION: 1. No evidence of pulmonary embolus. 2. No focal abnormality seen to explain the patient's rectal bleeding. 3. Multifocal right-sided airspace opacification, with  underlying crazy paving. Minimal airspace opacity at the left lung apex. This more likely reflects multifocal pneumonia, new from the prior study. Underlying chronic opacities again noted. 4. Mildly enlarged right hilar and mediastinal nodes likely reflect the acute infection. 5. Mild cardiomegaly. 6. Large left renal cyst. Scattered nonspecific hypodensities within the liver may reflect cysts. Aortic Atherosclerosis (ICD10-I70.0). Electronically Signed   By: Jacqulynn Cadet  Chang M.D.   On: 07/08/2017 03:03   Dg Chest Port 1 View  Result Date: 07/13/2017 CLINICAL DATA:  Respiratory failure, intubated patient, community-acquired pneumonia. EXAM: PORTABLE CHEST 1 VIEW COMPARISON:  Portable chest x-ray of July 10, 2017 FINDINGS: The lungs are well-expanded. Confluent airspace opacity has worsened on the right and involves the entire lung. Increased interstitial density on the left has developed as well. The cardiac silhouette remains enlarged. The pulmonary vascularity is mildly engorged and indistinct. There is calcification in the wall of the aortic arch. IMPRESSION: Worsening of bilateral pneumonia. One cannot exclude a component of pulmonary edema in the appropriate clinical setting. Thoracic aortic atherosclerosis. Electronically Signed   By: David  Martinique M.D.   On: 07/13/2017 10:35   Dg Chest Port 1 View  Result Date: 07/10/2017 CLINICAL DATA:  Hypoxia.  Hypertension. EXAM: PORTABLE CHEST 1 VIEW COMPARISON:  CT of 07/08/2017.  Plain film 06/29/2017. FINDINGS: Dual lead pacer. Midline trachea. Cardiomegaly accentuated by AP portable technique. Atherosclerosis in the transverse aorta. Probable small right pleural effusion. Worsened right upper lobe airspace disease. Bibasilar scarring. No congestive failure. IMPRESSION: Worsened right upper lobe airspace disease, consistent with pneumonia. Aortic Atherosclerosis (ICD10-I70.0). Electronically Signed   By: Abigail Miyamoto M.D.   On: 07/10/2017 07:54     Microbiology: Recent Results (from the past 240 hour(s))  Blood culture (routine x 2)     Status: None   Collection Time: 07/08/17  1:10 AM  Result Value Ref Range Status   Specimen Description   Final    BLOOD LEFT ARM Performed at [facility], [address] [place], [place] [address]    Special Requests   Final    BOTTLES DRAWN AEROBIC AND ANAEROBIC Blood Culture adequate volume Performed at [facility], [address] [place], [place] [address]    Culture   Final    NO GROWTH 5 DAYS Performed at [facility], [place] [address], [place], [place] [address]    Report Status 07/13/2017 FINAL  Final  Blood culture (routine x 2)     Status: None   Collection Time: 07/08/17  1:33 AM  Result Value Ref Range Status   Specimen Description   Final    BLOOD LEFT ARM Performed at [facility], [address] [place], [place] [address]    Special Requests   Final    BOTTLES DRAWN AEROBIC AND ANAEROBIC Blood Culture adequate volume Performed at [facility], [address] [place], [place] [address]    Culture   Final    NO GROWTH 5 DAYS Performed at [facility], [place] [address] [place], [place] [address]    Report Status 07/13/2017 FINAL  Final  MRSA PCR Screening     Status: None   Collection Time: 07/08/17  3:38 AM  Result Value Ref Range Status   MRSA by PCR NEGATIVE NEGATIVE Final    Comment:        The GeneXpert MRSA Assay (FDA approved for NASAL specimens only), is one component of a comprehensive MRSA colonization surveillance program. It is not intended to diagnose MRSA infection nor to guide or monitor treatment for MRSA infections. Performed at [facility], [place] [address], [place], [place] [address]   Culture, sputum-assessment     Status: None   Collection Time: 07/08/17 11:38 AM  Result Value Ref Range Status   Specimen Description SPUTUM  Final  Special Requests Normal   Final   Sputum evaluation   Final    Sputum specimen not acceptable for testing.  Please recollect.   Performed at [facility], [place] [address] [place], [place] [address]    Report Status 07/08/2017 FINAL  Final  Culture, blood (routine x 2)     Status: None (Preliminary result)   Collection Time: 07/10/17  7:48 AM  Result Value Ref Range Status   Specimen Description   Final    BLOOD RIGHT HAND Performed at [place] [address], [place], [place] [address]    Special Requests   Final    BOTTLES DRAWN AEROBIC ONLY Blood Culture adequate volume Performed at [place] [address], [place], [place] [address]    Culture   Final    NO GROWTH 4 DAYS Performed at [facility], [place] [address] [place], [place] [address]    Report Status PENDING  Incomplete  Culture, blood (routine x 2)     Status: None (Preliminary result)   Collection Time: 07/10/17  8:05 AM  Result Value Ref Range Status   Specimen Description   Final    BLOOD LEFT HAND Performed at [place] [address], [place], [place] [address]    Special Requests   Final    BOTTLES DRAWN AEROBIC AND ANAEROBIC Blood Culture adequate volume Performed at [place] [address], [place], [place] [address]    Culture   Final    NO GROWTH 4 DAYS Performed at [facility], [place] [address], [place], [place] [address]    Report Status PENDING  Incomplete  Respiratory Panel by PCR     Status: None   Collection Time: 07/10/17 12:22 PM  Result Value Ref Range Status   Adenovirus NOT DETECTED NOT DETECTED Final   Coronavirus 229E NOT DETECTED NOT DETECTED Final   Coronavirus HKU1 NOT DETECTED NOT DETECTED Final   Coronavirus NL63 NOT DETECTED NOT DETECTED Final   Coronavirus OC43 NOT DETECTED NOT DETECTED Final   Metapneumovirus NOT DETECTED NOT DETECTED Final   Rhinovirus / Enterovirus NOT DETECTED  NOT DETECTED Final   Influenza A NOT DETECTED NOT DETECTED Final   Influenza B NOT DETECTED NOT DETECTED Final   Parainfluenza Virus 1 NOT DETECTED NOT DETECTED Final   Parainfluenza Virus 2 NOT DETECTED NOT DETECTED Final   Parainfluenza Virus 3 NOT DETECTED NOT DETECTED Final   Parainfluenza Virus 4 NOT DETECTED NOT DETECTED Final   Respiratory Syncytial Virus NOT DETECTED NOT DETECTED Final   Bordetella pertussis NOT DETECTED NOT DETECTED Final   Chlamydophila pneumoniae NOT DETECTED NOT DETECTED Final   Mycoplasma pneumoniae NOT DETECTED NOT DETECTED Final    Comment: Performed at [facility], [place] [address], [place], [place] [address]  Culture, Urine     Status: Abnormal   Collection Time: 07/10/17  5:14 PM  Result Value Ref Range Status   Specimen Description   Final    URINE, RANDOM Performed at [place] [address] [place], [place] [address]    Special Requests   Final    NONE Performed at [facility], [place] [address] [place], [place] [address]    Culture (A)  Final    <10,000 COLONIES/mL INSIGNIFICANT GROWTH Performed at [place] [address], [place],  [address]    Report Status 07/12/2017 FINAL  Final  Culture, blood (Routine X 2) w Reflex to ID Panel     Status: None (Preliminary result)   Collection Time: 07/12/17 12:44 PM  Result Value Ref Range Status   Specimen Description   Final    BLOOD RIGHT HAND Performed at [place] [address], [place], [place] [address]    Special Requests   Final    AEROBIC BOTTLE ONLY Blood Culture adequate volume Performed at [place] [address], [place], [place] [address]    Culture   Final    NO GROWTH 2 DAYS Performed at [place] [address] [place], [place] [address]    Report Status PENDING  Incomplete  Culture, blood (Routine X 2) w Reflex to ID Panel     Status: None (Preliminary  result)   Collection Time: 07/12/17 12:44 PM  Result Value Ref Range Status   Specimen Description   Final    BLOOD RIGHT HAND Performed at [place] [address], [place], [place] [address]    Special Requests   Final    AEROBIC BOTTLE ONLY Blood Culture results may not be optimal due to an inadequate volume of blood received in culture bottles Performed at [place] [address] [place], [place] [address]    Culture   Final    NO GROWTH 2 DAYS Performed at [place] [address], [place],  [address]    Report Status PENDING  Incomplete     Labs: CBC: Recent Labs  Lab 07/10/17 0336 07/11/17 0402 07/12/17 0326 07/13/17 0305 Aug 03, 2017 0319  WBC 11.2* 11.4* 14.3* 15.0* 15.7*  NEUTROABS 9.3* 9.5* 12.4* 13.4* 14.2*  HGB 11.1* 10.6* 10.8* 10.7* 11.2*  HCT 32.6* 30.5* 32.1* 32.6* 34.1*  MCV 90.8 90.5 90.9 92.1 91.4  PLT 240 225 233 250 973   Basic Metabolic Panel: Recent Labs  Lab 07/10/17 0336 07/11/17 0402 07/12/17 0326 07/13/17 0305 Aug 03, 2017 0319  NA 141 144 144 147* 146*  K 3.5 3.6 3.4* 3.5 3.1*  CL 110 111 107 104 101  CO2 21* 24 24 30 31   GLUCOSE 128* 133* 146* 114* 113*  BUN 11 15 15 18  30*  CREATININE 0.66 0.53 0.63 0.62 0.66  CALCIUM 8.8* 9.4 9.5 9.7 10.3  MG  --   --   --  1.7 2.2  PHOS  --   --   --  3.7  --    Liver Function Tests: Recent Labs  Lab 07/17/2017 2158  AST 35  ALT 23  ALKPHOS 125  BILITOT 1.1  PROT 7.1  ALBUMIN 3.0*   BNP (last 3 results) Recent Labs    09/19/16 2218  BNP 124.1*   CBG: No results for input(s): GLUCAP in the last 168 hours. Time spent: 35 minutes  Signed:  Berle Mull  [facility] 08-03-17, 3:16 PM

## 2017-07-20 NOTE — Progress Notes (Signed)
Wasted 66mls of Morphine gtt via sink with Bri McNabb RN as a witness.

## 2017-07-20 NOTE — Progress Notes (Signed)
OT Cancellation Note  Patient Details Name: Molly Taylor MRN: [id number] DOB: 10-26-1926   Cancelled Treatment:    Reason Eval/Treat Not Completed: Medical issues which prohibited therapy. Pt remains on bipap. Please reorder when pt is weaned and can tolerate OT  Thank you.  Maryln Eastham 07/27/2017, 8:26 AM  Lesle Chris, OTR/L [phone] Jul 27, 2017

## 2017-07-20 NOTE — Progress Notes (Signed)
Patient removed from BiPAP for comfort care. BiPAP removed from room and patient family at bedside. RT available as needed.

## 2017-07-20 NOTE — Progress Notes (Signed)
PULMONARY / CRITICAL CARE MEDICINE   Name: Molly Taylor MRN: [id number] DOB: April 07, 1926    ADMISSION DATE:  07/09/2017 CONSULTATION DATE: 07/12/2017  REFERRING MD:  Horris Latino  CHIEF COMPLAINT: Respiratory Failure  HISTORY OF PRESENT ILLNESS:   ChristineJonesis a37 y.o.Taylor with history of hypertension, hyperlipidemia, thoracic aortic aneurysm, tachy-brady, P.afib on eliquis, GERD presents to the ED, c/o worsening productive cough, SOB, fatigue for the past 3 weeks, denies any fever/chills, chest pain. Pt also reported having black stools about several weeks ago, which have now resolved.  In the ED pt noted to be hypoxic on RA with sats in the 80s, FOBT was noted to be negative, CTA chest done showed multifocal PNA, no PE noted. Pt admitted 4/18 for further management.She was treated with Cefipime, Vanc and Azithro for CAP.Vanc was discontinued after sputum culture resulted.Pt. Has continued to decline with CXR with worsening RUL pneumonia, Increased WOB. PCCM has been asked to assist with care.   SUBJECTIVE:  Patient is unresponsive this AM on BiPAP  VITAL SIGNS: BP (!) 136/54   Pulse 87   Temp 99 F (37.2 C) (Axillary)   Resp (!) 32   Ht 5\' 5"  (1.651 m)   Wt 173 lb 4.5 oz (78.6 kg)   SpO2 91%   BMI 28.84 kg/m   HEMODYNAMICS:    VENTILATOR SETTINGS: FiO2 (%):  [100 %] 100 %  INTAKE / OUTPUT: I/O last 3 completed shifts: In: 1170 [P.O.:220; IV Piggyback:950] Out: 2800 [Urine:2800]  PHYSICAL EXAMINATION: General: Unresponsive on BiPAP, acutely ill appearing Neuro: Unresponsive, not withdrawing to pain HEENT: [place]/AT, PERRL, EOM-spontaneous and DMM Cardiovascular: S1-S2 regular rate and rhythm  Lungs: Decreased BS diffusely, on BiPAP Abdomen: Soft, NT, ND and +BS Musculoskeletal: Warm and dry, scant BLE edema  LABS:  BMET Recent Labs  Lab 07/12/17 0326 07/13/17 0305 2017-07-30 0319  NA 144 147* 146*  K 3.4* 3.5 3.1*  CL 107 104 101  CO2 24 30 31   BUN  15 18 30*  CREATININE 0.63 0.62 0.66  GLUCOSE 146* 114* 113*   Electrolytes Recent Labs  Lab 07/12/17 0326 07/13/17 0305 30-Jul-2017 0319  CALCIUM 9.5 9.7 10.3  MG  --  1.7 2.2  PHOS  --  3.7  --     CBC Recent Labs  Lab 07/12/17 0326 07/13/17 0305 2017-07-30 0319  WBC 14.3* 15.0* 15.7*  HGB 10.8* 10.7* 11.2*  HCT 32.1* 32.6* 34.1*  PLT 233 250 255    Coag's No results for input(s): APTT, INR in the last 168 hours.  Sepsis Markers Recent Labs  Lab 07/08/17 0108  07/10/17 1021 07/12/17 1244 07/13/17 0305 30-Jul-2017 0319  LATICACIDVEN 1.06  --  1.1  --   --   --   PROCALCITON  --    < >  --  0.19 0.44 0.57   < > = values in this interval not displayed.    ABG Recent Labs  Lab 07/10/17 0910 07/11/17 1550 07/13/17 0500  PHART 7.462* 7.365 7.483*  PCO2ART 32.3 39.5 44.5  PO2ART 54.3* 53.4* 64.3*    Liver Enzymes Recent Labs  Lab 06/24/2017 2158  AST 35  ALT 23  ALKPHOS 125  BILITOT 1.1  ALBUMIN 3.0*    Cardiac Enzymes No results for input(s): TROPONINI, PROBNP in the last 168 hours.  Glucose No results for input(s): GLUCAP in the last 168 hours.  Imaging No results found.   STUDIES:  CTA  07/08/2017>>No PE, Multifocal right-sided airspace opacification, with underlying crazy paving.  Minimal airspace opacity at the left lung apex. This more likely reflects multifocal pneumonia, new from the prior study. Underlying chronic opacities again noted. Mildly enlarged right hilar and mediastinal nodes likely reflect the acute infection. Mild cardiomegaly.   CXR 4/23>>Worsened right upper lobe airspace disease, consistent with pneumonia.  CULTURES: 4/21 Viral panel negative 07/12/2017>> Blood   ANTIBIOTICS: Azithro>> 4/20>> Rocephin>>4/20-4/21 Maxipine>>4/21-4/23 Merrem>> 4/23>> Vanc>> 4/23>>  SIGNIFICANT EVENTS: 4/18 Admission for pneumonia 4/23>> PCCM consulted for worsening pneumonia  LINES/TUBES: PIV x 1  DISCUSSION: Molly Taylor with history of hypertension, tachybradycardia syndrome, A. fib, GERD admitted 4/18 With community acquired pneumonia.  Has had worsening respiratory status over the last several days, now requiring continuous BiPAP.  Per family discussions patient is now limited code, DNR.  BiPAP only.   ASSESSMENT / PLAN:  PULMONARY A: Acute hypoxemic respiratory failure Multi-focal pneumonia P:   Remove BiPAP to comfort care this AM  CARDIOVASCULAR A:  Essential hypertension Tachycardia from respiratory failure PAF P:  D/C tele D/C medications  RENAL A:   Hypokalemia Hyponatremia P:   D/C further blood draws  GASTROINTESTINAL A:   No active issues P:   NPO  HEMATOLOGIC A:   Leukocytosis P:  D/C further blood draws  INFECTIOUS A:   Multi-focal CAP P:   D/C abx  ENDOCRINE A:   No active issues   P:   D/C CBGs and SSI  NEUROLOGIC A:   Altered mental status-related to respiratory failure P:    Morphine for comfort  FAMILY  - Updates:  Spoke with family at length, after discussion, decision was made to proceed with comfort measures as patient is no longer responding to treatment.  Will start a morphine drip and D/C BiPAP  - Inter-disciplinary family meet or Palliative Care meeting due by:  day 7  The patient is critically ill with multiple organ systems failure and requires high complexity decision making for assessment and support, frequent evaluation and titration of therapies, application of advanced monitoring technologies and extensive interpretation of multiple databases.   Critical Care Time devoted to patient care services described in this note is  35  Minutes. This time reflects time of care of this signee Dr Jennet Maduro. This critical care time does not reflect procedure time, or teaching time or supervisory time of PA/NP/Med student/Med Resident etc but could involve care discussion time.  [name], M.D. [facility] Pulmonary/Critical  Care Medicine. Pager: [phone]. After hours pager: [phone].

## 2017-07-20 DEATH — deceased

## 2017-08-02 ENCOUNTER — Ambulatory Visit: Payer: Medicare Other | Admitting: Emergency Medicine

## 2017-12-15 ENCOUNTER — Other Ambulatory Visit (HOSPITAL_COMMUNITY): Payer: Medicare Other

## 2019-03-24 IMAGING — CR DG CHEST 2V
2 series · 2 of 2 positions shown · non-contrast
Comparison: 09/06/2016.

CLINICAL DATA: Cough.  Shortness of breath.

EXAM:
CHEST  2 VIEW

[w chest pa]
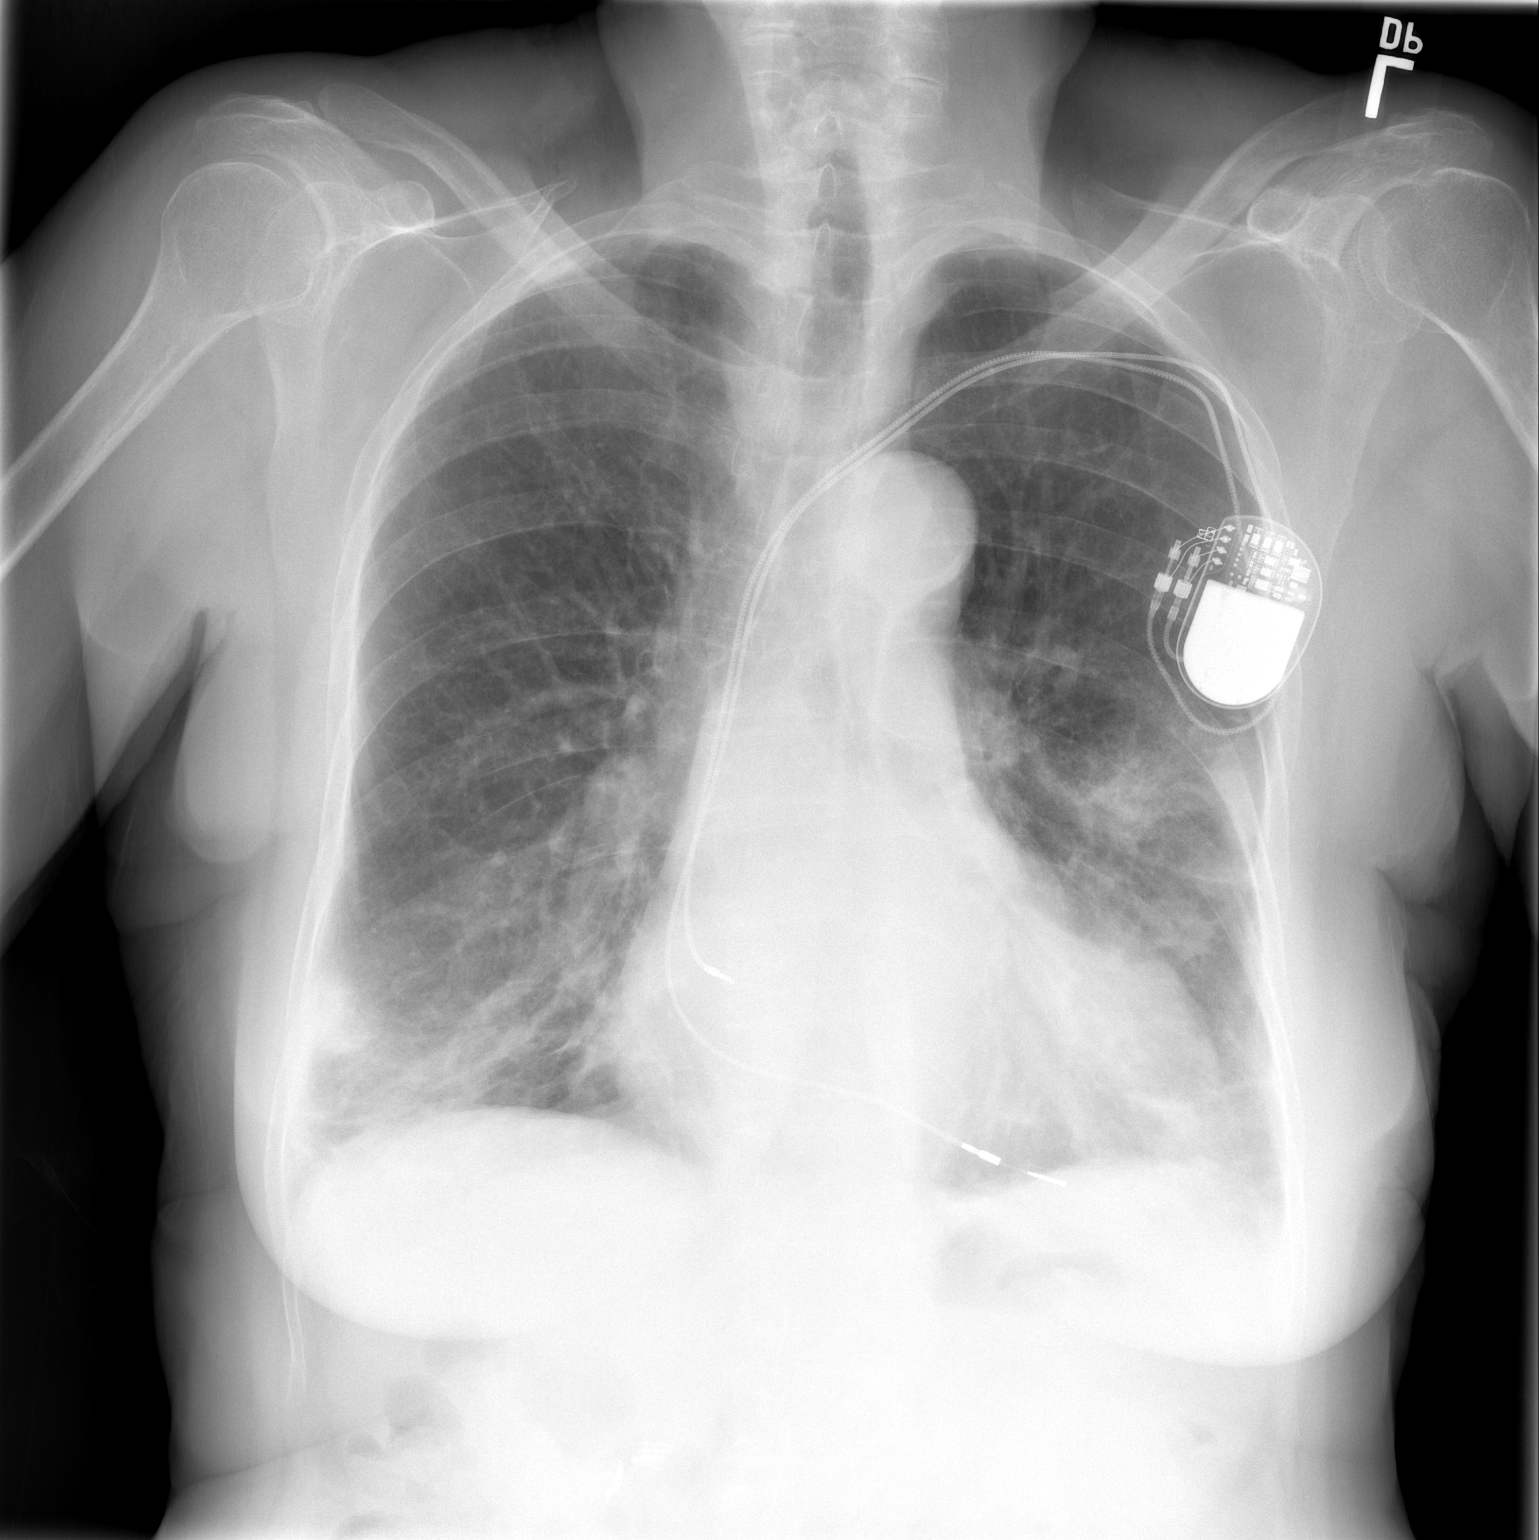

[w chest lat]
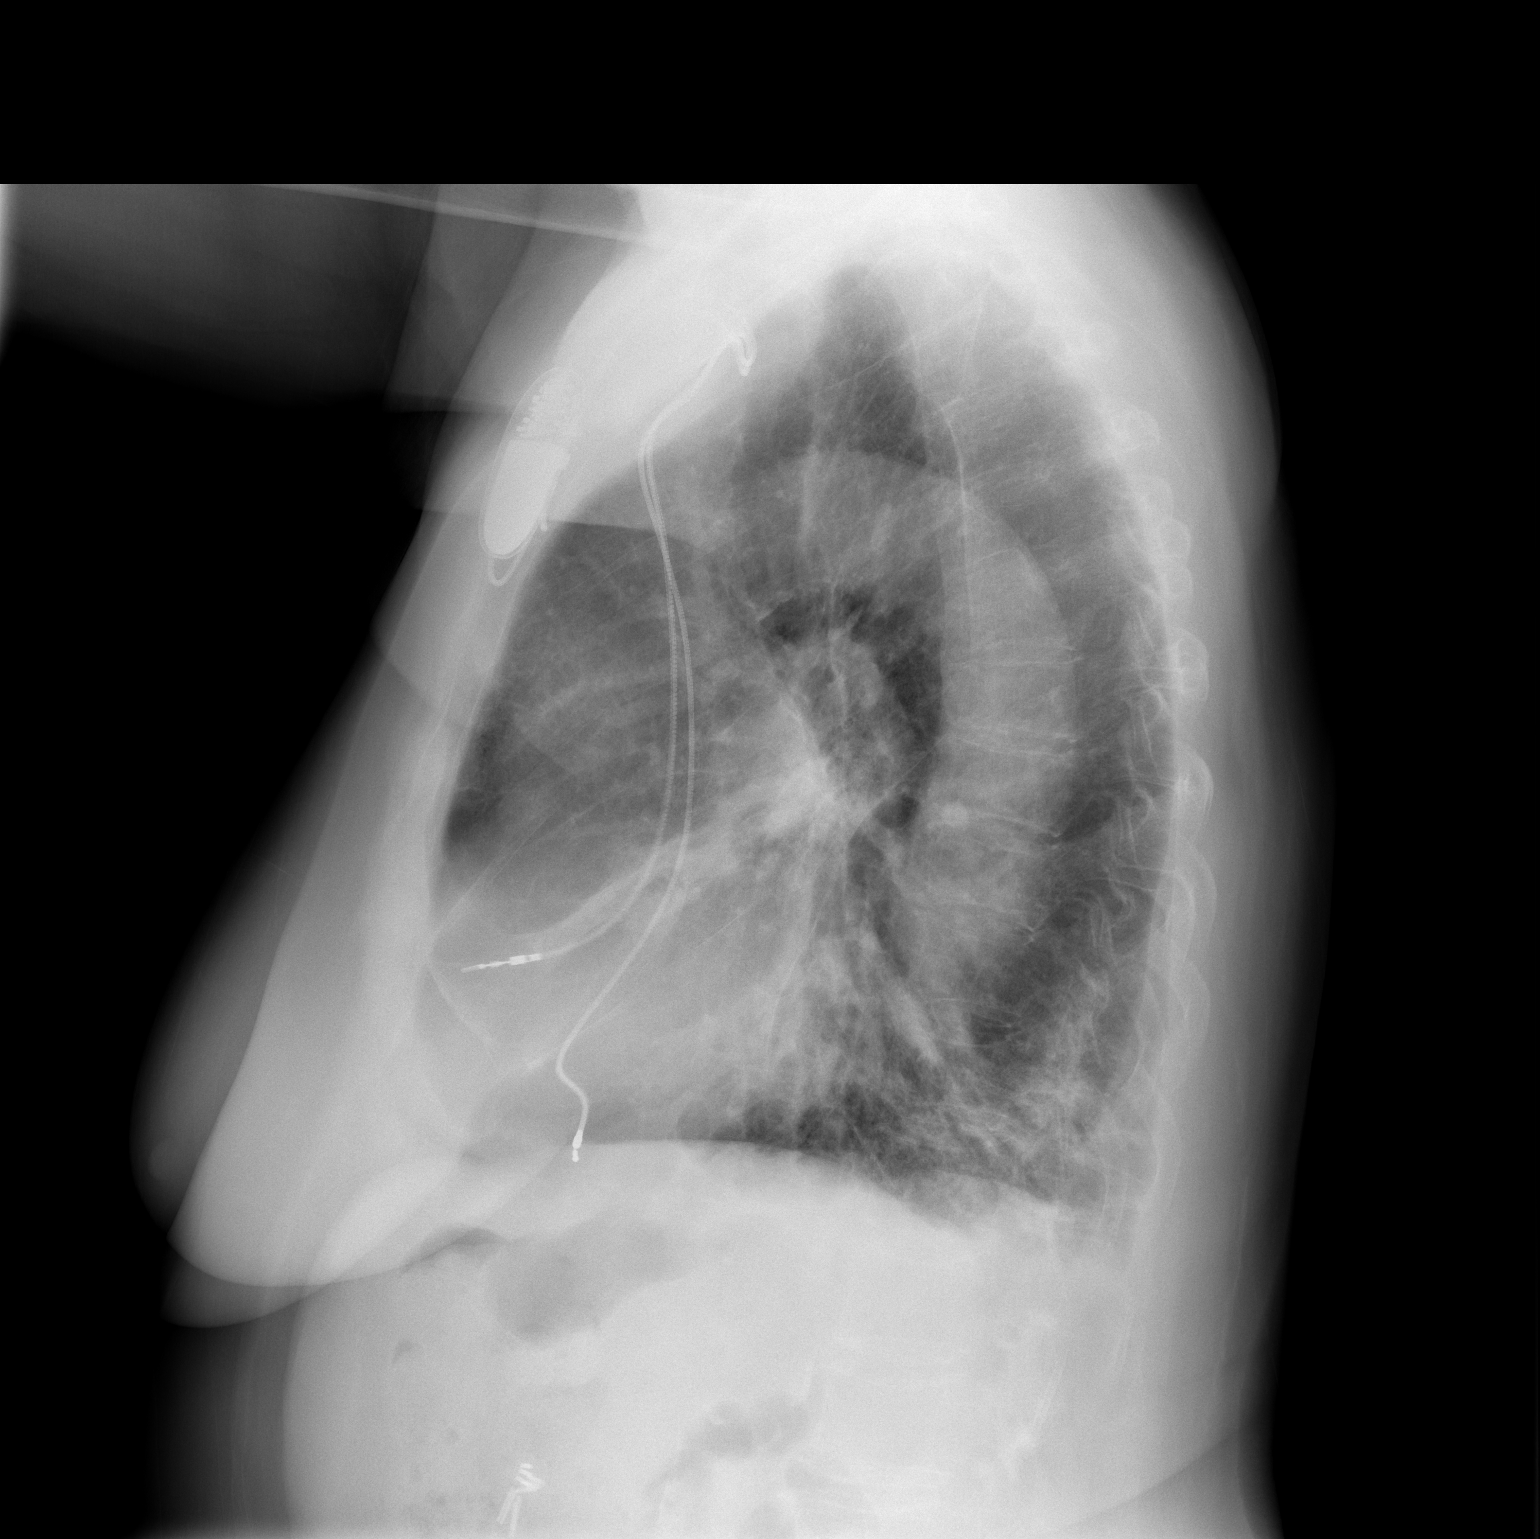

[2 of 2 positions shown; findings below may reference images not displayed]

FINDINGS: Cardiac pacer with lead tips in the right atrium and right
ventricle. Cardiomegaly with normal pulmonary vascularity. Bilateral
pulmonary infiltrates with small pleural effusions noted. Pulmonary
infiltrates have progressed from prior exam. Surgical clips right
upper quadrant.
IMPRESSION: 1. Progressive bilateral pulmonary infiltrates and subsegmental
atelectasis. Small bilateral pleural effusions.

2. Cardiac pacer in stable position.  Stable cardiomegaly.

## 2019-03-28 IMAGING — CT CT ANGIO CHEST
2 of 6 series · 18 of 46 positions shown · IV contrast (APPLIED)
Comparison: Chest radiograph 09/19/2016

CLINICAL DATA: Dyspnea.  History of renal cell carcinoma.

EXAM:
CT ANGIOGRAPHY CHEST WITH CONTRAST
TECHNIQUE: Multidetector CT imaging of the chest was performed using the
standard protocol during bolus administration of intravenous
contrast. Multiplanar CT image reconstructions and MIPs were
obtained to evaluate the vascular anatomy.
CONTRAST:  100 cc Isovue 370 intravenously

[Series 5: thins · axial · 0.70mm/px · z∈[-345,-92]mm · 16 of 279 slices shown]
[im 13/279  lung]
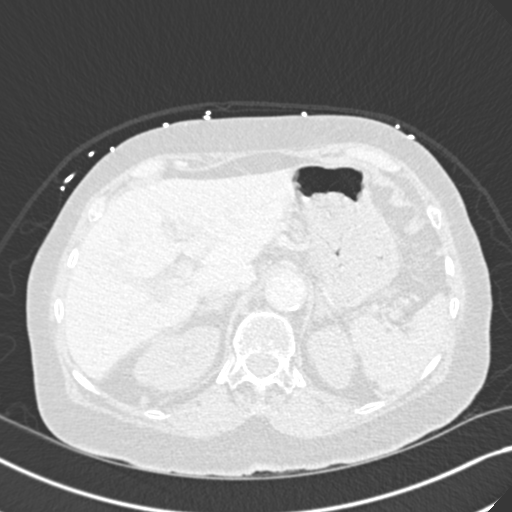
[im 37/279  soft-tissue]
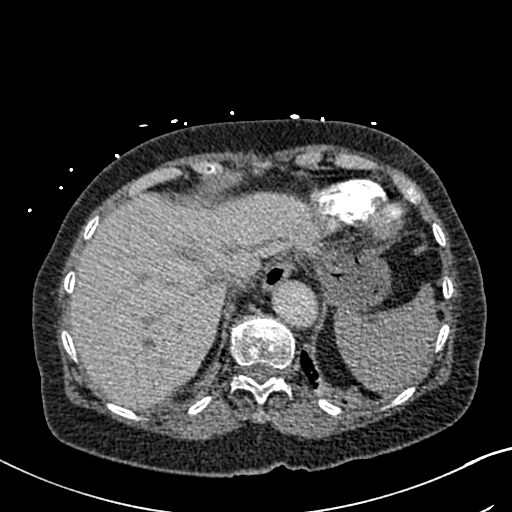
[im 49/279  lung]
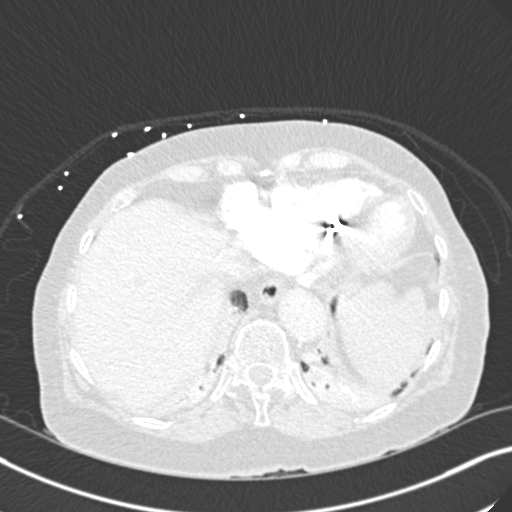
[im 61/279  soft-tissue]
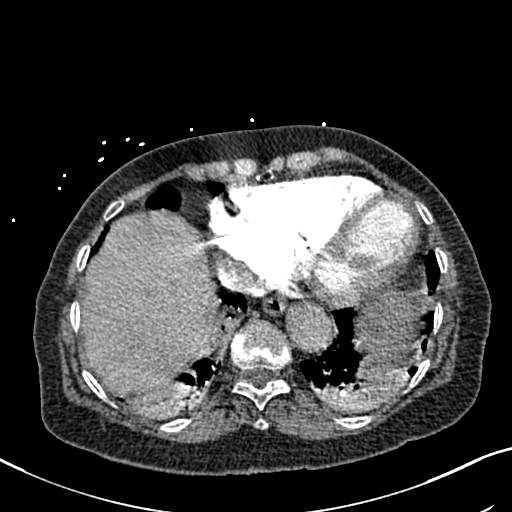
[im 85/279  lung]
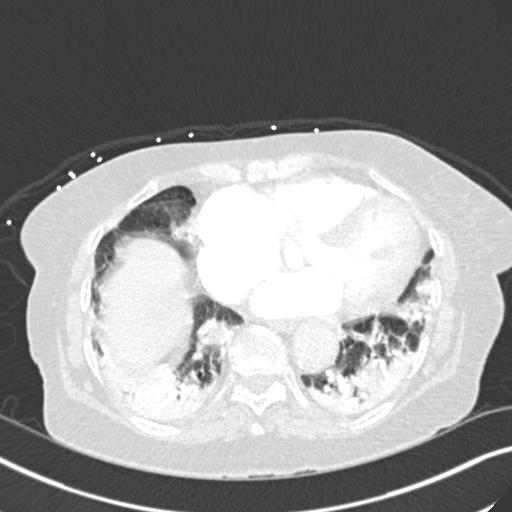
[im 97/279  soft-tissue]
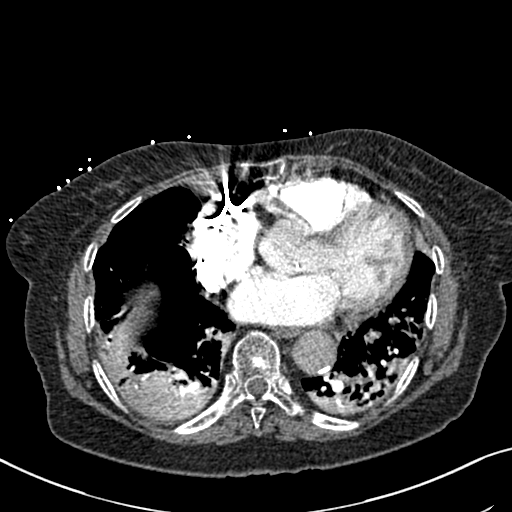
[im 109/279  lung]
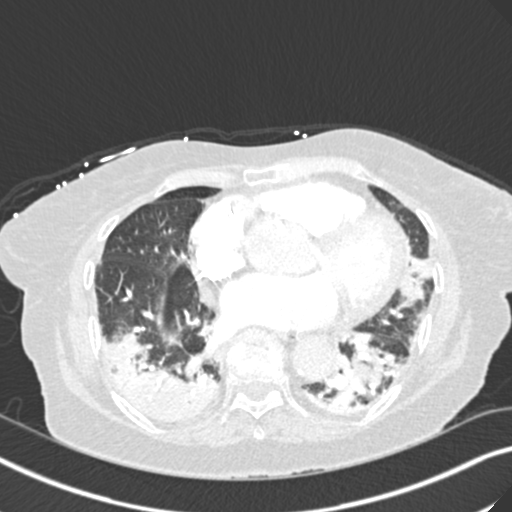
[im 133/279  soft-tissue]
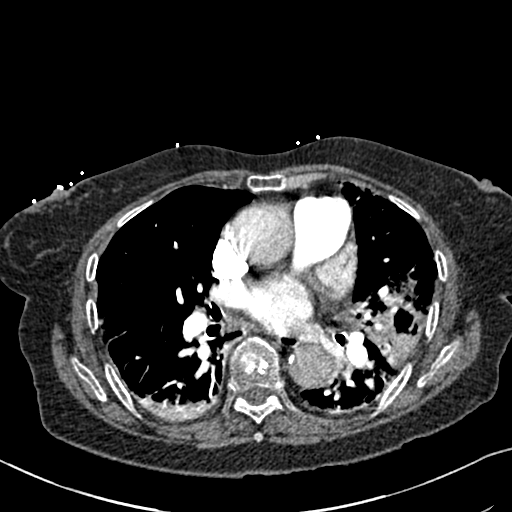
[im 146/279  lung]
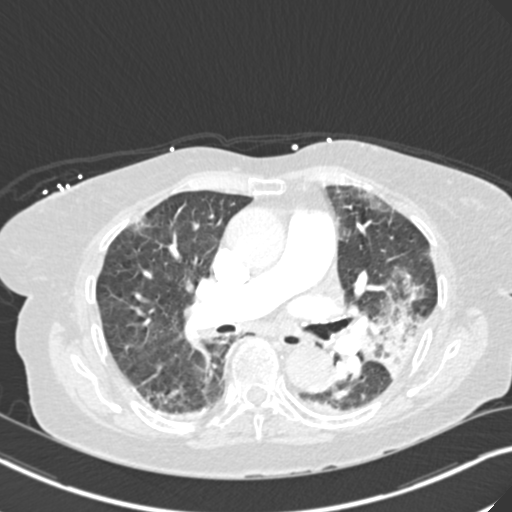
[im 170/279  soft-tissue]
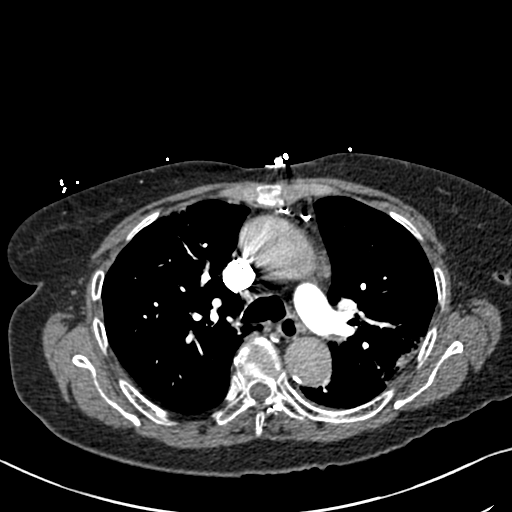
[im 182/279  lung]
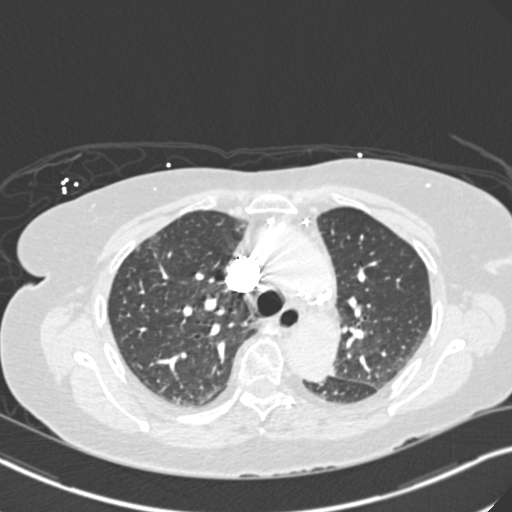
[im 194/279  soft-tissue]
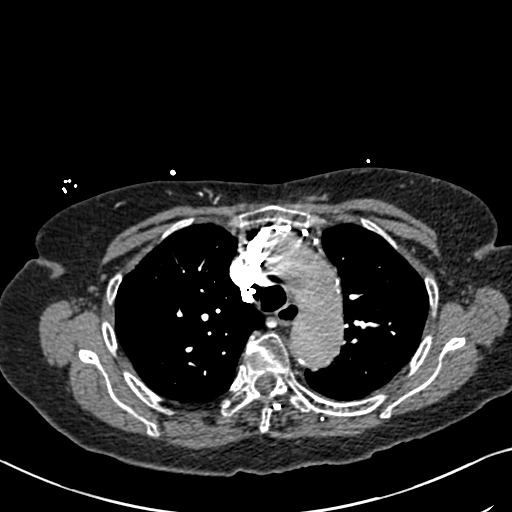
[im 218/279  lung]
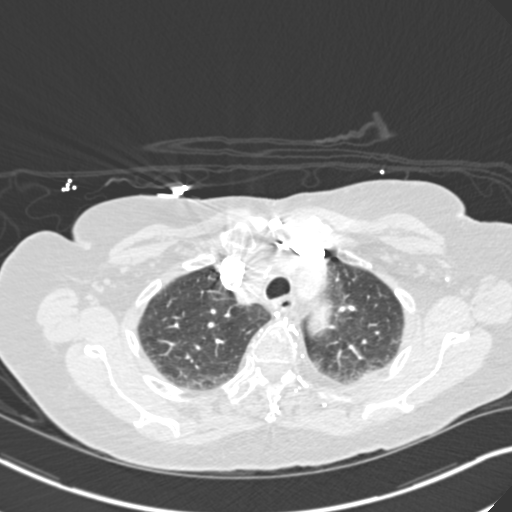
[im 230/279  soft-tissue]
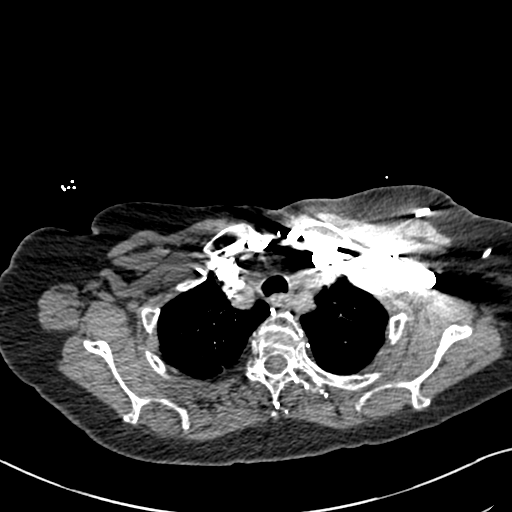
[im 242/279  lung]
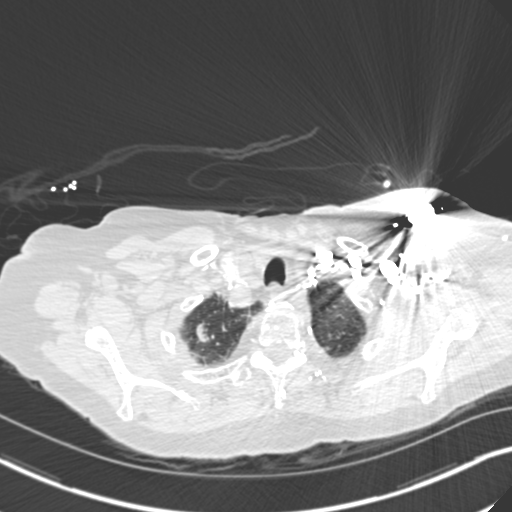
[im 266/279  soft-tissue]
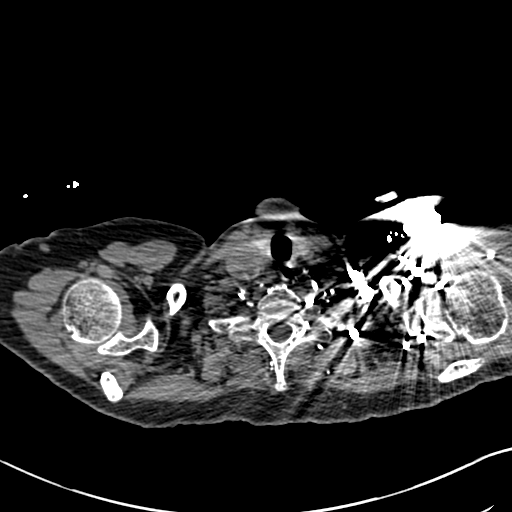

[Series 6: coronal mpr · coronal · 0.54mm/px · 2 of 79 slices shown]
[im 27/79  soft-tissue]
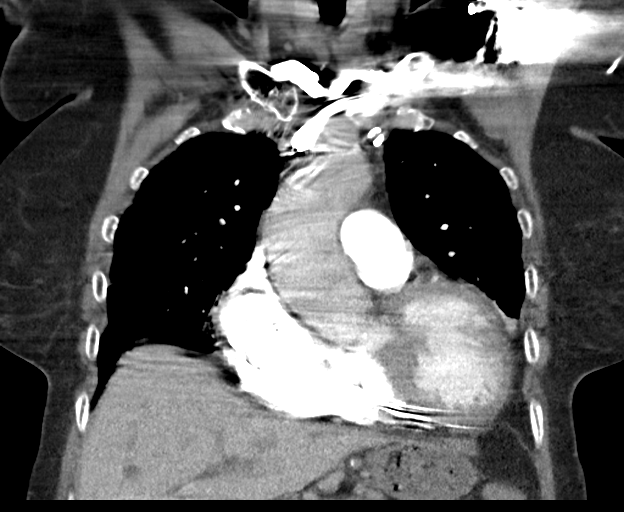
[im 53/79  soft-tissue]
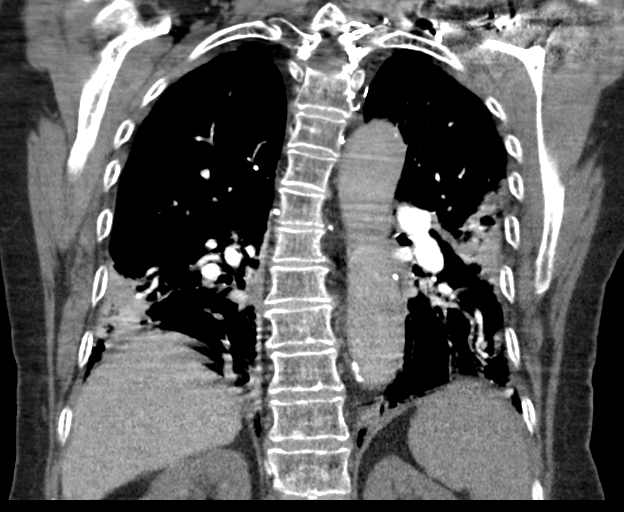

[18 of 46 positions shown; findings below may reference images not displayed]

FINDINGS: Cardiovascular: Satisfactory opacification of the pulmonary arteries
to the segmental level. No evidence of pulmonary embolism. Enlarged
heart. No pericardial effusion. Calcific atherosclerotic disease of
the aorta and coronary arteries. Mild dilation of the ascending
aorta measuring 4.2 cm in greatest dimension.

Mediastinum/Nodes: No enlarged mediastinal, hilar, or axillary lymph
nodes. Thyroid gland, trachea, and esophagus demonstrate no
significant findings.

Lungs/Pleura: Multifocal patchy airspace consolidation in lingula
and bilateral lower lobes. Additional areas of ground-glass
consolidation in bilateral upper lobes.

Upper Abdomen: No acute abnormality.

Musculoskeletal: No chest wall abnormality. No acute or significant
osseous findings. Cardiac pacemaker within the left chest wall with
intact leads.

Review of the MIP images confirms the above findings.
IMPRESSION: Bilateral lower lobe patchy airspace consolidation which may
represent multilobar pneumonia. Aspiration pneumonia is also on the
differential diagnosis.

Additional nonspecific areas of ground-glass opacity in bilateral
upper lobes. In the acute clinical settings, these may represent
infectious or inflammatory consolidation. However follow-up is
recommended after resolution of the acute symptoms.

No evidence of pulmonary embolus.

Mild dilation of the ascending aorta measuring 4.2 cm. Recommend
annual imaging followup by CTA or MRA. This recommendation follows
5303 ACCF/AHA/AATS/ACR/ASA/SCA/LOCKLEAR/TARLA/[place]VILL[place]FF[place] Guidelines for the
Diagnosis and Management of Patients with Thoracic Aortic Disease.
Circulation. 5303; 121: [id number]
